# Patient Record
Sex: Male | Born: 1945 | Race: White | Hispanic: No | Marital: Single | Smoking: Former smoker
Health system: Southern US, Community
[De-identification: ages and names within clinical notes are randomized; demographics above are authoritative.]

## PROBLEM LIST (undated history)

## (undated) DIAGNOSIS — I4891 Unspecified atrial fibrillation: Secondary | ICD-10-CM

## (undated) DIAGNOSIS — E119 Type 2 diabetes mellitus without complications: Secondary | ICD-10-CM

## (undated) DIAGNOSIS — M109 Gout, unspecified: Secondary | ICD-10-CM

## (undated) DIAGNOSIS — E785 Hyperlipidemia, unspecified: Secondary | ICD-10-CM

## (undated) DIAGNOSIS — I639 Cerebral infarction, unspecified: Secondary | ICD-10-CM

## (undated) DIAGNOSIS — E669 Obesity, unspecified: Secondary | ICD-10-CM

## (undated) DIAGNOSIS — M1990 Unspecified osteoarthritis, unspecified site: Secondary | ICD-10-CM

## (undated) HISTORY — PX: ABDOMINAL SURGERY: SHX537

## (undated) HISTORY — DX: Unspecified atrial fibrillation: I48.91

## (undated) HISTORY — DX: Type 2 diabetes mellitus without complications: E11.9

## (undated) HISTORY — DX: Gout, unspecified: M10.9

## (undated) HISTORY — DX: Cerebral infarction, unspecified: I63.9

## (undated) HISTORY — DX: Hyperlipidemia, unspecified: E78.5

## (undated) HISTORY — DX: Unspecified osteoarthritis, unspecified site: M19.90

## (undated) HISTORY — DX: Obesity, unspecified: E66.9

## (undated) HISTORY — PX: ANKLE SURGERY: SHX546

---

## 2005-12-14 ENCOUNTER — Inpatient Hospital Stay (HOSPITAL_COMMUNITY): Admission: EM | Admit: 2005-12-14 | Discharge: 2005-12-19 | Payer: Self-pay | Admitting: Emergency Medicine

## 2011-07-22 ENCOUNTER — Ambulatory Visit (INDEPENDENT_AMBULATORY_CARE_PROVIDER_SITE_OTHER): Payer: Federal, State, Local not specified - PPO

## 2011-07-22 DIAGNOSIS — J209 Acute bronchitis, unspecified: Secondary | ICD-10-CM

## 2011-07-22 DIAGNOSIS — F411 Generalized anxiety disorder: Secondary | ICD-10-CM

## 2011-11-30 ENCOUNTER — Encounter: Payer: Self-pay | Admitting: Internal Medicine

## 2012-01-18 ENCOUNTER — Ambulatory Visit (INDEPENDENT_AMBULATORY_CARE_PROVIDER_SITE_OTHER): Payer: Federal, State, Local not specified - PPO | Admitting: Internal Medicine

## 2012-01-18 ENCOUNTER — Encounter: Payer: Self-pay | Admitting: Internal Medicine

## 2012-01-18 VITALS — BP 141/89 | HR 77 | Temp 98.3°F | Resp 16 | Ht 66.0 in | Wt 274.4 lb

## 2012-01-18 DIAGNOSIS — E785 Hyperlipidemia, unspecified: Secondary | ICD-10-CM

## 2012-01-18 DIAGNOSIS — Z0000 Encounter for general adult medical examination without abnormal findings: Secondary | ICD-10-CM

## 2012-01-18 DIAGNOSIS — M109 Gout, unspecified: Secondary | ICD-10-CM

## 2012-01-18 DIAGNOSIS — Z79899 Other long term (current) drug therapy: Secondary | ICD-10-CM

## 2012-01-18 DIAGNOSIS — E669 Obesity, unspecified: Secondary | ICD-10-CM

## 2012-01-18 DIAGNOSIS — G4700 Insomnia, unspecified: Secondary | ICD-10-CM

## 2012-01-18 LAB — POCT URINALYSIS DIPSTICK
Blood, UA: NEGATIVE
Protein, UA: NEGATIVE
Spec Grav, UA: 1.02
Urobilinogen, UA: 1
pH, UA: 5.5

## 2012-01-18 LAB — POCT UA - MICROSCOPIC ONLY
Casts, Ur, LPF, POC: NEGATIVE
Crystals, Ur, HPF, POC: NEGATIVE
Yeast, UA: NEGATIVE

## 2012-01-18 MED ORDER — ALPRAZOLAM 0.5 MG PO TABS: 0.5000 mg | ORAL_TABLET | Freq: Every evening | ORAL | Status: DC | PRN

## 2012-01-18 NOTE — Progress Notes (Signed)
  Subjective:    Patient ID: Aaron Moon, male    DOB: 1946-08-09, 66 y.o.   MRN: [id number]  HPI Doing well. Has pain in left leg from a strain left knee. Improving Lipids are ok and tolerates meds. Obesity no better. See scanned hx   Review of Systems See scanned ros    Objective:   Physical Exam  Constitutional: He is oriented to person, place, and time. He appears well-nourished. No distress.  HENT:  Right Ear: External ear normal.  Left Ear: External ear normal.  Nose: Nose normal.  Mouth/Throat: Oropharynx is clear and moist.  Eyes: EOM are normal. Pupils are equal, round, and reactive to light.  Neck: Normal range of motion. Neck supple. No thyromegaly present.  Cardiovascular: Normal rate and regular rhythm.   Pulmonary/Chest: Effort normal and breath sounds normal.  Abdominal: Bowel sounds are normal. He exhibits no mass. There is no tenderness.  Genitourinary: Prostate normal and penis normal.  Musculoskeletal: Normal range of motion. He exhibits tenderness.  Neurological: He is alert and oriented to person, place, and time. He has normal reflexes. Coordination normal.  Skin: Skin is warm. Rash noted.  Psychiatric: He has a normal mood and affect. His behavior is normal.      Results for orders placed in visit on 01/18/12  GLUCOSE, POCT (MANUAL RESULT ENTRY)      Component Value Range   POC Glucose 92  70 - 99 (mg/dl)  POCT GLYCOSYLATED HEMOGLOBIN (HGB A1C)      Component Value Range   Hemoglobin A1C 6.0    POCT UA - MICROSCOPIC ONLY      Component Value Range   WBC, Ur, HPF, POC 0-4     RBC, urine, microscopic 0-3     Bacteria, U Microscopic neg     Mucus, UA trace     Epithelial cells, urine per micros 0-tntc     Crystals, Ur, HPF, POC neg     Casts, Ur, LPF, POC neg     Yeast, UA neg    POCT URINALYSIS DIPSTICK      Component Value Range   Color, UA yellow     Clarity, UA clear     Glucose, UA neg     Bilirubin, UA neg     Ketones, UA neg     Spec Grav, UA 1.020     Blood, UA neg     pH, UA 5.5     Protein, UA neg     Urobilinogen, UA 1.0     Nitrite, UA neg     Leukocytes, UA Trace         Assessment & Plan:  RF meds 1 yr

## 2012-01-19 LAB — CBC WITH DIFFERENTIAL/PLATELET
Basophils Relative: 0 % (ref 0–1)
HCT: 41.5 % (ref 39.0–52.0)
MCH: 29.3 pg (ref 26.0–34.0)
MCHC: 33.7 g/dL (ref 30.0–36.0)
Monocytes Relative: 7 % (ref 3–12)
Neutrophils Relative %: 57 % (ref 43–77)
Platelets: 300 10*3/uL (ref 150–400)
RDW: 13.8 % (ref 11.5–15.5)
WBC: 12.3 10*3/uL — ABNORMAL HIGH (ref 4.0–10.5)

## 2012-01-19 LAB — TSH: TSH: 3.837 u[IU]/mL (ref 0.350–4.500)

## 2012-01-19 LAB — LIPID PANEL
Cholesterol: 131 mg/dL (ref 0–200)
LDL Cholesterol: 57 mg/dL (ref 0–99)
Triglycerides: 216 mg/dL — ABNORMAL HIGH (ref <150)
VLDL: 43 mg/dL — ABNORMAL HIGH (ref 0–40)

## 2012-01-19 LAB — COMPREHENSIVE METABOLIC PANEL
Alkaline Phosphatase: 55 U/L (ref 39–117)
Chloride: 104 mEq/L (ref 96–112)
Sodium: 140 mEq/L (ref 135–145)
Total Protein: 6.7 g/dL (ref 6.0–8.3)

## 2012-01-19 LAB — PSA: PSA: 0.48 ng/mL (ref <=4.00)

## 2012-01-25 ENCOUNTER — Encounter: Payer: Federal, State, Local not specified - PPO | Admitting: Internal Medicine

## 2012-04-21 ENCOUNTER — Ambulatory Visit: Payer: Federal, State, Local not specified - PPO

## 2012-04-21 ENCOUNTER — Ambulatory Visit (INDEPENDENT_AMBULATORY_CARE_PROVIDER_SITE_OTHER): Payer: Federal, State, Local not specified - PPO | Admitting: Family Medicine

## 2012-04-21 VITALS — BP 140/98 | HR 79 | Temp 98.2°F | Resp 18 | Ht 66.25 in | Wt 284.0 lb

## 2012-04-21 DIAGNOSIS — M25569 Pain in unspecified knee: Secondary | ICD-10-CM

## 2012-04-21 DIAGNOSIS — M1990 Unspecified osteoarthritis, unspecified site: Secondary | ICD-10-CM

## 2012-04-21 MED ORDER — MELOXICAM 7.5 MG PO TABS: 7.5000 mg | ORAL_TABLET | Freq: Every day | ORAL | Status: AC

## 2012-04-21 NOTE — Patient Instructions (Addendum)
Baker's Cyst  A Baker's cyst is a swelling that forms in the back of the knee. It is a sac-like structure. It is filled with the same fluid that is located in your knee. The fluid located in your knee is necessary because it lubricates the bones and cartilage. It allows them to move over each other more easily.  CAUSES   When the knee becomes injured or has soreness (inflammation) present, more fluid forms in the knee. When this happens, the joint lining is pushed out behind the knee and forms the baker's cyst. This cyst may also be caused by inflammation from arthritic conditions and infections.  DIAGNOSIS   A Baker's cyst is most often diagnosed with an ultrasound. This is a specialized picture (like an X-ray). It shows a picture by using sound waves. Sometimes a specialized x-ray called an MRI (magnetic resonance imaging) is used. This picks up other problems within a joint if an ultrasound alone cannot make the diagnosis. If the cyst came immediately following an injury, plain x-rays may be used to make a diagnosis.  TREATMENT   The treatment depends on the cause of the cyst. But most of these cysts are caused by an inflammation. Anti-inflammatory medications and rest often will get rid of the problem. If the cyst is caused by an infection, medications (antibiotics) will be prescribed to help this. Take the medications as directed. Refer to Home Care Instructions, below, for additional treatment suggestions.  HOME CARE INSTRUCTIONS    If the cyst was caused by an injury, for the first 24 hours, while lying down, keep the injured extremity elevated on 2 pillows.   For the first 24 hours while you are awake, apply ice bags (ice in a plastic bag with a towel around it to prevent frostbite to skin) 3 to 4 times per day for 15 to 20 minutes to the injured area. Then do as directed by your caregiver.   Only take over-the-counter or prescription medicines for pain, discomfort, or fever as directed by your  caregiver.  Persistent pain and inability to use the injured area for more than 2 to 3 days are warning signs indicating that you should see a caregiver for a follow-up visit as soon as possible. Persistent pain and swelling indicate that further evaluation, non-weight bearing (use of crutches as instructed), and/or further x-rays are needed. Make a follow-up appointment with your own caregiver.  If conservative measures (rest, medications and inactivity) do not help the problem get better, sometimes surgery for removal of the cyst is needed. Reasons for this may be that the cyst is pressing on nerves and/or vessels and causing problems which cannot wait for improvement with conservative treatment. If the problem is caused by injuries to the cartilage in the knee, surgery is often needed for treatment of that problem.  MAKE SURE YOU:    Understand these instructions.   Will watch your condition.   Will get help right away if you are not doing well or get worse.  Document Released: 07/27/2005 Document Revised: 07/16/2011 Document Reviewed: 03/14/2008  [employer] Patient Information 2012 [employer].

## 2012-04-21 NOTE — Progress Notes (Signed)
 [facility]:  Office Visit  Chief Complaint:  Chief Complaint  Patient presents with  . Knee Pain    left, x 6 weeks    HPI: Aaron Moon is a 66 y.o. male who complains of  Left posterior knee soreness and sharp anterior knee. No pop or click, Possible buckling and knee giving out sensation only when walking. HE thought walking would help it. He walks back and forth from his car. Tried Aleve, Ibuprofen without relief. Denies injury. No prior surgery, no weakness or numbness  Past Medical History  Diagnosis Date  . Hyperlipidemia   . Obesity   . Gout    No past surgical history on file. History   Social History  . Marital Status: Single    Spouse Name: N/A    Number of Children: N/A  . Years of Education: N/A   Social History Main Topics  . Smoking status: Current Some Day Smoker  . Smokeless tobacco: None  . Alcohol Use: Yes  . Drug Use: No  . Sexually Active: None   Other Topics Concern  . None   Social History Narrative  . None   No family history on file. No Known Allergies Prior to Admission medications   Medication Sig Start Date End Date Taking? Authorizing Provider  allopurinol (ZYLOPRIM) 100 MG tablet Take 100 mg by mouth daily.   Yes Historical Provider, MD  ALPRAZolam Prudy Feeler) 0.5 MG tablet Take 1 tablet (0.5 mg total) by mouth at bedtime as needed. 01/18/12  Yes Jonita Albee, MD  aspirin 325 MG tablet Take 325 mg by mouth daily.   Yes Historical Provider, MD  fish oil-omega-3 fatty acids 1000 MG capsule Take 2 g by mouth daily.   Yes Historical Provider, MD  omeprazole (PRILOSEC) 20 MG capsule Take 20 mg by mouth daily.   Yes Historical Provider, MD  simvastatin (ZOCOR) 40 MG tablet Take 40 mg by mouth every evening.   Yes Historical Provider, MD     ROS: The patient denies fevers, chills, night sweats, unintentional weight loss, chest pain, palpitations, wheezing, dyspnea on exertion, nausea, vomiting, abdominal pain, dysuria,  hematuria, melena, numbness, weakness, or tingling.   All other systems have been reviewed and were otherwise negative with the exception of those mentioned in the HPI and as above.    PHYSICAL EXAM: Filed Vitals:   04/21/12 1731  BP: 140/98  Pulse: 79  Temp: 98.2 F (36.8 C)  Resp: 18   Filed Vitals:   04/21/12 1731  Height: 5' 6.25" (1.683 m)  Weight: 284 lb (128.822 kg)   Body mass index is 45.49 kg/(m^2).  General: Alert, no acute distress, morbidly obese HEENT:  Normocephalic, atraumatic, oropharynx patent.  Cardiovascular:  Regular rate and rhythm, no rubs murmurs or gallops.  No Carotid bruits, radial pulse intact. No pedal edema.  Respiratory: Clear to auscultation bilaterally.  No wheezes, rales, or rhonchi.  No cyanosis, no use of accessory musculature GI: No organomegaly, abdomen is soft and non-tender, positive bowel sounds.  No masses. Skin: No rashes. Neurologic: Facial musculature symmetric. Psychiatric: Patient is appropriate throughout our interaction. Lymphatic: No cervical lymphadenopathy Musculoskeletal: Gait intact. Knee-+ crepitus, no bony deformities, ROM  And sensation intact, + swelling posterior knee but not in calf, 5/5 strength, 2/2 DTR   LABS: Results for Moon placed in visit on 01/18/12  GLUCOSE, POCT (MANUAL RESULT ENTRY)      Component Value Range   POC Glucose 92  70 -  99 mg/dl  POCT GLYCOSYLATED HEMOGLOBIN (HGB A1C)      Component Value Range   Hemoglobin A1C 6.0    POCT UA - MICROSCOPIC ONLY      Component Value Range   WBC, Ur, HPF, POC 0-4     RBC, urine, microscopic 0-3     Bacteria, U Microscopic neg     Mucus, UA trace     Epithelial cells, urine per micros 0-tntc     Crystals, Ur, HPF, POC neg     Casts, Ur, LPF, POC neg     Yeast, UA neg    POCT URINALYSIS DIPSTICK      Component Value Range   Color, UA yellow     Clarity, UA clear     Glucose, UA neg     Bilirubin, UA neg     Ketones, UA neg     Spec Grav, UA 1.020      Blood, UA neg     pH, UA 5.5     Protein, UA neg     Urobilinogen, UA 1.0     Nitrite, UA neg     Leukocytes, UA Trace    COMPREHENSIVE METABOLIC PANEL      Component Value Range   Sodium 140  135 - 145 mEq/L   Potassium 4.6  3.5 - 5.3 mEq/L   Chloride 104  96 - 112 mEq/L   CO2 24  19 - 32 mEq/L   Glucose, Bld 101 (*) 70 - 99 mg/dL   BUN 14  6 - 23 mg/dL   Creat 1.61  0.96 - 0.45 mg/dL   Total Bilirubin 0.4  0.3 - 1.2 mg/dL   Alkaline Phosphatase 55  39 - 117 U/L   AST 42 (*) 0 - 37 U/L   ALT 41  0 - 53 U/L   Total Protein 6.7  6.0 - 8.3 g/dL   Albumin 4.0  3.5 - 5.2 g/dL   Calcium 9.1  8.4 - 40.9 mg/dL  CBC WITH DIFFERENTIAL      Component Value Range   WBC 12.3 (*) 4.0 - 10.5 K/uL   RBC 4.78  4.22 - 5.81 MIL/uL   Hemoglobin 14.0  13.0 - 17.0 g/dL   HCT 81.1  91.4 - 78.2 %   MCV 86.8  78.0 - 100.0 fL   MCH 29.3  26.0 - 34.0 pg   MCHC 33.7  30.0 - 36.0 g/dL   RDW 95.6  21.3 - 08.6 %   Platelets 300  150 - 400 K/uL   Neutrophils Relative 57  43 - 77 %   Neutro Abs 7.0  1.7 - 7.7 K/uL   Lymphocytes Relative 34  12 - 46 %   Lymphs Abs 4.1 (*) 0.7 - 4.0 K/uL   Monocytes Relative 7  3 - 12 %   Monocytes Absolute 0.9  0.1 - 1.0 K/uL   Eosinophils Relative 2  0 - 5 %   Eosinophils Absolute 0.2  0.0 - 0.7 K/uL   Basophils Relative 0  0 - 1 %   Basophils Absolute 0.0  0.0 - 0.1 K/uL   Smear Review Criteria for review not met    TSH      Component Value Range   TSH 3.837  0.350 - 4.500 uIU/mL  PSA      Component Value Range   PSA 0.48  <=4.00 ng/mL  LIPID PANEL      Component Value Range   Cholesterol 131  0 - 200 mg/dL   Triglycerides 595 (*) <150 mg/dL   HDL 31 (*) >63 mg/dL   Total CHOL/HDL Ratio 4.2     VLDL 43 (*) 0 - 40 mg/dL   LDL Cholesterol 57  0 - 99 mg/dL     EKG/XRAY:   Primary read interpreted by Dr[facility] Con[facility] Rolls at [facility]. Left knee-DJD, posterior osteophytes, ? Soft tissue swelling in back   ASSESSMENT/PLAN: Encounter Diagnoses  Name Primary?  . Knee  pain Yes  . Arthritis    Morbidly obese male with left posterior knee pain -arthritis vs ? Popliteal cyst. I prescribed him Mobic and see how he does. If he still has pain then ay consider Doppler to determine if truly popliteal cyst    ,  PHUONG, DO 04/22/2012 9:01 AM

## 2012-05-25 ENCOUNTER — Telehealth: Payer: Self-pay

## 2012-05-25 NOTE — Telephone Encounter (Signed)
The patient called to report that he when he took the x-ray CD given to him at [facility] to the [place]- they were unable to use it.  [facility] in a [facility] format.  X-rays were done on 04/21/12.  The patient may be reached at [phone].

## 2012-08-15 ENCOUNTER — Encounter: Payer: Self-pay | Admitting: Internal Medicine

## 2012-08-15 ENCOUNTER — Ambulatory Visit (INDEPENDENT_AMBULATORY_CARE_PROVIDER_SITE_OTHER): Payer: Federal, State, Local not specified - PPO | Admitting: Internal Medicine

## 2012-08-15 VITALS — BP 121/83 | HR 79 | Temp 98.0°F | Resp 16 | Ht 66.5 in | Wt 290.0 lb

## 2012-08-15 DIAGNOSIS — Z5181 Encounter for therapeutic drug level monitoring: Secondary | ICD-10-CM

## 2012-08-15 DIAGNOSIS — M109 Gout, unspecified: Secondary | ICD-10-CM

## 2012-08-15 DIAGNOSIS — I10 Essential (primary) hypertension: Secondary | ICD-10-CM

## 2012-08-15 DIAGNOSIS — E789 Disorder of lipoprotein metabolism, unspecified: Secondary | ICD-10-CM

## 2012-08-15 DIAGNOSIS — Z79899 Other long term (current) drug therapy: Secondary | ICD-10-CM

## 2012-08-15 NOTE — Progress Notes (Signed)
  Subjective:    Patient ID: Aaron Moon, male    DOB: [birth year], 67 y.o.   MRN: [id number]  HPI Feels good , agrees to quit smoking. Tolerates meds. No gout attacks Obese, stressed exercise.   Review of Systems Unchanged/all working nl for him    Objective:   Physical Exam Lungs clear/heat normal/belly soft       Assessment & Plan:  Refill meds 1 year/no changes

## 2012-08-15 NOTE — Patient Instructions (Signed)
Calorie Counting Diet A calorie counting diet requires you to eat the number of calories that are right for you in a day. Calories are the measurement of how much energy you get from the food you eat. Eating the right amount of calories is important for staying at a healthy weight. If you eat too many calories, your body will store them as fat and you may gain weight. If you eat too few calories, you may lose weight. Counting the number of calories you eat during a day will help you know if you are eating the right amount. A Registered Dietitian can determine how many calories you need in a day. The amount of calories needed varies from person to person. If your goal is to lose weight, you will need to eat fewer calories. Losing weight can benefit you if you are overweight or have health problems such as heart disease, high blood pressure, or diabetes. If your goal is to gain weight, you will need to eat more calories. Gaining weight may be necessary if you have a certain health problem that causes your body to need more energy. TIPS Whether you are increasing or decreasing the number of calories you eat during a day, it may be hard to get used to changes in what you eat and drink. The following are tips to help you keep track of the number of calories you eat.  Measure foods at home with measuring cups. This helps you know the amount of food and number of calories you are eating.  Restaurants often serve food in amounts that are larger than 1 serving. While eating out, estimate how many servings of a food you are given. For example, a serving of cooked rice is  cup or about the size of half of a fist. Knowing serving sizes will help you be aware of how much food you are eating at restaurants.  Ask for smaller portion sizes or child-size portions at restaurants.  Plan to eat half of a meal at a restaurant. Take the rest home or share the other half with a friend.  Read the Nutrition Facts panel on  food labels for calorie content and serving size. You can find out how many servings are in a package, the size of a serving, and the number of calories each serving has.  For example, a package might contain 3 cookies. The Nutrition Facts panel on that package says that 1 serving is 1 cookie. Below that, it will say there are 3 servings in the container. The calories section of the Nutrition Facts label says there are 90 calories. This means there are 90 calories in 1 cookie (1 serving). If you eat 1 cookie you have eaten 90 calories. If you eat all 3 cookies, you have eaten 270 calories (3 servings x 90 calories = 270 calories). The list below tells you how big or small some common portion sizes are.  1 oz.........4 stacked dice.  3 oz.........Deck of cards.  1 tsp........Tip of little finger.  1 tbs........Thumb.  2 tbs........Golf ball.   cup.......Half of a fist.  1 cup........A fist. KEEP A FOOD LOG Write down every food item you eat, the amount you eat, and the number of calories in each food you eat during the day. At the end of the day, you can add up the total number of calories you have eaten. It may help to keep a list like the one below. Find out the calorie information by reading the   Nutrition Facts panel on food labels. Breakfast  Bran cereal (1 cup, 110 calories).  Fat-free milk ( cup, 45 calories). Snack  Apple (1 medium, 80 calories). Lunch  Spinach (1 cup, 20 calories).  Tomato ( medium, 20 calories).  Chicken breast strips (3 oz, 165 calories).  Shredded cheddar cheese ( cup, 110 calories).  Light Italian dressing (2 tbs, 60 calories).  Whole-wheat bread (1 slice, 80 calories).  Tub margarine (1 tsp, 35 calories).  Vegetable soup (1 cup, 160 calories). Dinner  Pork chop (3 oz, 190 calories).  Brown rice (1 cup, 215 calories).  Steamed broccoli ( cup, 20 calories).  Strawberries (1  cup, 65 calories).  Whipped cream (1 tbs, 50  calories). Daily Calorie Total: 1425 Document Released: 07/27/2005 Document Revised: 10/19/2011 Document Reviewed: 01/21/2007 [employer] Patient Information 2013 [employer].  

## 2012-09-23 ENCOUNTER — Ambulatory Visit (INDEPENDENT_AMBULATORY_CARE_PROVIDER_SITE_OTHER): Payer: Federal, State, Local not specified - PPO | Admitting: Emergency Medicine

## 2012-09-23 VITALS — BP 129/76 | HR 88 | Temp 97.9°F | Resp 16 | Ht 67.0 in | Wt 284.4 lb

## 2012-09-23 DIAGNOSIS — J209 Acute bronchitis, unspecified: Secondary | ICD-10-CM

## 2012-09-23 MED ORDER — PSEUDOEPHEDRINE-GUAIFENESIN ER 60-600 MG PO TB12: 1.0000 | ORAL_TABLET | Freq: Two times a day (BID) | ORAL | Status: AC

## 2012-09-23 MED ORDER — HYDROCOD POLST-CHLORPHEN POLST 10-8 MG/5ML PO LQCR: 5.0000 mL | Freq: Two times a day (BID) | ORAL | Status: DC | PRN

## 2012-09-23 MED ORDER — AZITHROMYCIN 250 MG PO TABS: ORAL_TABLET | ORAL | Status: DC

## 2012-09-23 NOTE — Progress Notes (Signed)
[facility] [address] [place] [address] [phone]  Date:  09/23/2012   Name:  Aaron Moon   DOB:  12/10/1945   MRN:  [id number]  PCP:  Tally Due, MD    Chief Complaint: Generalized Body Aches, Cough and chest congestion   History of Present Illness:  Aaron Moon is a 67 y.o. very pleasant male patient who presents with the following:  Has been ill 2 days with a cough and nasal congestion.  No drainage.  Cough is mostly not productive.  Spent last two days bedridden with malaise and fatigue.  No fever or chills.  Says he had poor appetite.  No wheezing or shortness of breath.  No improvement with home treatment.  No sick contacts.    Patient Active Problem List  Diagnosis  . Hyperlipidemia  . Obesity    Past Medical History  Diagnosis Date  . Hyperlipidemia   . Obesity   . Gout     History reviewed. No pertinent past surgical history.  History  Substance Use Topics  . Smoking status: Current Some Day Smoker  . Smokeless tobacco: Not on file  . Alcohol Use: Yes    History reviewed. No pertinent family history.  No Known Allergies  Medication list has been reviewed and updated.  Current Outpatient Prescriptions on File Prior to Visit  Medication Sig Dispense Refill  . allopurinol (ZYLOPRIM) 100 MG tablet Take 100 mg by mouth daily.      Marland Kitchen ALPRAZolam (XANAX) 0.5 MG tablet Take 1 tablet (0.5 mg total) by mouth at bedtime as needed.  90 tablet  3  . aspirin 325 MG tablet Take 325 mg by mouth daily.      . cholecalciferol (VITAMIN D) 1000 UNITS tablet Take 1,000 Units by mouth daily.      . fish oil-omega-3 fatty acids 1000 MG capsule Take 2 g by mouth daily.      Marland Kitchen omeprazole (PRILOSEC) 20 MG capsule Take 20 mg by mouth daily.      . simvastatin (ZOCOR) 40 MG tablet Take 40 mg by mouth every evening.      . meloxicam (MOBIC) 7.5 MG tablet Take 1 tablet (7.5 mg total) by mouth daily.  30 tablet  0   No current  facility-administered medications on file prior to visit.    Review of Systems:  As per HPI, otherwise negative.    Physical Examination: Filed Vitals:   09/23/12 1818  BP: 129/76  Pulse: 88  Temp: 97.9 F (36.6 C)  Resp: 16   Filed Vitals:   09/23/12 1818  Height: 5\' 7"  (1.702 m)  Weight: 284 lb 6.4 oz (129.003 kg)   Body mass index is 44.53 kg/(m^2). Ideal Body Weight: Weight in (lb) to have BMI = 25: 159.3  GEN: morbidly obese, NAD, Non-toxic, A & O x 3 HEENT: Atraumatic, Normocephalic. Neck supple. No masses, No LAD. Ears and Nose: No external deformity. CV: RRR, No M/G/R. No JVD. No thrill. No extra heart sounds. PULM: CTA B, no wheezes, crackles, rhonchi. No retractions. No resp. distress. No accessory muscle use. ABD: S, NT, ND, +BS. No rebound. No HSM. EXTR: No c/c/e NEURO Normal gait.  PSYCH: Normally interactive. Conversant. Not depressed or anxious appearing.  Calm demeanor.    Assessment and Plan: Bronchitis mucinex d tussionex zpak   Carmelina Dane, MD

## 2012-09-23 NOTE — Patient Instructions (Signed)

## 2012-09-28 ENCOUNTER — Ambulatory Visit: Payer: Federal, State, Local not specified - PPO

## 2012-09-28 ENCOUNTER — Ambulatory Visit (INDEPENDENT_AMBULATORY_CARE_PROVIDER_SITE_OTHER): Payer: Federal, State, Local not specified - PPO | Admitting: Family Medicine

## 2012-09-28 VITALS — BP 129/83 | HR 75 | Temp 97.6°F | Resp 20 | Ht 66.0 in | Wt 280.2 lb

## 2012-09-28 DIAGNOSIS — G4700 Insomnia, unspecified: Secondary | ICD-10-CM

## 2012-09-28 DIAGNOSIS — Z720 Tobacco use: Secondary | ICD-10-CM

## 2012-09-28 DIAGNOSIS — J209 Acute bronchitis, unspecified: Secondary | ICD-10-CM

## 2012-09-28 DIAGNOSIS — J45909 Unspecified asthma, uncomplicated: Secondary | ICD-10-CM

## 2012-09-28 DIAGNOSIS — G479 Sleep disorder, unspecified: Secondary | ICD-10-CM

## 2012-09-28 LAB — POCT CBC
Granulocyte percent: 32.8 %G — AB (ref 37–80)
HCT, POC: 41.7 % — AB (ref 43.5–53.7)
Hemoglobin: 13.4 g/dL — AB (ref 14.1–18.1)
MPV: 9.9 fL (ref 0–99.8)
WBC: 6.4 10*3/uL (ref 4.6–10.2)

## 2012-09-28 MED ORDER — ALBUTEROL SULFATE HFA 108 (90 BASE) MCG/ACT IN AERS: 2.0000 | INHALATION_SPRAY | Freq: Four times a day (QID) | RESPIRATORY_TRACT | Status: DC | PRN

## 2012-09-28 MED ORDER — PREDNISONE 10 MG PO TABS: ORAL_TABLET | ORAL | Status: DC

## 2012-09-28 MED ORDER — ALPRAZOLAM 0.5 MG PO TABS: 0.5000 mg | ORAL_TABLET | Freq: Every evening | ORAL | Status: DC | PRN

## 2012-09-28 MED ORDER — HYDROCOD POLST-CHLORPHEN POLST 10-8 MG/5ML PO LQCR: 5.0000 mL | Freq: Two times a day (BID) | ORAL | Status: DC | PRN

## 2012-09-28 NOTE — Patient Instructions (Addendum)
Use the inhaler 2 puffs 4 times daily as directed  Take the prednisone 3 pills daily for 2 days, then 2 daily for 2 days, then one daily for 2 days.  Drink plenty of fluids  Begin focusing hard on a quit date for stopping smoking completely.  Return if not improving

## 2012-09-28 NOTE — Progress Notes (Signed)
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 He was seen last week Dr. Dareen Piano and treated with antibiotics, Zithromax. He finished his last pill yesterday. He continues to cough and wheeze. He has worked the last couple of days. He smokes about a half-pack of cigarettes a day. We discussed his need for quitting. He has felt a little sweaty at times, but no documented fevers. The coughing has not been keeping him awake very much at night. He does have some phlegm.  Objective: Obese white male in no major distress. His TMs are normal. Throat clear. Neck supple without nodes thyromegaly. Chest has end-expiratory wheezing. He has a few scattered rhonchi. Heart was regular without murmurs.  Assessment: Cough Wheezing Tobacco use  Plan: We'll check a CBC and chest x-ray and decide treatment accordingly  Results for orders placed in visit on 09/28/12  POCT CBC      Result Value Range   WBC 6.4  4.6 - 10.2 K/uL   Lymph, poc 3.7 (*) 0.6 - 3.4   POC LYMPH PERCENT 58.2 (*) 10 - 50 %L   MID (cbc) 0.6  0 - 0.9   POC MID % 9.0  0 - 12 %M   POC Granulocyte 2.1  2 - 6.9   Granulocyte percent 32.8 (*) 37 - 80 %G   RBC 4.70  4.69 - 6.13 M/uL   Hemoglobin 13.4 (*) 14.1 - 18.1 g/dL   HCT, POC 16.1 (*) 09.6 - 53.7 %   MCV 88.7  80 - 97 fL   MCH, POC 28.5  27 - 31.2 pg   MCHC 32.1  31.8 - 35.4 g/dL   RDW, POC 04.5     Platelet Count, POC 207  142 - 424 K/uL   MPV 9.9  0 - 99.8 fL   [facility] reading (PRIMARY) by  Dr. Alwyn Ren Increased bronchovascular markings.  Prednisone, inhaler

## 2013-02-13 ENCOUNTER — Ambulatory Visit: Payer: Federal, State, Local not specified - PPO | Admitting: Internal Medicine

## 2013-02-13 ENCOUNTER — Ambulatory Visit (INDEPENDENT_AMBULATORY_CARE_PROVIDER_SITE_OTHER): Payer: Federal, State, Local not specified - PPO | Admitting: Internal Medicine

## 2013-02-13 ENCOUNTER — Encounter: Payer: Self-pay | Admitting: Internal Medicine

## 2013-02-13 VITALS — BP 141/86 | HR 76 | Temp 97.3°F | Resp 18 | Ht 65.5 in | Wt 290.0 lb

## 2013-02-13 DIAGNOSIS — E782 Mixed hyperlipidemia: Secondary | ICD-10-CM

## 2013-02-13 DIAGNOSIS — M109 Gout, unspecified: Secondary | ICD-10-CM | POA: Insufficient documentation

## 2013-02-13 DIAGNOSIS — E669 Obesity, unspecified: Secondary | ICD-10-CM

## 2013-02-13 LAB — CBC WITH DIFFERENTIAL/PLATELET
Basophils Absolute: 0.1 10*3/uL (ref 0.0–0.1)
Basophils Relative: 1 % (ref 0–1)
Eosinophils Absolute: 0.2 10*3/uL (ref 0.0–0.7)
Eosinophils Relative: 2 % (ref 0–5)
MCHC: 34 g/dL (ref 30.0–36.0)
Monocytes Absolute: 0.9 10*3/uL (ref 0.1–1.0)
Monocytes Relative: 8 % (ref 3–12)
Neutrophils Relative %: 63 % (ref 43–77)
Platelets: 282 10*3/uL (ref 150–400)
RDW: 14.1 % (ref 11.5–15.5)
WBC: 11 10*3/uL — ABNORMAL HIGH (ref 4.0–10.5)

## 2013-02-13 LAB — POCT URINALYSIS DIPSTICK
Blood, UA: NEGATIVE
Glucose, UA: NEGATIVE
Leukocytes, UA: NEGATIVE
Nitrite, UA: NEGATIVE
Protein, UA: NEGATIVE
pH, UA: 5.5

## 2013-02-13 LAB — COMPREHENSIVE METABOLIC PANEL
Alkaline Phosphatase: 54 U/L (ref 39–117)
Calcium: 9.5 mg/dL (ref 8.4–10.5)
Chloride: 99 mEq/L (ref 96–112)
Creat: 1.12 mg/dL (ref 0.50–1.35)
Glucose, Bld: 99 mg/dL (ref 70–99)
Total Protein: 6.7 g/dL (ref 6.0–8.3)

## 2013-02-13 NOTE — Progress Notes (Signed)
  Subjective:    Patient ID: Aaron Moon, male    DOB: [birth year], 67 y.o.   MRN: [id number]  HPI Obesity unchanged Gout is stable,controlled. Lipids controlled/ Arthritis/walking chronic pain [facility].  Review of Systems unchanged    Objective:   Physical Exam  Vitals reviewed. Constitutional: He is oriented to person, place, and time. He appears well-nourished. No distress.  HENT:  Right Ear: External ear normal.  Left Ear: External ear normal.  Nose: Nose normal.  Mouth/Throat: Oropharynx is clear and moist.  Neck: Neck supple.  Cardiovascular: Normal rate, regular rhythm, normal heart sounds and intact distal pulses.   No murmur heard. Pulmonary/Chest: Effort normal and breath sounds normal.  Abdominal: Soft. He exhibits no distension and no mass. There is no tenderness.  Musculoskeletal: He exhibits tenderness.  Neurological: He is alert and oriented to person, place, and time. He has normal reflexes. No cranial nerve deficit. He exhibits normal muscle tone. Coordination normal.  Skin: No rash noted.  Psychiatric: He has a normal mood and affect. His behavior is normal.   Results for orders placed in visit on 02/13/13  POCT URINALYSIS DIPSTICK      Result Value Range   Color, UA yellow     Clarity, UA clear     Glucose, UA neg     Bilirubin, UA neg     Ketones, UA neg     Spec Grav, UA 1.010     Blood, UA neg     pH, UA 5.5     Protein, UA neg     Urobilinogen, UA 0.2     Nitrite, UA neg     Leukocytes, UA Negative            Assessment & Plan:  Doing well/RF meds 6 mo

## 2013-02-13 NOTE — Patient Instructions (Signed)
2000 Calorie Diabetic Diet The 2000 calorie diabetic diet is designed for eating up to 2000 calories each day. Following this diet and making healthy meal choices can help improve overall health. It controls blood glucose (sugar) levels. It can also lower blood pressure and cholesterol. SERVING SIZES Measuring foods and serving sizes helps to make sure you are getting the right amount of food. The list below tells how big or small some common serving sizes are.  1 oz.........4 stacked dice.  3 oz.........Deck of cards.  1 tsp........Tip of little finger.  1 tbs........Thumb.  2 tbs........Golf ball.   cup.......Half of a fist.  1 cup........A fist. GUIDELINES FOR CHOOSING FOODS The goal of this diet is to eat a variety of foods and limit calories to 2000 each day. This can be done by choosing foods that are low in calories and fat. The diet also suggests eating small amounts of food often. Doing this helps control your blood glucose levels so they do not get too high or too low. Each meal or snack should contain a protein food source to help you feel more satisfied and to stabilize your blood glucose. Try to eat about the same amount of food around the same time each day. This includes weekend days, travel days, and days off work. Space your meals about 4 to 5 hours apart and add a snack between them if you wish. For example, a daily food plan could include breakfast, a morning snack, lunch, dinner, and an evening snack. Healthy meals and snacks include whole grains, vegetables, fruits, lean meats, poultry, fish, and dairy products. As you plan your meals, choose a variety of foods. Choose from the bread and starches, vegetables, fruit, dairy, and meat/protein groups. Examples of foods from each group are listed below with their suggested serving sizes. Use measuring cups and spoons to become familiar with what a healthy portion looks like. Bread and Starches Each serving equals 15 grams of  carbohydrates.  1 slice bread.   bagel.   cup or 1 cup cold cereal (unsweetened).   cup hot cereal or mashed potatoes.  1 small potato (size of a computer mouse).   cup cooked pasta or rice.   English muffin.  1 cup broth-based soup.  3 cups popcorn.  4 to 6 whole-wheat crackers.   cup cooked beans, peas, or corn. Vegetables Each serving equals 5 grams of carbohydrates.   cup cooked vegetables.  1 cup raw vegetables.   cup tomato juice. Fruit Each serving equals 15 grams of carbohydrates.  1 small apple, banana, or orange.  1  cup watermelon or strawberries.   cup applesauce (no sugar added).  2 tbs raisins.   banana.   cup unsweetened canned fruit.   cup unsweetened fruit juice. Dairy Each serving equals 12 to 15 grams of carbohydrates.  1 cup fat-free milk.  6 oz artificially sweetened yogurt.  1 cup buttermilk.  1 cup soy milk. Meat/Protein  1 large egg.  2 to 3 oz meat, poultry, or fish.   cup cottage cheese.  1 tbs peanut butter.   cup tofu.  1 oz cheese.   cup tuna canned in water. SAMPLE 2000 CALORIE DIET PLAN Breakfast  1 English muffin (2 carb servings).  Reduced fat cream cheese, 1 tbs.  1 scrambled egg.   grapefruit (1 carb serving).  Fat-free milk, 1 cup (1 carb serving). Morning Snack  Artificially sweetened yogurt, 6 oz (1 carb serving).  2 tbs chopped nuts.  1   small peach (1 carb serving). Lunch  Grilled chicken sandwich.  1 hamburger bun (2 carb servings).  2 oz chicken breast.  1 lettuce leaf.  2 slices tomato.  Reduced fat mayonnaise, 1 tbs.  Carrot sticks, 1 cup.  Celery, 1 cup.  1 small apple (1 carb serving).  Fat-free milk, 1 cup (1 carb serving). Afternoon Snack   cup low-fat cottage cheese.  1  cups strawberries (1 carb serving). Dinner  Steak fajitas.  2 oz lean steak.  1 whole-wheat tortilla, 8 inches (1  carb servings).  Shredded lettuce,   cup.  2 slices tomato.  Salsa,  cup.  Low-fat sour cream, 2 tbs.  Brown rice,  cup (1 carb serving).  1 small orange (1 carb serving). Evening Snack  4 reduced fat whole-wheat crackers (1 carb serving).  1 tbs peanut butter.  12 to 15 grapes (1 carb serving). MEAL PLAN Use this worksheet to help you make a daily meal plan based on the 2000 calorie diabetic diet suggestions. The total amount of carbohydrates in your meal or snack is more important than making sure you include all of the food groups at every meal or snack. If you are using this plan to help you control your blood glucose, you may interchange carbohydrate containing foods (dairy, starches, and fruits). Choose a variety of fresh foods of varying colors and flavors. You can choose from the following foods to build your day's meals:  11 Starches.  4 Vegetables.  3 Fruits.  3 Dairy.  8 oz Meat.  Up to 6 Fats. Your dietician can use this worksheet to help you decide how many servings and what types of foods are right for you. BREAKFAST Food Group and Servings / Food Choice Starches ___________________________________________ Dairy ______________________________________________ Fruit ______________________________________________ Meat ______________________________________________ Fat________________________________________________ LUNCH Food Group and Servings / Food Choice Starch _____________________________________________ Meat ______________________________________________ Vegetables _________________________________________ Fruit ______________________________________________ Dairy______________________________________________ Fat________________________________________________ AFTERNOON SNACK Food Group and Servings / Food  Choice Starch________________________________________________ Meat_________________________________________________ Fruit__________________________________________________ DINNER Food Group and Servings / Food Choice Starches ____________________________________________ Meat _______________________________________________ Dairy _______________________________________________ Vegetables __________________________________________ Fruit ________________________________________________ Fat_________________________________________________ EVENING SNACK Food Group and Servings / Food Choice Fruit _______________________________________________ Meat _______________________________________________ Starch ______________________________________________ DAILY TOTALS Starches ________________________ Vegetables ______________________ Fruit ___________________________ Dairy ___________________________ Meat ___________________________ Fat _____________________________ Document Released: 02/16/2005 Document Revised: 10/19/2011 Document Reviewed: 03/04/2009 [employer] Patient Information 2014 [employer]  

## 2013-02-13 NOTE — Progress Notes (Signed)
Per Dr. Perrin M[place]ese patient given 2 Tylenol ES 500  Mg po here in office at 8:50 am.  W.Robinson

## 2013-02-15 ENCOUNTER — Encounter: Payer: Self-pay | Admitting: Family Medicine

## 2013-05-15 ENCOUNTER — Encounter: Payer: Federal, State, Local not specified - PPO | Admitting: Internal Medicine

## 2013-05-25 NOTE — Progress Notes (Signed)
  Subjective:    Patient ID: Aaron Moon, male    DOB: 1945-12-03, 67 y.o.   MRN: [id number]  HPI Pt left unseen  Review of Systems     Objective:   Physical Exam        Assessment & Plan:

## 2013-08-21 ENCOUNTER — Ambulatory Visit: Payer: Federal, State, Local not specified - PPO | Admitting: Internal Medicine

## 2013-08-21 ENCOUNTER — Encounter: Payer: Federal, State, Local not specified - PPO | Admitting: Internal Medicine

## 2014-02-19 ENCOUNTER — Ambulatory Visit (INDEPENDENT_AMBULATORY_CARE_PROVIDER_SITE_OTHER): Payer: Federal, State, Local not specified - PPO

## 2014-02-19 ENCOUNTER — Ambulatory Visit (INDEPENDENT_AMBULATORY_CARE_PROVIDER_SITE_OTHER): Payer: Federal, State, Local not specified - PPO | Admitting: Internal Medicine

## 2014-02-19 VITALS — BP 126/84 | HR 96 | Temp 97.9°F | Resp 16 | Ht 65.75 in | Wt 277.2 lb

## 2014-02-19 DIAGNOSIS — Z23 Encounter for immunization: Secondary | ICD-10-CM

## 2014-02-19 DIAGNOSIS — M79609 Pain in unspecified limb: Secondary | ICD-10-CM

## 2014-02-19 DIAGNOSIS — K219 Gastro-esophageal reflux disease without esophagitis: Secondary | ICD-10-CM

## 2014-02-19 DIAGNOSIS — M2012 Hallux valgus (acquired), left foot: Secondary | ICD-10-CM

## 2014-02-19 DIAGNOSIS — Z0000 Encounter for general adult medical examination without abnormal findings: Secondary | ICD-10-CM

## 2014-02-19 DIAGNOSIS — M79672 Pain in left foot: Secondary | ICD-10-CM

## 2014-02-19 DIAGNOSIS — F17200 Nicotine dependence, unspecified, uncomplicated: Secondary | ICD-10-CM

## 2014-02-19 DIAGNOSIS — M79671 Pain in right foot: Secondary | ICD-10-CM

## 2014-02-19 DIAGNOSIS — M2010 Hallux valgus (acquired), unspecified foot: Secondary | ICD-10-CM

## 2014-02-19 DIAGNOSIS — E785 Hyperlipidemia, unspecified: Secondary | ICD-10-CM

## 2014-02-19 DIAGNOSIS — M2011 Hallux valgus (acquired), right foot: Secondary | ICD-10-CM

## 2014-02-19 LAB — LIPID PANEL
CHOL/HDL RATIO: 5.2 ratio
Cholesterol: 165 mg/dL (ref 0–200)
HDL: 32 mg/dL — ABNORMAL LOW (ref >39)
LDL CALC: 69 mg/dL (ref 0–99)
Triglycerides: 321 mg/dL — ABNORMAL HIGH (ref <150)
VLDL: 64 mg/dL — ABNORMAL HIGH (ref 0–40)

## 2014-02-19 LAB — POCT URINALYSIS DIPSTICK
Bilirubin, UA: NEGATIVE
Blood, UA: NEGATIVE
GLUCOSE UA: NEGATIVE
Ketones, UA: NEGATIVE
Leukocytes, UA: NEGATIVE
Nitrite, UA: NEGATIVE
PH UA: 5.5
PROTEIN UA: NEGATIVE
Spec Grav, UA: 1.01
Urobilinogen, UA: 0.2

## 2014-02-19 LAB — POCT UA - MICROSCOPIC ONLY
BACTERIA, U MICROSCOPIC: NEGATIVE
CASTS, UR, LPF, POC: NEGATIVE
CRYSTALS, UR, HPF, POC: NEGATIVE
Mucus, UA: NEGATIVE
Yeast, UA: NEGATIVE

## 2014-02-19 LAB — COMPREHENSIVE METABOLIC PANEL
ALBUMIN: 4 g/dL (ref 3.5–5.2)
ALK PHOS: 57 U/L (ref 39–117)
ALT: 32 U/L (ref 0–53)
AST: 29 U/L (ref 0–37)
BILIRUBIN TOTAL: 0.4 mg/dL (ref 0.2–1.2)
BUN: 12 mg/dL (ref 6–23)
CALCIUM: 8.9 mg/dL (ref 8.4–10.5)
CO2: 25 meq/L (ref 19–32)
CREATININE: 1.06 mg/dL (ref 0.50–1.35)
Chloride: 103 mEq/L (ref 96–112)
Glucose, Bld: 130 mg/dL — ABNORMAL HIGH (ref 70–99)
Potassium: 4.5 mEq/L (ref 3.5–5.3)
Sodium: 137 mEq/L (ref 135–145)
TOTAL PROTEIN: 6.6 g/dL (ref 6.0–8.3)

## 2014-02-19 LAB — TSH: TSH: 3.253 u[IU]/mL (ref 0.350–4.500)

## 2014-02-19 LAB — GLUCOSE, POCT (MANUAL RESULT ENTRY): POC GLUCOSE: 136 mg/dL — AB (ref 70–99)

## 2014-02-19 LAB — POCT GLYCOSYLATED HEMOGLOBIN (HGB A1C): Hemoglobin A1C: 6.5

## 2014-02-19 NOTE — Patient Instructions (Signed)
Smoking Cessation Quitting smoking is important to your health and has many advantages. However, it is not always easy to quit since nicotine is a very addictive drug. Often times, people try 3 times or more before being able to quit. This document explains the best ways for you to prepare to quit smoking. Quitting takes hard work and a lot of effort, but you can do it. ADVANTAGES OF QUITTING SMOKING  You will live longer, feel better, and live better.  Your body will feel the impact of quitting smoking almost immediately.  Within 20 minutes, blood pressure decreases. Your pulse returns to its normal level.  After 8 hours, carbon monoxide levels in the blood return to normal. Your oxygen level increases.  After 24 hours, the chance of having a heart attack starts to decrease. Your breath, hair, and body stop smelling like smoke.  After 48 hours, damaged nerve endings begin to recover. Your sense of taste and smell improve.  After 72 hours, the body is virtually free of nicotine. Your bronchial tubes relax and breathing becomes easier.  After 2 to 12 weeks, lungs can hold more air. Exercise becomes easier and circulation improves.  The risk of having a heart attack, stroke, cancer, or lung disease is greatly reduced.  After 1 year, the risk of coronary heart disease is cut in half.  After 5 years, the risk of stroke falls to the same as a nonsmoker.  After 10 years, the risk of lung cancer is cut in half and the risk of other cancers decreases significantly.  After 15 years, the risk of coronary heart disease drops, usually to the level of a nonsmoker.  If you are pregnant, quitting smoking will improve your chances of having a healthy baby.  The people you live with, especially any children, will be healthier.  You will have extra money to spend on things other than cigarettes. QUESTIONS TO THINK ABOUT BEFORE ATTEMPTING TO QUIT You may want to talk about your answers with your  caregiver.  Why do you want to quit?  If you tried to quit in the past, what helped and what did not?  What will be the most difficult situations for you after you quit? How will you plan to handle them?  Who can help you through the tough times? Your family? Friends? A caregiver?  What pleasures do you get from smoking? What ways can you still get pleasure if you quit? Here are some questions to ask your caregiver:  How can you help me to be successful at quitting?  What medicine do you think would be best for me and how should I take it?  What should I do if I need more help?  What is smoking withdrawal like? How can I get information on withdrawal? GET READY  Set a quit date.  Change your environment by getting rid of all cigarettes, ashtrays, matches, and lighters in your home, car, or work. Do not let people smoke in your home.  Review your past attempts to quit. Think about what worked and what did not. GET SUPPORT AND ENCOURAGEMENT You have a better chance of being successful if you have help. You can get support in many ways.  Tell your family, friends, and co-workers that you are going to quit and need their support. Ask them not to smoke around you.  Get individual, group, or telephone counseling and support. Programs are available at local hospitals and health centers. Call your local health department for   information about programs in your area.  Spiritual beliefs and practices may help some smokers quit.  Download a "quit meter" on your computer to keep track of quit statistics, such as how long you have gone without smoking, cigarettes not smoked, and money saved.  Get a self-help book about quitting smoking and staying off of tobacco. LEARN NEW SKILLS AND BEHAVIORS  Distract yourself from urges to smoke. Talk to someone, go for a walk, or occupy your time with a task.  Change your normal routine. Take a different route to work. Drink tea instead of coffee.  Eat breakfast in a different place.  Reduce your stress. Take a hot bath, exercise, or read a book.  Plan something enjoyable to do every day. Reward yourself for not smoking.  Explore interactive web-based programs that specialize in helping you quit. GET MEDICINE AND USE IT CORRECTLY Medicines can help you stop smoking and decrease the urge to smoke. Combining medicine with the above behavioral methods and support can greatly increase your chances of successfully quitting smoking.  Nicotine replacement therapy helps deliver nicotine to your body without the negative effects and risks of smoking. Nicotine replacement therapy includes nicotine gum, lozenges, inhalers, nasal sprays, and skin patches. Some may be available over-the-counter and others require a prescription.  Antidepressant medicine helps people abstain from smoking, but how this works is unknown. This medicine is available by prescription.  Nicotinic receptor partial agonist medicine simulates the effect of nicotine in your brain. This medicine is available by prescription. Ask your caregiver for advice about which medicines to use and how to use them based on your health history. Your caregiver will tell you what side effects to look out for if you choose to be on a medicine or therapy. Carefully read the information on the package. Do not use any other product containing nicotine while using a nicotine replacement product.  RELAPSE OR DIFFICULT SITUATIONS Most relapses occur within the first 3 months after quitting. Do not be discouraged if you start smoking again. Remember, most people try several times before finally quitting. You may have symptoms of withdrawal because your body is used to nicotine. You may crave cigarettes, be irritable, feel very hungry, cough often, get headaches, or have difficulty concentrating. The withdrawal symptoms are only temporary. They are strongest when you first quit, but they will go away within  10-14 days. To reduce the chances of relapse, try to:  Avoid drinking alcohol. Drinking lowers your chances of successfully quitting.  Reduce the amount of caffeine you consume. Once you quit smoking, the amount of caffeine in your body increases and can give you symptoms, such as a rapid heartbeat, sweating, and anxiety.  Avoid smokers because they can make you want to smoke.  Do not let weight gain distract you. Many smokers will gain weight when they quit, usually less than 10 pounds. Eat a healthy diet and stay active. You can always lose the weight gained after you quit.  Find ways to improve your mood other than smoking. FOR MORE INFORMATION  www.smokefree.gov  Document Released: 07/21/2001 Document Revised: 01/26/2012 Document Reviewed: 11/05/2011 [facility] Patient Information 2015 [place], [place]. This information is not intended to replace advice given to you by your health care provider. Make sure you discuss any questions you have with your health care provider. Calorie Counting for Weight Loss Calories are energy you get from the things you eat and drink. Your body uses this energy to keep you going throughout the day. The  number of calories you eat affects your weight. When you eat more calories than your body needs, your body stores the extra calories as fat. When you eat fewer calories than your body needs, your body burns fat to get the energy it needs. Calorie counting means keeping track of how many calories you eat and drink each day. If you make sure to eat fewer calories than your body needs, you should lose weight. In order for calorie counting to work, you will need to eat the number of calories that are right for you in a day to lose a healthy amount of weight per week. A healthy amount of weight to lose per week is usually 1-2 lb (0.5-0.9 kg). A dietitian can determine how many calories you need in a day and give you suggestions on how to reach your calorie goal.  WHAT IS  MY MY PLAN? My goal is to have __________ calories per day.  If I have this many calories per day, I should lose around __________ pounds per week. WHAT DO I NEED TO KNOW ABOUT CALORIE COUNTING? In order to meet your daily calorie goal, you will need to:  Find out how many calories are in each food you would like to eat. Try to do this before you eat.  Decide how much of the food you can eat.  Write down what you ate and how many calories it had. Doing this is called keeping a food log. WHERE DO I FIND CALORIE INFORMATION? The number of calories in a food can be found on a Nutrition Facts label. Note that all the information on a label is based on a specific serving of the food. If a food does not have a Nutrition Facts label, try to look up the calories online or ask your dietitian for help. HOW DO I DECIDE HOW MUCH TO EAT? To decide how much of the food you can eat, you will need to consider both the number of calories in one serving and the size of one serving. This information can be found on the Nutrition Facts label. If a food does not have a Nutrition Facts label, look up the information online or ask your dietitian for help. Remember that calories are listed per serving. If you choose to have more than one serving of a food, you will have to multiply the calories per serving by the amount of servings you plan to eat. For example, the label on a package of bread might say that a serving size is 1 slice and that there are 90 calories in a serving. If you eat 1 slice, you will have eaten 90 calories. If you eat 2 slices, you will have eaten 180 calories. HOW DO I KEEP A FOOD LOG? After each meal, record the following information in your food log:  What you ate.  How much of it you ate.  How many calories it had.  Then, add up your calories. Keep your food log near you, such as in a small notebook in your pocket. Another option is to use a mobile app or website. Some programs will  calculate calories for you and show you how many calories you have left each time you add an item to the log. WHAT ARE SOME CALORIE COUNTING TIPS?  Use your calories on foods and drinks that will fill you up and not leave you hungry. Some examples of this include foods like nuts and nut butters, vegetables, lean proteins, and high-fiber foods (  more than 5 g fiber per serving).  Eat nutritious foods and avoid empty calories. Empty calories are calories you get from foods or beverages that do not have many nutrients, such as candy and soda. It is better to have a nutritious high-calorie food (such as an avocado) than a food with few nutrients (such as a bag of chips).  Know how many calories are in the foods you eat most often. This way, you do not have to look up how many calories they have each time you eat them.  Look out for foods that may seem like low-calorie foods but are really high-calorie foods, such as baked goods, soda, and fat-free candy.  Pay attention to calories in drinks. Drinks such as sodas, specialty coffee drinks, alcohol, and juices have a lot of calories yet do not fill you up. Choose low-calorie drinks like water and diet drinks.  Focus your calorie counting efforts on higher calorie items. Logging the calories in a garden salad that contains only vegetables is less important than calculating the calories in a milk shake.  Find a way of tracking calories that works for you. Get creative. Most people who are successful find ways to keep track of how much they eat in a day, even if they do not count every calorie. WHAT ARE SOME PORTION CONTROL TIPS?  Know how many calories are in a serving. This will help you know how many servings of a certain food you can have.  Use a measuring cup to measure serving sizes. This is helpful when you start out. With time, you will be able to estimate serving sizes for some foods.  Take some time to put servings of different foods on your  favorite plates, bowls, and cups so you know what a serving looks like.  Try not to eat straight from a bag or box. Doing this can lead to overeating. Put the amount you would like to eat in a cup or on a plate to make sure you are eating the right portion.  Use smaller plates, glasses, and bowls to prevent overeating. This is a quick and easy way to practice portion control. If your plate is smaller, less food can fit on it.  Try not to multitask while eating, such as watching TV or using your computer. If it is time to eat, sit down at a table and enjoy your food. Doing this will help you to start recognizing when you are full. It will also make you more aware of what and how much you are eating. HOW CAN I CALORIE COUNT WHEN EATING OUT?  Ask for smaller portion sizes or child-sized portions.  Consider sharing an [place] and sides instead of getting your own [place].  If you get your own Eus[place]io Me, eat only half. Ask for a box at the beginning of your meal and put the rest of your entre in it so you are not tempted to eat it.  Look for the calories on the menu. If calories are listed, choose the lower calorie options.  Choose dishes that include vegetables, fruits, whole grains, low-fat dairy products, and lean protein. Focusing on smart food choices from each of the 5 food groups can help you stay on track at restaurants.  Choose items that are boiled, broiled, grilled, or steamed.  Choose water, milk, unsweetened iced tea, or other drinks without added sugars. If you want an alcoholic beverage, choose a lower calorie option. For example, a regular margarita can have up  to 700 calories and a glass of wine has around 150.  Stay away from items that are buttered, battered, fried, or served with cream sauce. Items labeled "crispy" are usually fried, unless stated otherwise.  Ask for dressings, sauces, and syrups on the side. These are usually very high in calories, so do not eat much of  them.  Watch out for salads. Many people think salads are a healthy option, but this is often not the case. Many salads come with bacon, fried chicken, lots of cheese, fried chips, and dressing. All of these items have a lot of calories. If you want a salad, choose a garden salad and ask for grilled meats or steak. Ask for the dressing on the side, or ask for olive oil and vinegar or lemon to use as dressing.  Estimate how many servings of a food you are given. For example, a serving of cooked rice is 1/2 cup or about the size of half a tennis ball or one cupcake wrapper. Knowing serving sizes will help you be aware of how much food you are eating at restaurants. The list below tells you how big or small some common portion sizes are based on everyday objects.  1 oz--4 stacked dice.  3 oz--1 deck of cards.  1 tsp--1 dice.  1 Tbsp--1/2 a Ping-Pong ball.  2 Tbsp--1 Ping-Pong ball.  1/2 cup--1 tennis ball or 1 cupcake wrapper.  1 cup--1 baseball. Document Released: 07/27/2005 Document Revised: 08/01/2013 Document Reviewed: 06/01/2013 [facility] Patient Information 2015 [place], [place]. This information is not intended to replace advice given to you by your health care provider. Make sure you discuss any questions you have with your health care provider. Bunion (Hallux Valgus) A bony bump (protrusion) on the inside of the foot, at the base of the first toe, is called a bunion (hallux valgus). A bunion causes the first toe to angle toward the other toes. SYMPTOMS   A bony bump on the inside of the foot, causing an outward turning of the first toe. It may also overlap the second toe.  Thickening of the skin (callus) over the bony bump.  Fluid buildup under the callus. Fluid may become red, tender, and swollen (inflamed) with constant irritation or pressure.  Foot pain and stiffness. CAUSES  Many causes exist, including:  Inherited from your family (genetics).  Injury (trauma) forcing the  first toe into a position in which it overlaps other toes.  Bunions are also associated with wearing shoes that have a narrow toe box (pointy shoes). RISK INCREASES WITH:  Family history of foot abnormalities, especially bunions.  Arthritis.  Narrow shoes, especially high heels. PREVENTION  Wear shoes with a wide toe box.  Avoid shoes with high heels.  Wear a small pad between the big toe and second toe.  Maintain proper conditioning:  Foot and ankle flexibility.  Muscle strength and endurance. PROGNOSIS  With proper treatment, bunions can typically be cured. Occasionally, surgery is required.  RELATED COMPLICATIONS   Infection of the bunion.  Arthritis of the first toe.  Risks of surgery, including infection, bleeding, injury to nerves (numb toe), recurrent bunion, overcorrection (toe points inward), arthritis of the big toe, big toe pointing upward, and bone not healing. TREATMENT  Treatment first consists of stopping the activities that aggravate the pain, taking pain medicines, and icing to reduce inflammation and pain. Wear shoes with a wide toe box. Shoes can be modified by a shoe repair person to relieve pressure on the bunion,  especially if you cannot find shoes with a wide enough toe box. You may also place a pad with the center cut out in your shoe, to reduce pressure on the bunion. Sometimes, an arch support (orthotic) may reduce pressure on the bunion and alleviate the symptoms. Stretching and strengthening exercises for the muscles of the foot may be useful. You may choose to wear a brace or pad at night to hold the big toe away from the second toe. If non-surgical treatments are not successful, surgery may be needed. Surgery involves removing the overgrown tissue and correcting the position of the first toe, by realigning the bones. Bunion surgery is typically performed on an outpatient basis, meaning you can go home the same day as surgery. The surgery may involve  cutting the mid portion of the bone of the first toe, or just cutting and repairing (reconstructing) the ligaments and soft tissues around the first toe.  MEDICATION   If pain medicine is needed, nonsteroidal anti-inflammatory medicines, such as aspirin and ibuprofen, or other minor pain relievers, such as acetaminophen, are often recommended.  Do not take pain medicine for 7 days before surgery.  Prescription pain relievers are usually only prescribed after surgery. Use only as directed and only as much as you need.  Ointments applied to the skin may be helpful. HEAT AND COLD  Cold treatment (icing) relieves pain and reduces inflammation. Cold treatment should be applied for 10 to 15 minutes every 2 to 3 hours for inflammation and pain and immediately after any activity that aggravates your symptoms. Use ice packs or an ice massage.  Heat treatment may be used prior to performing the stretching and strengthening activities prescribed by your caregiver, physical therapist, or athletic trainer. Use a heat pack or a warm soak. SEEK MEDICAL CARE IF:   Symptoms get worse or do not improve in 2 weeks, despite treatment.  After surgery, you develop fever, increasing pain, redness, swelling, drainage of fluids, bleeding, or increasing warmth around the surgical area.  New, unexplained symptoms develop. (Drugs used in treatment may produce side effects.) Document Released: 07/27/2005 Document Revised: 10/19/2011 Document Reviewed: 11/08/2008 [facility] Patient Information 2015 [place]. This information is not intended to replace advice given to you by your health care provider. Make sure you discuss any questions you have with your health care provider.

## 2014-02-19 NOTE — Progress Notes (Signed)
   Subjective:    Patient ID: Aaron Moon, male    DOB: Jun 28, 1946, 68 y.o.   MRN: [id number]  HPI    Review of Systems     Objective:   Physical Exam        Assessment & Plan:

## 2014-02-19 NOTE — Progress Notes (Signed)
Subjective:    Patient ID: Aaron Moon, male    DOB: [birth year], 68 y.o.   MRN: [id number]  HPI 68 year old male here for CPE. Pt complains of bilateral foot pain. He says he has a callous on both feet. States the pain has increased. He regularly sees a podiatrist. He told his podiatrist about his foot pain, but they told him they would not do the surgery. He has specialty shoes, but says they are not helping. Hyperlipidemia is stable. No recent gout attacks. Last tetanus in 2005. Will be updated on immunizations today. No complaints with work.  Father had prostate cancer. Mother had colon cancer and lou gehrig's disease.      Pt also complains of a sore throat. States he has had it for about a week. No allergies. No post nasal drainage.  Tolerates meds Here for yearly physical and screening. Goes to [facility] for his specialty care and medications. Review of Systems  Constitutional: Negative.   HENT: Negative.   Eyes: Negative.   Cardiovascular: Negative.   Gastrointestinal: Negative.   Endocrine: Negative.   Genitourinary: Positive for enuresis.  Musculoskeletal: Positive for arthralgias and gait problem.  Allergic/Immunologic: Negative.   Neurological: Negative.   Hematological: Negative.   Psychiatric/Behavioral: Negative.    Colonoscopy utd    Objective:   Physical Exam  Constitutional: He is oriented to person, place, and time. He appears well-nourished. No distress.  HENT:  Head: Normocephalic.  Right Ear: External ear normal.  Left Ear: External ear normal.  Nose: Nose normal.  Mouth/Throat: Oropharynx is clear and moist.  Eyes: Conjunctivae and EOM are normal. Pupils are equal, round, and reactive to light. No scleral icterus.  Neck: Normal range of motion. Neck supple. No tracheal deviation present. No thyromegaly present.  Cardiovascular: Normal rate, regular rhythm, normal heart sounds and intact distal pulses.   Pulmonary/Chest: Effort normal and breath sounds  normal.  Abdominal: Soft. Bowel sounds are normal. He exhibits no distension and no mass. There is no tenderness. There is no guarding.  Genitourinary: Rectum normal, prostate normal and penis normal.  Musculoskeletal: He exhibits tenderness.  Neurological: He is alert and oriented to person, place, and time. No cranial nerve deficit. He exhibits normal muscle tone. Coordination normal.  Skin: No rash noted.  Psychiatric: He has a normal mood and affect. His behavior is normal. Judgment and thought content normal.   EKG normal PFTs restrictive, oximetry is low Labs [facility] reading (PRIMARY) by  Dr.Guest smoker, obese, cxr decreased expansion, normal otherwise   Results for orders placed in visit on 02/19/14  POCT UA - MICROSCOPIC ONLY      Result Value Ref Range   WBC, Ur, HPF, POC 1-2     RBC, urine, microscopic 0-2     Bacteria, U Microscopic neg     Mucus, UA neg     Epithelial cells, urine per micros 2-7     Crystals, Ur, HPF, POC neg     Casts, Ur, LPF, POC neg     Yeast, UA neg    POCT URINALYSIS DIPSTICK      Result Value Ref Range   Color, UA yellow     Clarity, UA clear     Glucose, UA neg     Bilirubin, UA neg     Ketones, UA neg     Spec Grav, UA 1.010     Blood, UA neg     pH, UA 5.5     Protein, UA  neg     Urobilinogen, UA 0.2     Nitrite, UA neg     Leukocytes, UA Negative    POCT GLYCOSYLATED HEMOGLOBIN (HGB A1C)      Result Value Ref Range   Hemoglobin A1C 6.5    GLUCOSE, POCT (MANUAL RESULT ENTRY)      Result Value Ref Range   POC Glucose 136 (*) 70 - 99 mg/dl        Assessment & Plan:  Pneumovax/TD today /needs prevnar next visit RF meds 1 year Need new cpe at 89 See new podiatrist about severe hallux valgus Lose weight/Exercise water and bike

## 2014-02-20 LAB — PSA: PSA: 0.42 ng/mL (ref <=4.00)

## 2014-02-26 ENCOUNTER — Encounter: Payer: Self-pay | Admitting: Internal Medicine

## 2015-07-22 ENCOUNTER — Ambulatory Visit (INDEPENDENT_AMBULATORY_CARE_PROVIDER_SITE_OTHER): Payer: Federal, State, Local not specified - PPO | Admitting: Family Medicine

## 2015-07-22 VITALS — BP 130/80 | HR 82 | Temp 98.6°F | Resp 17 | Ht 65.5 in | Wt 283.0 lb

## 2015-07-22 DIAGNOSIS — K219 Gastro-esophageal reflux disease without esophagitis: Secondary | ICD-10-CM | POA: Diagnosis not present

## 2015-07-22 DIAGNOSIS — M109 Gout, unspecified: Secondary | ICD-10-CM

## 2015-07-22 DIAGNOSIS — E785 Hyperlipidemia, unspecified: Secondary | ICD-10-CM | POA: Diagnosis not present

## 2015-07-22 DIAGNOSIS — M2010 Hallux valgus (acquired), unspecified foot: Secondary | ICD-10-CM

## 2015-07-22 DIAGNOSIS — Z0000 Encounter for general adult medical examination without abnormal findings: Secondary | ICD-10-CM

## 2015-07-22 DIAGNOSIS — E119 Type 2 diabetes mellitus without complications: Secondary | ICD-10-CM

## 2015-07-22 DIAGNOSIS — M21619 Bunion of unspecified foot: Secondary | ICD-10-CM

## 2015-07-22 LAB — POCT URINALYSIS DIP (MANUAL ENTRY)
Bilirubin, UA: NEGATIVE
Blood, UA: NEGATIVE
Glucose, UA: NEGATIVE
Ketones, POC UA: NEGATIVE
Leukocytes, UA: NEGATIVE
Nitrite, UA: NEGATIVE
Protein Ur, POC: NEGATIVE
Spec Grav, UA: 1.02
Urobilinogen, UA: 0.2
pH, UA: 5.5

## 2015-07-22 LAB — POCT CBC
Granulocyte percent: 57.8 %G (ref 37–80)
HCT, POC: 42.5 % — AB (ref 43.5–53.7)
Hemoglobin: 14.3 g/dL (ref 14.1–18.1)
Lymph, poc: 3.5 — AB (ref 0.6–3.4)
MCH, POC: 29.2 pg (ref 27–31.2)
MCHC: 33.7 g/dL (ref 31.8–35.4)
MCV: 86.5 fL (ref 80–97)
MID (cbc): 0.6 (ref 0–0.9)
MPV: 8.5 fL (ref 0–99.8)
POC Granulocyte: 5.7 (ref 2–6.9)
POC LYMPH PERCENT: 35.9 %L (ref 10–50)
POC MID %: 6.3 %M (ref 0–12)
Platelet Count, POC: 204 10*3/uL (ref 142–424)
RBC: 4.91 M/uL (ref 4.69–6.13)
RDW, POC: 13.8 %
WBC: 9.8 10*3/uL (ref 4.6–10.2)

## 2015-07-22 LAB — COMPLETE METABOLIC PANEL WITH GFR
ALT: 43 U/L (ref 9–46)
AST: 50 U/L — ABNORMAL HIGH (ref 10–35)
Albumin: 3.8 g/dL (ref 3.6–5.1)
Alkaline Phosphatase: 51 U/L (ref 40–115)
BUN: 12 mg/dL (ref 7–25)
CO2: 23 mmol/L (ref 20–31)
Calcium: 9.1 mg/dL (ref 8.6–10.3)
Chloride: 104 mmol/L (ref 98–110)
Creat: 1.09 mg/dL (ref 0.70–1.25)
GFR, Est African American: 80 mL/min (ref >=60)
GFR, Est Non African American: 69 mL/min (ref >=60)
Glucose, Bld: 135 mg/dL — ABNORMAL HIGH (ref 65–99)
Potassium: 4.9 mmol/L (ref 3.5–5.3)
Sodium: 138 mmol/L (ref 135–146)
Total Bilirubin: 0.3 mg/dL (ref 0.2–1.2)
Total Protein: 6.6 g/dL (ref 6.1–8.1)

## 2015-07-22 LAB — TSH: TSH: 3.638 u[IU]/mL (ref 0.350–4.500)

## 2015-07-22 LAB — HEMOGLOBIN A1C: Hgb A1c MFr Bld: 7.2 % — AB (ref 4.0–6.0)

## 2015-07-22 LAB — LIPID PANEL
Cholesterol: 165 mg/dL (ref 125–200)
HDL: 33 mg/dL — ABNORMAL LOW (ref >=40)
LDL Cholesterol: 75 mg/dL (ref <130)
Total CHOL/HDL Ratio: 5 Ratio (ref <=5.0)
Triglycerides: 283 mg/dL — ABNORMAL HIGH (ref <150)
VLDL: 57 mg/dL — ABNORMAL HIGH (ref <30)

## 2015-07-22 LAB — POCT GLYCOSYLATED HEMOGLOBIN (HGB A1C): Hemoglobin A1C: 7.2

## 2015-07-22 NOTE — Patient Instructions (Signed)
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
## 2015-07-24 ENCOUNTER — Telehealth: Payer: Self-pay

## 2015-07-25 ENCOUNTER — Telehealth: Payer: Self-pay

## 2015-07-25 ENCOUNTER — Encounter: Payer: Self-pay | Admitting: Family Medicine

## 2015-07-25 NOTE — Telephone Encounter (Signed)
Spoke to pt and informed of results.    Notes Recorded by Blima LedgerSheketia D Richardson, CMA on 07/25/2015 at 10:42 AM Left message on machine at home and cell to call back Notes Recorded by Johnnette LitterErin M Cardwell, CMA on 07/24/2015 at 10:17 AM Unable to LM-VM full Notes Recorded by Elvina SidleKurt Lauenstein, MD on 07/23/2015 at 7:57 AM Please inform patient of stable results. The blood sugar continues to be mildly elevated. Cholesterol controlled

## 2015-07-31 ENCOUNTER — Ambulatory Visit: Payer: Federal, State, Local not specified - PPO | Admitting: Physician Assistant

## 2015-08-08 ENCOUNTER — Encounter: Payer: Self-pay | Admitting: Podiatry

## 2015-08-08 ENCOUNTER — Ambulatory Visit (INDEPENDENT_AMBULATORY_CARE_PROVIDER_SITE_OTHER): Payer: Federal, State, Local not specified - PPO | Admitting: Podiatry

## 2015-08-08 ENCOUNTER — Ambulatory Visit (INDEPENDENT_AMBULATORY_CARE_PROVIDER_SITE_OTHER): Payer: Federal, State, Local not specified - PPO

## 2015-08-08 VITALS — BP 119/75 | HR 79 | Resp 16 | Ht 67.0 in | Wt 285.0 lb

## 2015-08-08 DIAGNOSIS — M21619 Bunion of unspecified foot: Secondary | ICD-10-CM | POA: Diagnosis not present

## 2015-08-08 DIAGNOSIS — M2040 Other hammer toe(s) (acquired), unspecified foot: Secondary | ICD-10-CM

## 2015-08-08 DIAGNOSIS — L84 Corns and callosities: Secondary | ICD-10-CM | POA: Diagnosis not present

## 2015-08-08 DIAGNOSIS — M216X9 Other acquired deformities of unspecified foot: Secondary | ICD-10-CM

## 2015-08-08 NOTE — Progress Notes (Signed)
Subjective:     Patient ID: Aaron Moon, male   DOB: 1946-01-23, 69 y.o.   MRN: [id number]  HPI patient presents with severe structural deformities of both feet with pain and deformity around the first metatarsal elevation of the digits and severe keratotic lesions right over left foot with pain present   Review of Systems  All other systems reviewed and are negative.      Objective:   Physical Exam  Constitutional: He is oriented to person, place, and time.  Cardiovascular: Intact distal pulses.   Musculoskeletal: Normal range of motion.  Neurological: He is oriented to person, place, and time.  Skin: Skin is warm.  Nursing note and vitals reviewed.  neurovascular status intact muscle strength was found to be within normal limits with patient found to have severe forefoot derangement bilateral with severe bunions lateral rotation of the digits and keratotic lesion subsecond metatarsal right over left it's very painful when pressed. Patient does have good digital perfusion was found to be well oriented 3     Assessment:     Severe forefoot derangement bilateral    Plan:     X-rays reviewed with patient and discussed the difficulty of any type of correction. At this time I debrided lesions advised on continued shoe gear that he is wearing with considerations for surgery and I will consult on this case with Dr. Ardelle AntonWagoner

## 2015-08-08 NOTE — Progress Notes (Signed)
   Subjective:    Patient ID: Aaron Moon, male    DOB: 1946/05/10, 69 y.o.   MRN: [id number]  HPI Patient presents with bilateral bunions. Pt stated, "[facility] Andrey CampanileWilson".  Patient also presents with bilateral callouses; plantar forefoot; x4-5 years.   Review of Systems  HENT: Positive for sore throat and tinnitus.   Cardiovascular: Positive for leg swelling.  Endocrine: Positive for polyuria.  Musculoskeletal: Positive for gait problem.  All other systems reviewed and are negative.      Objective:   Physical Exam        Assessment & Plan:

## 2016-03-04 ENCOUNTER — Telehealth: Payer: Self-pay | Admitting: *Deleted

## 2016-03-04 NOTE — Telephone Encounter (Addendum)
Pt state 06/2015, Dr. Charlsie Merles was going to consult with another doctor for his treatment, and he hasn't heard anything or does he need an appt. 03/05/2016-Pt states he was calling again for information about Dr. Beverlee Nims consult with another doctor.  I told pt that if Dr. Charlsie Merles doesn't respond to my messages, it meant pt needed to make an appt. I told pt I would have the schedulers call him tomorrow. 03/09/2016-Pt was seen in office today, left and returned requesting copy of x-ray.

## 2016-03-09 ENCOUNTER — Ambulatory Visit (INDEPENDENT_AMBULATORY_CARE_PROVIDER_SITE_OTHER): Payer: Federal, State, Local not specified - PPO

## 2016-03-09 ENCOUNTER — Encounter: Payer: Self-pay | Admitting: Podiatry

## 2016-03-09 ENCOUNTER — Ambulatory Visit (INDEPENDENT_AMBULATORY_CARE_PROVIDER_SITE_OTHER): Payer: Federal, State, Local not specified - PPO | Admitting: Podiatry

## 2016-03-09 VITALS — BP 151/79 | HR 85 | Resp 16

## 2016-03-09 DIAGNOSIS — M2040 Other hammer toe(s) (acquired), unspecified foot: Secondary | ICD-10-CM

## 2016-03-09 DIAGNOSIS — M216X9 Other acquired deformities of unspecified foot: Secondary | ICD-10-CM

## 2016-03-09 DIAGNOSIS — M2010 Hallux valgus (acquired), unspecified foot: Secondary | ICD-10-CM

## 2016-03-09 NOTE — Progress Notes (Signed)
Subjective:     Patient ID: Aaron Moon, male   DOB: March 26, 1946, 70 y.o.   MRN: [id number]  HPI patient presents with myself and Dr. Logan Bores to evaluate and treat. 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 Logan Bores and patient. It's been recommended that aggressive Austin-type osteotomy along with possible Akin osteotomy and metatarsal head resection 234 be accomplished in order to relieve discomfort. I discussed this case with patient and I'm referring him to Dr. Logan Bores who will do surgery on him in mid September after reviewing with him the causes of his problems. Patient had debridement accomplished today which gave temporary relief  X-ray report indicates severe malalignment of the forefoot bilateral with severe elevation of the intermetatarsal angle and hallux that is underneath the second toe left over right with displacement at the metatarsal phalangeal joints left over right

## 2016-03-31 ENCOUNTER — Telehealth: Payer: Self-pay | Admitting: *Deleted

## 2016-03-31 NOTE — Telephone Encounter (Signed)
I left patient a message to call me.  I need to see if she wants to schedule surgery with Dr. Logan BoresEvans in September.

## 2016-03-31 NOTE — Telephone Encounter (Signed)
"  I'm returning your call.  I'd like to go ahead and schedule my surgery.  I want to go ahead and get it over with."  Dr. Logan BoresEvans can do it September 7 or 14th.  "Put me down for the 7th."  You're going to need to register with the surgical center.  "How do I do that?"  Did you receive a blue bag with surgery brochure in it?  "I haven't gotten anything."  You are going to need an appointment to come in for a consultation with Dr. Logan BoresEvans prior to surgery.  Let me transfer you to a scheduler.

## 2016-04-06 ENCOUNTER — Telehealth: Payer: Self-pay | Admitting: *Deleted

## 2016-04-06 NOTE — Telephone Encounter (Signed)
"  I need a call today.  I need to know what your procedure number is for insurance purposes.  [employer] reported that you all are not in the network.  Call me back as soon as possible."

## 2016-04-07 NOTE — Telephone Encounter (Signed)
"  I'm scheduled for surgery next week with Dr. Logan BoresEvans.  I need to cancel it.  I spoke to the [facility] and they said they would take care of it there.  So, I'm going to have it done there."  I'll let Dr. Logan BoresEvans know.    I called the surgical center and canceled surgery for  04/16/2016.

## 2016-04-08 ENCOUNTER — Ambulatory Visit: Payer: Federal, State, Local not specified - PPO | Admitting: Podiatry

## 2016-05-20 ENCOUNTER — Ambulatory Visit (INDEPENDENT_AMBULATORY_CARE_PROVIDER_SITE_OTHER): Payer: Federal, State, Local not specified - PPO | Admitting: Podiatry

## 2016-05-20 DIAGNOSIS — M79671 Pain in right foot: Secondary | ICD-10-CM | POA: Diagnosis not present

## 2016-05-20 DIAGNOSIS — M79672 Pain in left foot: Secondary | ICD-10-CM

## 2016-05-20 DIAGNOSIS — M216X1 Other acquired deformities of right foot: Secondary | ICD-10-CM

## 2016-05-20 DIAGNOSIS — M2042 Other hammer toe(s) (acquired), left foot: Secondary | ICD-10-CM

## 2016-05-20 DIAGNOSIS — M2041 Other hammer toe(s) (acquired), right foot: Secondary | ICD-10-CM | POA: Diagnosis not present

## 2016-05-20 DIAGNOSIS — M2010 Hallux valgus (acquired), unspecified foot: Secondary | ICD-10-CM

## 2016-05-20 DIAGNOSIS — M216X2 Other acquired deformities of left foot: Secondary | ICD-10-CM | POA: Diagnosis not present

## 2016-05-21 NOTE — Progress Notes (Signed)
Subjective:  70 year old male presents to the office today for evaluation of bilateral foot pain. Patient was originally scheduled for surgery however he was notified that he had that surgery performed at the [facility]. Patient was uncomfortable having this surgery performed there any presents today for a new surgical consult.  Patient states she's had continued pain and tenderness to the bilateral feet is unable to ambulate without pain.    Objective/Physical Exam General: The patient is alert and oriented x3 in no acute distress.  Dermatology: Skin is warm, dry and supple bilateral lower extremities. Negative for open lesions or macerations.  Vascular: Palpable pedal pulses bilaterally. No edema or erythema noted. Capillary refill within normal limits.  Neurological: Epicritic and protective threshold grossly intact bilaterally.   Musculoskeletal Exam: Hallux abductovalgus deformity bilateral with increased metatarsal angle and disarticulation of the first MPJ noted on x-ray. Hammertoe contracture digits 2 through 5 also noted bilaterally with disarticulation of the MPJs 2, 3 and 4 bilaterally as evident on radiographic exam. Limited range of motion with ankle joint dorsiflexion noted bilateral. Limited range of motion to all MPJs of the bilateral forefeet. Clinical evidence of pes planus deformity.  Radiographic Exam:  Disarticulation of MPJs 1 through 4 bilateral feet noted. Diffuse arthritic changes noted throughout the forefoot. Increased intermetatarsal angle greater than 15 noted with hallux abduction angle greater than 45. PIPJ contracture noted through digits 2,3 and 4 bilateral feet.  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 #2 hammertoe contracture digits 2,3,4 and 5 with disarticulation of MPJs 2,3 and 4 #3 gastrocnemius equinus bilateral lower extremities #4 pain in bilateral feet   Plan of Care:  #1 Patient was evaluated. #2  today discussed in detail with the patient the conservative versus surgical management of his bilateral structural deformities. Patient opted for surgical correction which would include metatarsal head resections 2 through 4, first metatarsal cuneiform arthrodesis, first MPJ arthroplasty with implant, PIPJ arthroplasty with possible arthrodesis 2 through 5, and MPJ capsulotomy fifth MPJ. All procedures performed on the left foot. Left foot is more symptomatic than the right so we will do 1 foot at a time. #3 authorization for surgery and paperwork completed today and patient is in touch with surgery scheduler to coordinate a date for surgery. #4 discussed in detail all possible complications of surgery. All conservative methods of treatment have failed. #5 today the surgical boot was dispensed  #6 patient is to return to clinic 1 week postop   Dr. Felecia ShellingBrent M. Analysia Dungee, DPM [facility]

## 2016-05-25 ENCOUNTER — Telehealth: Payer: Self-pay | Admitting: *Deleted

## 2016-05-25 NOTE — Telephone Encounter (Signed)
"  My sister is worried about me.  She is stating I am going to need someone to take care of me after the surgery.  She wants to know if I need to get a care taker to come and stay with me after the surgery.  Is it necessary to have someone stay with me after the surgery?"  The doctors prefer you have someone to take care of you day of surgery.  "Will I be able to walk or stand?"  No, you will not be able to walk or stand on your foot.  Do you have a knee scooter?  "Yes, I have a scooter."  You should be okay getting around with the scooter.  "What about food will I be able to eat after surgery?"  Yes, you will be able to eat after surgery.  Start out with something light then gradually eat whatever you would like.  "You may want to call my sister and let her know."  I cannot call you sister.  You did not sign a HIPPA form allowing me to do so.  "You have to have that in writing?"  Yes, we have to have it in writing.  "Why can I not get a caregiver?"  I am not sure that your insurance will cover a caregiver.  You can call a home health care facility and inquire.  "I think my sister called and said they will charge a fee for 2 hours.  I can't remember the price.  I don't want to spend any money on that if I will be able to get around."  You should be okay with use of the knee walker.  "Okay, I will let her know."

## 2016-05-25 NOTE — Telephone Encounter (Signed)
"  I'm the sister of Annabelle HarmanDana.  He is [facility]  Dr. Logan BoresEvans is his doctor.  I am concerned about him having care after he has the surgery.  Please get in touch with me.  Call me back as soon as possible.  Thank you."

## 2016-05-26 ENCOUNTER — Encounter: Payer: Self-pay | Admitting: Podiatry

## 2016-05-28 ENCOUNTER — Encounter: Payer: Self-pay | Admitting: Podiatry

## 2016-05-28 DIAGNOSIS — M2012 Hallux valgus (acquired), left foot: Secondary | ICD-10-CM | POA: Diagnosis not present

## 2016-05-28 DIAGNOSIS — M2042 Other hammer toe(s) (acquired), left foot: Secondary | ICD-10-CM | POA: Diagnosis not present

## 2016-05-28 DIAGNOSIS — M216X2 Other acquired deformities of left foot: Secondary | ICD-10-CM | POA: Diagnosis not present

## 2016-05-28 DIAGNOSIS — M21542 Acquired clubfoot, left foot: Secondary | ICD-10-CM | POA: Diagnosis not present

## 2016-05-29 ENCOUNTER — Telehealth: Payer: Self-pay | Admitting: *Deleted

## 2016-05-29 NOTE — Telephone Encounter (Signed)
Pt called to see how long he needed to elevate his foot. I told him to elevate the foot at all times he is not to dangle the foot, or take off the boot, and he should not be weight bearing more than 15 mins/hour. Pt states he hasn't had a bowel movement in several days. I told pt to begin a stool softner now.

## 2016-06-01 NOTE — Progress Notes (Signed)
DOS 10.19.2017 Met Head Resection 2,3,4 Left; 1st Met. Cuneiform Arthrodesis; Poss 1st MPJ Arthrodesis, PIPJ Arthroplasty 2,3,4,5; MPJ Capsulotomy 5th Left Foot; Possible MPJ Arthroplasty; Tendo-Achilles Lengthening

## 2016-06-04 ENCOUNTER — Ambulatory Visit (INDEPENDENT_AMBULATORY_CARE_PROVIDER_SITE_OTHER): Payer: Federal, State, Local not specified - PPO

## 2016-06-04 ENCOUNTER — Ambulatory Visit (INDEPENDENT_AMBULATORY_CARE_PROVIDER_SITE_OTHER): Payer: Federal, State, Local not specified - PPO | Admitting: Podiatry

## 2016-06-04 DIAGNOSIS — Z9889 Other specified postprocedural states: Secondary | ICD-10-CM

## 2016-06-04 DIAGNOSIS — M216X1 Other acquired deformities of right foot: Secondary | ICD-10-CM

## 2016-06-04 DIAGNOSIS — M216X2 Other acquired deformities of left foot: Secondary | ICD-10-CM | POA: Diagnosis not present

## 2016-06-09 ENCOUNTER — Telehealth: Payer: Self-pay | Admitting: *Deleted

## 2016-06-09 MED ORDER — OXYCODONE-ACETAMINOPHEN 5-325 MG PO TABS: 1.0000 | ORAL_TABLET | ORAL | 0 refills | Status: DC | PRN

## 2016-06-11 ENCOUNTER — Encounter: Payer: Federal, State, Local not specified - PPO | Admitting: Podiatry

## 2016-06-11 ENCOUNTER — Ambulatory Visit (INDEPENDENT_AMBULATORY_CARE_PROVIDER_SITE_OTHER): Payer: Federal, State, Local not specified - PPO | Admitting: Podiatry

## 2016-06-11 DIAGNOSIS — Z9889 Other specified postprocedural states: Secondary | ICD-10-CM

## 2016-06-11 DIAGNOSIS — T814XXA Infection following a procedure, initial encounter: Secondary | ICD-10-CM

## 2016-06-11 DIAGNOSIS — T8140XA Infection following a procedure, unspecified, initial encounter: Secondary | ICD-10-CM

## 2016-06-11 MED ORDER — GENTAMICIN SULFATE 0.1 % EX CREA: 1.0000 "application " | TOPICAL_CREAM | Freq: Every day | CUTANEOUS | 1 refills | Status: DC

## 2016-06-11 MED ORDER — SULFAMETHOXAZOLE-TRIMETHOPRIM 800-160 MG PO TABS: 1.0000 | ORAL_TABLET | Freq: Two times a day (BID) | ORAL | 0 refills | Status: DC

## 2016-06-11 MED ORDER — CIPROFLOXACIN HCL 500 MG PO TABS: 500.0000 mg | ORAL_TABLET | Freq: Two times a day (BID) | ORAL | 0 refills | Status: DC

## 2016-06-12 NOTE — Telephone Encounter (Addendum)
-----   Message from Felecia ShellingBrent M Evans, DPM sent at 06/11/2016  3:44 PM EDT ----- Regarding: Home Health Daily Dressing Changes Please arrange home health dressing changes to left foot post op surgical sites.   Daily x 6 weeks. Patient is home bound.   - Cleanse foot with wound cleanser or normal saline. Dry.  - Apply gentamycin cream to all incision sites and percutaneous pins.  - Dress with 4x4 gauze, large kerlex, and light ace wrap. Reapply boot. 06/12/2016-Faxed required form, pt's clinicals, demographics and [facility] 06/11/2016 [facility]

## 2016-06-13 NOTE — Progress Notes (Signed)
Subjective: Patient presents today for his second postoperative visit for reconstructive surgery of the left foot. Date of surgery 05/28/2016. Patient states that he is feeling well. Patient states he has minimal pain.  Objective: Incision sites are coapted with and staples intact. There is an increased amount of cellulitis with erythema noted to the left forefoot. This is increased from the last visit. Serous drainage noted with yellow crusting.  Assessment: Status post 2 weeks reconstructive surgery left foot. Cellulitis with possible postoperative infection left foot.  Plan of care: Today dressings were changed. Patient was evaluated. Prescription for Bactrim and clindamycin placed. Prescription for gentamicin cream placed. Orders for home health dressing changes placed. Patient is to return to clinic in 1 week.

## 2016-06-14 NOTE — Progress Notes (Signed)
Subjective: Patient presents today status post left forefoot surgical reconstruction. Date of surgery 05/28/2016. Patient states the pain is minimal. Patient presents today with his cam boot, however the cam boot was wet upon presentation. Patient states that may been due to the carpet cleaners came to his house.  Objective: Incision sites are well coapted with staples intact. Moderate amount of edema noted. X-rays indicate intact orthopedic hardware.  Assessment: Status post 1 week reconstructive surgery of the left forefoot. Date of surgery 05/28/2016.  Plan of care: Patient was evaluated today. Dressings changed. Continue minimal weightbearing in a cam boot. Patient is to return to clinic in 1 week.

## 2016-06-15 NOTE — Telephone Encounter (Signed)
-----   Message from Felecia ShellingBrent M Evans, DPM sent at 06/13/2016  2:37 PM EDT ----- Regarding: FW: Home Health Daily Dressing Changes - progress note last patient encounter completed Progress note last patient encounter completed. Please arrange Home Health Dressing orders (see below) Thanks, Dr. Logan BoresEvans   ----- Message ----- From: Felecia ShellingBrent M Evans, DPM Sent: 06/11/2016   3:44 PM To: Marissa NestleValery D O'Connell, RN, Thalia PartyKari Danforth Subject: Home Health Daily Dressing Changes             Please arrange home health dressing changes to left foot post op surgical sites.   Daily x 6 weeks. Patient is home bound.   - Cleanse foot with wound cleanser or normal saline. Dry.  - Apply gentamycin cream to all incision sites and percutaneous pins.  - Dress with 4x4 gauze, large kerlex, and light ace wrap. Reapply boot.

## 2016-06-18 ENCOUNTER — Encounter: Payer: Self-pay | Admitting: Podiatry

## 2016-06-18 ENCOUNTER — Other Ambulatory Visit: Payer: Self-pay | Admitting: Podiatry

## 2016-06-18 ENCOUNTER — Ambulatory Visit (INDEPENDENT_AMBULATORY_CARE_PROVIDER_SITE_OTHER): Payer: Federal, State, Local not specified - PPO

## 2016-06-18 ENCOUNTER — Ambulatory Visit (INDEPENDENT_AMBULATORY_CARE_PROVIDER_SITE_OTHER): Payer: Federal, State, Local not specified - PPO | Admitting: Podiatry

## 2016-06-18 VITALS — BP 113/69 | HR 86 | Temp 99.1°F

## 2016-06-18 DIAGNOSIS — M216X1 Other acquired deformities of right foot: Secondary | ICD-10-CM | POA: Diagnosis not present

## 2016-06-18 DIAGNOSIS — Z9889 Other specified postprocedural states: Secondary | ICD-10-CM

## 2016-06-18 DIAGNOSIS — M2041 Other hammer toe(s) (acquired), right foot: Secondary | ICD-10-CM

## 2016-06-18 DIAGNOSIS — M2042 Other hammer toe(s) (acquired), left foot: Secondary | ICD-10-CM

## 2016-06-18 DIAGNOSIS — M216X2 Other acquired deformities of left foot: Secondary | ICD-10-CM

## 2016-06-18 DIAGNOSIS — M2010 Hallux valgus (acquired), unspecified foot: Secondary | ICD-10-CM

## 2016-06-21 NOTE — Progress Notes (Signed)
Subjective: Patient presents today for his third postoperative visit for reconstructive surgery of the left foot. Date of surgery 05/28/2016. Patient states that he is feeling well. Patient states he has minimal pain.  Objective: Incision sites are coapted with and staples intact. Erythema noted on the last visit has decreased significantly. Skin edges appear well coapted with staples intact. No drainage noted.   Assessment: Status post 3 weeks reconstructive surgery left foot. Healing well.  Plan of care: Today skin staples were removed. Patient was evaluated. Finish prescriptions for Bactrim and clindamycin. Continue home health dressing changes as directed. Patient is to return to clinic in 2 weeks.

## 2016-06-24 ENCOUNTER — Encounter: Payer: Self-pay | Admitting: Podiatry

## 2016-06-24 NOTE — Progress Notes (Signed)
Put patients records to be mailed to him on Thursday 25 June 2016 per patient's request. [facility]

## 2016-07-01 ENCOUNTER — Encounter: Payer: Self-pay | Admitting: Podiatry

## 2016-07-01 ENCOUNTER — Ambulatory Visit (INDEPENDENT_AMBULATORY_CARE_PROVIDER_SITE_OTHER): Payer: Federal, State, Local not specified - PPO | Admitting: Podiatry

## 2016-07-01 DIAGNOSIS — Z9889 Other specified postprocedural states: Secondary | ICD-10-CM

## 2016-07-10 ENCOUNTER — Telehealth: Payer: Self-pay | Admitting: *Deleted

## 2016-07-10 NOTE — Telephone Encounter (Addendum)
Pt states he has an appt 07/13/2016, but his dressing looks dirty. I told pt as long as it was not wet he would be fine. Pt states he just wanted to make sure. 07/14/2016-Pt states he would like a refill of his pain medication. I told pt he could pick [place] the rx in the [place] office before 5:00p, pt states he will be here in an hour. 07/15/2016-Pt asked about washing his foot, he was told not to soak, but how long could the foot be wet. I spoke with pt he states the doctor told him he could take the dressing off and shower Thursday, but how much water could he get on the foot. I told pt we call it shower wet which means take his normal shower, but no soaking or being in standing water, get out of shower and pat area dry and if no instructed to redress cover dried area with a clean white sock. Pt states understanding and asked why. I told him because if he scrubbed he could possible pull open the fresh new skin of the surgery site and pt states understanding. 07/16/2016-Pt states his foot was reddish and numb after he took his dressing off yesterday. I asked pt if the foot was reddish and numb prior to his shower, and he said yes. I asked if the numbness was sudden or if he had it all along, and he said he had it all along. Pt denies his foot being hot or with drainage. I told pt that he may have done to much yesterday after getting the go ahead to shower often pts will start to feel as if they can do a lot more rather than just a little bit more and the surgery foot is still learning to acclimate to surgery swelling and day-to-day swelling which will often make the foot look more red. Pt states he did go to the [employer] yesterday and walk a lot. I told him that was probably it and to rest more and elevate the foot. I asked pt if the surgery foot's swelling return to the size it was in the morning after elevating and he stated yes. I told pt if he had increased redness or streaks up foot, swelling  that didn't go down to almost regular size, drainage or fever to call or call with concerns. Pt states understanding that he really wanted to know if he could use ice to the area. I told him ice at this time was great for swelling, and comfort. Pt states understanding. 07/17/2016-Pt states he is concerned about the color of the foot not looking like the other one. He states he soaked it for 2-3 minutes in a bucket of ice water a couple of times yesterday and it did look like the swelling had gone down, the color is still not right and he is concerned. I spoke with pt he states he is still concerned about the swelling and the reddish color, but the snow has him concerned too. I told him if he had called twice concerned about the foot, I felt he would benefit from being seen and not have to worry about it over the weekend.07/30/2016-Wanda Frances Furbish- [facility] asked that the outstanding orders be signed by Dr. Logan BoresEvans. 08/06/2017-[facility]

## 2016-07-13 ENCOUNTER — Ambulatory Visit (INDEPENDENT_AMBULATORY_CARE_PROVIDER_SITE_OTHER): Payer: Federal, State, Local not specified - PPO

## 2016-07-13 ENCOUNTER — Ambulatory Visit (INDEPENDENT_AMBULATORY_CARE_PROVIDER_SITE_OTHER): Payer: Federal, State, Local not specified - PPO | Admitting: Podiatry

## 2016-07-13 DIAGNOSIS — Z9889 Other specified postprocedural states: Secondary | ICD-10-CM

## 2016-07-13 DIAGNOSIS — M79671 Pain in right foot: Secondary | ICD-10-CM

## 2016-07-13 DIAGNOSIS — M21611 Bunion of right foot: Secondary | ICD-10-CM

## 2016-07-13 DIAGNOSIS — M2042 Other hammer toe(s) (acquired), left foot: Secondary | ICD-10-CM | POA: Diagnosis not present

## 2016-07-13 DIAGNOSIS — M2011 Hallux valgus (acquired), right foot: Secondary | ICD-10-CM

## 2016-07-13 DIAGNOSIS — M2041 Other hammer toe(s) (acquired), right foot: Secondary | ICD-10-CM

## 2016-07-13 NOTE — Patient Instructions (Signed)

## 2016-07-14 ENCOUNTER — Telehealth: Payer: Self-pay | Admitting: *Deleted

## 2016-07-14 MED ORDER — OXYCODONE-ACETAMINOPHEN 5-325 MG PO TABS: 1.0000 | ORAL_TABLET | ORAL | 0 refills | Status: DC | PRN

## 2016-07-14 NOTE — Telephone Encounter (Signed)
"  I'm supposed to be scheduled for surgery.  Anything I need to be doing?"  You are scheduled for 07/30/2016.  "What time am I supposed to be there?"  Someone from the surgical center will give you a call a day or to before with the arrival time.  "I don't know where I'm supposed to go."  You should have received a brochure from the surgical center in your blue bag.  "I haven't even looked through that stuff.  I will look at it."

## 2016-07-17 ENCOUNTER — Encounter: Payer: Self-pay | Admitting: Podiatry

## 2016-07-17 ENCOUNTER — Ambulatory Visit (INDEPENDENT_AMBULATORY_CARE_PROVIDER_SITE_OTHER): Payer: Federal, State, Local not specified - PPO | Admitting: Podiatry

## 2016-07-17 DIAGNOSIS — M2041 Other hammer toe(s) (acquired), right foot: Secondary | ICD-10-CM | POA: Diagnosis not present

## 2016-07-17 DIAGNOSIS — R609 Edema, unspecified: Secondary | ICD-10-CM

## 2016-07-17 DIAGNOSIS — M21611 Bunion of right foot: Secondary | ICD-10-CM

## 2016-07-17 DIAGNOSIS — M2011 Hallux valgus (acquired), right foot: Secondary | ICD-10-CM | POA: Diagnosis not present

## 2016-07-17 DIAGNOSIS — Z9889 Other specified postprocedural states: Secondary | ICD-10-CM

## 2016-07-17 NOTE — Progress Notes (Signed)
Subjective: Patient presents today status post reconstructive surgery of the left foot. Date of surgery 05/28/2016. Patient states that he is feeling very well. Patient states he has minimal pain. Patient also presents today for surgical consult regarding right foot reconstructive surgery. Patient states that he is ready to have his right foot surgery performed. Patient presents today for further treatment and evaluation  Objective: Left foot surgical incision sites are completely coapted. No dehiscence noted. No sign of erythema or cellulitic infectious process. The percutaneous pins are intact.  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 Hallux abductovalgus deformity greater than 30 with an intermetatarsal angle on x-ray greater than 15. 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  Radiographic exam: Left foot x-rays taken today. Orthopedic hardware intact. Healing is expected.  Assessment: #1 status post left foot reconstructive surgery. Doing well. Date of surgery 05/28/2016. 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 Light dressing applied. #2 today we discussed in detail the conservative versus surgical management of a foot reconstructive surgery. The patient opts for surgical correction. All patient questions were answered. No guarantees were expressed or implied. Surgical intervention will consist of  - Lapidus bunionectomy right foot. - Cheilectomy first metatarsal phalangeal joint right foot - Metatarsal head resections 2-for right foot - PIPJ arthroplasty digits 2-5 right foot  #3 authorization for surgery  initiated today #4 patient can begin showering in 3 days. patient can begin ambulating in a postoperative shoe to the left foot.  #5 return to clinic 1 week postop

## 2016-07-19 NOTE — Progress Notes (Signed)
Subjective: Patient presents today postoperative visit for reconstructive surgery of the left foot. Date of surgery 05/28/2016. Patient states these feeling well. Patient states that he has minimal pain.  Objective: Incision sites are well coapted. No dehiscence noted. No erythema or sign of infectious process.  Assessment: Status post reconstructive surgery left foot with routine healing.  Plan of care: Today patient was evaluated. Patient can begin walking in a postoperative shoe. Post operative shoe dispensed today. Prescription for a walker placed. Dressings were changed. Return to clinic in 2 weeks for x-rays and percutaneous fixation pin removal.

## 2016-07-20 DIAGNOSIS — M2041 Other hammer toe(s) (acquired), right foot: Secondary | ICD-10-CM | POA: Insufficient documentation

## 2016-07-20 DIAGNOSIS — M21611 Bunion of right foot: Principal | ICD-10-CM

## 2016-07-20 DIAGNOSIS — M2011 Hallux valgus (acquired), right foot: Secondary | ICD-10-CM | POA: Insufficient documentation

## 2016-07-20 NOTE — Progress Notes (Signed)
Subjective: Aaron Moon is a 70 y.o. is seen today in office s/p left foot reconstruction preformed on 05/28/16. He presents today for an unscheduled appointment due to increased swelling and pain to his left foot. He states that he may been on his feet quite a bit over the week which causes gets swollen. He did try to wear regular shoe but the foot is been swollen. Denies any redness or warmth the foot. Denies any recent injury or trauma. The pain is controlled and history is concerned that the swelling. Denies any systemic complaints such as fevers, chills, nausea, vomiting. No calf pain, chest pain, shortness of breath.   Objective: General: No acute distress, AAOx3  DP/PT pulses palpable 2/4, CRT < 3 sec to all digits.  Protective sensation intact. Motor function intact.  Left foot: Incision is well coapted without any evidence of dehiscence and a scar has formed over the incisions. There is no surrounding erythema, ascending cellulitis, fluctuance, crepitus, malodor, drainage/purulence. There is moderate edema to the foot. Mild tenderness along the surgical sites. No clinical signs of infection.  No other areas of tenderness to bilateral lower extremities.  No other open lesions or pre-ulcerative lesions.  No pain with calf compression, swelling, warmth, erythema.   Assessment and Plan:  Status post left foot surgery, presents today for increased swelling  -Treatment options discussed including all alternatives, risks, and complications -There was a single staple remaining in one of the toes which was removed. I reviewed previous x-rays from earlier this week.  -Unna boot was applied. Instructed he can leave on for up to 5 days but remove sooner if there is any increased swelling or discoloration of the toes or any pain associated with this. -Continue ice and elevation I discussed to limit activity and gradually increase activity level. -Pain medication as needed. He asked me for a refill  today but this was just filled earlier this week. -He is concerned about his upcoming surgery on the right foot with the swelling to the left. I will have him back to see Dr. Logan Moon prior to the right foot surgery.  Aaron Moon, DPM

## 2016-07-24 ENCOUNTER — Telehealth: Payer: Self-pay | Admitting: Podiatry

## 2016-07-24 MED ORDER — OXYCODONE-ACETAMINOPHEN 5-325 MG PO TABS: 1.0000 | ORAL_TABLET | ORAL | 0 refills | Status: DC | PRN

## 2016-07-24 NOTE — Telephone Encounter (Signed)
Patient called requesting a refill on his Oxycodone. Patient has an appointment Monday 18 December at 3:15 pm with Dr. Logan BoresEvans.

## 2016-07-24 NOTE — Telephone Encounter (Signed)
Informed pt that Dr. Philomena DohenyEvans okayed the refill of the Oxycodone. Pt [place] before 5:00pm.

## 2016-07-27 ENCOUNTER — Ambulatory Visit (INDEPENDENT_AMBULATORY_CARE_PROVIDER_SITE_OTHER): Payer: Federal, State, Local not specified - PPO | Admitting: Podiatry

## 2016-07-27 DIAGNOSIS — R600 Localized edema: Secondary | ICD-10-CM

## 2016-07-27 DIAGNOSIS — R609 Edema, unspecified: Secondary | ICD-10-CM

## 2016-07-27 DIAGNOSIS — Z9889 Other specified postprocedural states: Secondary | ICD-10-CM | POA: Diagnosis not present

## 2016-07-27 NOTE — Progress Notes (Signed)
Subjective: Aaron Moon is a [age] y.o. is seen today in office s/p left foot reconstruction preformed on 05/28/16. He presents today for an unscheduled appointment due to increased swelling and pain to his left foot. He states that he may been on his feet quite a bit over the week which causes gets swollen. He did try to wear regular shoe but the foot is been swollen. Denies any redness or warmth the foot. Denies any recent injury or trauma.  Objective: General: No acute distress, AAOx3  DP/PT pulses palpable 2/4, CRT < 3 sec to all digits.  Protective sensation intact. Motor function intact.  Left foot: Incision is well coapted without any evidence of dehiscence and a scar has formed over the incisions. There is no surrounding erythema, ascending cellulitis, fluctuance, crepitus, malodor, drainage/purulence. There is moderate edema to the foot. Mild tenderness along the surgical sites. No clinical signs of infection.  No other areas of tenderness to bilateral lower extremities.  No other open lesions or pre-ulcerative lesions.  No pain with calf compression, swelling, warmth, erythema.   Assessment and Plan:  Status post left foot surgery, presents today for increased swelling. Date of surgery 05/28/2016  Plan of care: #1 today the patient was evaluated. Manipulation range of motion was performed to all MPJs of the left foot. #2 compression anklet dispensed #3 today we discussed undergoing surgery to the right lower extremity. Patient is comfortable and confident to proceed with surgery to the right lower extremity. #4 return to clinic 1 week postop right foot surgery

## 2016-07-30 ENCOUNTER — Encounter: Payer: Self-pay | Admitting: Podiatry

## 2016-07-30 DIAGNOSIS — M2041 Other hammer toe(s) (acquired), right foot: Secondary | ICD-10-CM | POA: Diagnosis not present

## 2016-07-30 DIAGNOSIS — M2042 Other hammer toe(s) (acquired), left foot: Secondary | ICD-10-CM | POA: Diagnosis not present

## 2016-07-30 DIAGNOSIS — M216X1 Other acquired deformities of right foot: Secondary | ICD-10-CM | POA: Diagnosis not present

## 2016-07-30 DIAGNOSIS — M2011 Hallux valgus (acquired), right foot: Secondary | ICD-10-CM | POA: Diagnosis not present

## 2016-07-30 DIAGNOSIS — M21541 Acquired clubfoot, right foot: Secondary | ICD-10-CM | POA: Diagnosis not present

## 2016-08-05 ENCOUNTER — Ambulatory Visit (INDEPENDENT_AMBULATORY_CARE_PROVIDER_SITE_OTHER): Payer: Federal, State, Local not specified - PPO

## 2016-08-05 ENCOUNTER — Ambulatory Visit: Payer: Federal, State, Local not specified - PPO | Admitting: Podiatry

## 2016-08-05 DIAGNOSIS — M2010 Hallux valgus (acquired), unspecified foot: Secondary | ICD-10-CM

## 2016-08-05 DIAGNOSIS — Z9889 Other specified postprocedural states: Secondary | ICD-10-CM

## 2016-08-05 DIAGNOSIS — R52 Pain, unspecified: Secondary | ICD-10-CM

## 2016-08-05 MED ORDER — GENTAMICIN SULFATE 0.1 % EX CREA: 1.0000 "application " | TOPICAL_CREAM | Freq: Three times a day (TID) | CUTANEOUS | 0 refills | Status: DC

## 2016-08-05 MED ORDER — OXYCODONE-ACETAMINOPHEN 5-325 MG PO TABS: 1.0000 | ORAL_TABLET | Freq: Three times a day (TID) | ORAL | 0 refills | Status: DC | PRN

## 2016-08-06 NOTE — Telephone Encounter (Signed)
-----   Message from Felecia ShellingBrent M Evans, DPM sent at 08/05/2016  2:27 PM EST ----- Regarding: Home Health Dressing Changes Please arrange home health dressing changes to right foot.  Every other day x 6 weeks.   Dx : s/p right foot reconstructive surgery. DOS 07/30/16  - Cleanse with normal saline. Dry.  - apply gentamicin cream to all incision sites and percutaneous kwires.  - Dress with nonadherant telfa gauze, 4x4 gauze, kerlex, and ace wrap.   Thanks, Dr. Logan BoresEvans

## 2016-08-09 NOTE — Progress Notes (Signed)
Subjective: Patient presents today status post reconstructive forefoot surgery of the right lower extremity. Date of surgery 07/30/2016. Patient states that he is doing very well.   Objective: Skin incisions are well coapted with staples intact. There is a blister to the right great toe secondary to trauma. Stable.  Assessment: Status post reconstructive forefoot surgery right foot. Date of surgery 07/30/2016.  Plan of care: Today patient was evaluated. Dressings were changed. Orders for home health care were placed. Prescription for Percocet 5/325 mg. Prescription for gentamicin cream. Compression anklet dispensed for left lower extremity swelling. Return to clinic in 2 weeks.

## 2016-08-11 ENCOUNTER — Telehealth: Payer: Self-pay | Admitting: *Deleted

## 2016-08-11 NOTE — Telephone Encounter (Addendum)
-----   Message from Felecia ShellingBrent M Evans, DPM sent at 08/09/2016  4:39 PM EST ----- Regarding: Home Health Dressing Changes Please provide home health dressing changes 3 times per week 6 weeks. Patient is homebound.  Dx : s/p reconstructive forefoot surgery right  Orders:  1. Cleanse with normal saline or wound cleanser. Dry. 2. Applied gentamicin cream to all incision sites and percutaneous pin sites.  3. Dressed with nonadherent gauze, 4 x 4 gauze, large Kerlix, Ace wrap.  4. Reapply cam boot  Thanks, Dr. Logan BoresEvans. 08/11/2016-Fax[facility]09[facility]2016 [facility] 08/12/2016-Pt request refill of the oxycodone and states when he had his left foot done in 05/2016 he was taking it every 4 hours so why is it he is to take it every 8 hours and I told pt it was the rx written by Dr. Logan BoresEvans. I told pt he was not able to fill his rx until 08/14/2016 and I would have it ready for him then. Pt agreed and asked if Dr.Evans had anyone coming out to take care of his foot. I told him I faxed the orders yesterday, but I would call and check status. I spoke with Dennison NancyNaomi - [facility] and she states they did not receive to refax. [facility]. 01/04/2018Cory Frances Furbish- [facility] states they are not currently able to accept commercial insurance at this time.I spoke with [facility] staffer working from home due to the weather and she states she will see if they accept [employer] insurance and call again. I spoke with pt and he states he has not had a dressing change since last visit. I transferred pt to schedulers to be scheduled on the Nurses schedule to have a dressing change and pt will pick up his oxycodone at the same time. Octavia HeirJennifer - [facility] states they do accept [employer]. 08/14/2016-Faxed 08/09/2016 [facility]. 08/18/2016-Pt states he wants to talk about home health care. I spoke with pt and he states [facility] Dr[place] Jess BartersBorum wants his notes from Dr. Logan BoresEVans fax to [phone]. I told pt I would  inform our medical records agent. 08/20/2016-Faxed copy of Dr. Logan BoresEvans 08/19/2016 orders for home health aid for bathing, cooking and cleaning to [facility]. 08/21/2016-Pt states his home health care nurse said it appeared his pain is in his sciatica and he needs a referral. 08/24/2016-I informed pt of Dr. Logan BoresEvans statement that the sciatica would subside once he was able to walk more freely. Pt states the home health aide has been once and is scheduled to come in today. 08/28/2016-Pt request refill of his pain medication. Pt states both feet hurt constant, but worse with movement. Dr. Philomena DohenyEvans okayed refill oxycodone as previously. 08/31/2016-Rick Washington - [facility] nurse states pt was admitted 08/30/2016 for stroke and MRI is needed after pt has had "clot buster", wants to know if the implant in his foot is MRI compatible. Dr. Logan BoresEvans states implant is arthrotec and is either titanium or stainless steel. I informed [facility] and he will inform radiology and they will advise. I told Paul DykesRick Washington to have hospital send a consultation request for Dr. Logan BoresEvans. 09/04/2016-Caitlyn [facility] ask for clarification of pt's weight bearing status. I instructed Caitlyn to assist pt with non-weight bearing PT today and I would contact Dr. Logan BoresEvans and call again. Caitlyn states sh can do that.

## 2016-08-12 ENCOUNTER — Encounter: Payer: Federal, State, Local not specified - PPO | Admitting: Podiatry

## 2016-08-12 MED ORDER — OXYCODONE-ACETAMINOPHEN 5-325 MG PO TABS: 1.0000 | ORAL_TABLET | Freq: Three times a day (TID) | ORAL | 0 refills | Status: DC | PRN

## 2016-08-14 ENCOUNTER — Ambulatory Visit (INDEPENDENT_AMBULATORY_CARE_PROVIDER_SITE_OTHER): Payer: Federal, State, Local not specified - PPO | Admitting: Podiatry

## 2016-08-14 VITALS — Temp 97.4°F

## 2016-08-14 DIAGNOSIS — M2010 Hallux valgus (acquired), unspecified foot: Secondary | ICD-10-CM | POA: Diagnosis not present

## 2016-08-14 DIAGNOSIS — Z9889 Other specified postprocedural states: Secondary | ICD-10-CM

## 2016-08-14 MED ORDER — CLINDAMYCIN HCL 300 MG PO CAPS: 300.0000 mg | ORAL_CAPSULE | Freq: Three times a day (TID) | ORAL | 0 refills | Status: DC

## 2016-08-14 NOTE — Progress Notes (Signed)
Subjective: Aaron Moon is a 71 y.o. is seen today in office s/p Right foot reconstruction preformed on 07/30/16 with Dr. Logan BoresEvans. He states he still getting pain to his feet although improving. He is asking for refill pain medication today. He presents for a dressing change however due to concerns of drainage on the bandage evaluated the patient. He is also asking for new cam boot as his worn out.. Denies any systemic complaints such as fevers, chills, nausea, vomiting. No calf pain, chest pain, shortness of breath.   Objective: General: No acute distress, AAOx3  DP/PT pulses palpable 2/4, CRT < 3 sec to all digits.  Protective sensation intact. Motor function intact.  Right foot: Incision is well coapted without any evidence of dehiscence and staples are intact. There is a faint amount of erythema to the foot around the incisions however this is likely more from inflammation as opposed to infection. There is no severe increase in warmth. There is no ascending synovitis. Mild to palpation of the surgical sites. And a bandage there does appear to be yellow color to the drainage. No pus is expressed. Left foot: Incisions are well-healed the scar. Mild tenderness on the surgical sites. The edema appears to be improving. No other areas of tenderness bilaterally.  No other open lesions or pre-ulcerative lesions.  No pain with calf compression, swelling, warmth, erythema.   Assessment and Plan:  Status post right foot surger  -Treatment options discussed including all alternatives, risks, and complications -Patient presents today for dressing change. However given the drainage as well as the slight amount of redness surrounding incisions we will start antibiotics. Prescribed clindamycin 300 mg 3 times a day. -Refill pain medicine today. -Dispensed new cam walker. -Ice/elevation -Pain medication as needed. -Monitor for any clinical signs or symptoms of infection and DVT/PE and directed to call the  office immediately should any occur or go to the ER. -Follow-up as scheduled with Dr. Logan BoresEvans or sooner if any problems arise. In the meantime, encouraged to call the office with any questions, concerns, change in symptoms.   Ovid CurdMatthew Jerre Vandrunen, DPM

## 2016-08-19 ENCOUNTER — Ambulatory Visit (INDEPENDENT_AMBULATORY_CARE_PROVIDER_SITE_OTHER): Payer: Self-pay | Admitting: Podiatry

## 2016-08-19 DIAGNOSIS — Z9889 Other specified postprocedural states: Secondary | ICD-10-CM

## 2016-08-19 MED ORDER — OXYCODONE-ACETAMINOPHEN 5-325 MG PO TABS: 1.0000 | ORAL_TABLET | Freq: Three times a day (TID) | ORAL | 0 refills | Status: DC | PRN

## 2016-08-19 MED ORDER — GENTAMICIN SULFATE 0.1 % EX CREA: 1.0000 "application " | TOPICAL_CREAM | Freq: Three times a day (TID) | CUTANEOUS | 1 refills | Status: DC

## 2016-08-19 NOTE — Telephone Encounter (Signed)
I will fax updated records to Dr. Jess BartersBorum per request.

## 2016-08-20 NOTE — Telephone Encounter (Signed)
-----   Message from Felecia ShellingBrent M Evans, DPM sent at 08/19/2016  2:26 PM EST ----- Regarding: Home Health Aid Can we approve patient for a Home Health Aid? Patient is homebound, non-weight bearing right lower extremity, and unable to perform activities of daily living including:  - bathing, cooking, cleaning, etc  Thanks, Dr. Logan BoresEvans

## 2016-08-20 NOTE — Telephone Encounter (Deleted)
-----   Message from Brent M Evans, DPM sent at 08/19/2016  2:26 PM EST ----- Regarding: Home Health Aid Can we approve patient for a Home Health Aid? Patient is homebound, non-weight bearing right lower extremity, and unable to perform activities of daily living including:  - bathing, cooking, cleaning, etc  Thanks, Dr. Evans 

## 2016-08-23 NOTE — Progress Notes (Signed)
Subjective: Patient presents today status post forefoot reconstruction the right lower extremity. Date of surgery 07/30/2016. Patient states that he is doing very well.  Objective: Skin incisions are well coapted. Sutures are intact. Moderate amount of edema noted.  Assessment: Status post forefoot reconstructive surgery. Date of surgery 07/30/2016.  Plan of care: Today the patient was evaluated. Continue nonweightbearing in a cam boot. Orders for home health aide are provided. Patient states that he is having trouble with activities of daily living including cooking, cleaning, etc. Continue home health dressing changes every other day. Prescription for gentamicin cream. Return to clinic in 4 weeks

## 2016-08-24 NOTE — Telephone Encounter (Signed)
Perfect. Thank you!

## 2016-08-24 NOTE — Telephone Encounter (Signed)
His sciatica should subside when he starts becoming ambulatory. No need for referral at the moment. I'll evaluate on his next appt.   Dr. Logan BoresEvans

## 2016-08-28 MED ORDER — OXYCODONE-ACETAMINOPHEN 5-325 MG PO TABS: 1.0000 | ORAL_TABLET | Freq: Three times a day (TID) | ORAL | 0 refills | Status: DC | PRN

## 2016-08-30 ENCOUNTER — Encounter (HOSPITAL_COMMUNITY): Payer: Self-pay | Admitting: *Deleted

## 2016-08-30 ENCOUNTER — Emergency Department (HOSPITAL_COMMUNITY): Payer: Federal, State, Local not specified - PPO

## 2016-08-30 ENCOUNTER — Inpatient Hospital Stay (HOSPITAL_COMMUNITY)
Admission: EM | Admit: 2016-08-30 | Discharge: 2016-09-02 | DRG: 041 | Disposition: A | Discharge status: Rehabilitation Facility | Payer: Federal, State, Local not specified - PPO | Attending: Neurology | Admitting: Neurology

## 2016-08-30 ENCOUNTER — Inpatient Hospital Stay (HOSPITAL_COMMUNITY): Payer: Federal, State, Local not specified - PPO

## 2016-08-30 DIAGNOSIS — Z820 Family history of epilepsy and other diseases of the nervous system: Secondary | ICD-10-CM | POA: Diagnosis not present

## 2016-08-30 DIAGNOSIS — E875 Hyperkalemia: Secondary | ICD-10-CM | POA: Diagnosis present

## 2016-08-30 DIAGNOSIS — D72829 Elevated white blood cell count, unspecified: Secondary | ICD-10-CM

## 2016-08-30 DIAGNOSIS — D62 Acute posthemorrhagic anemia: Secondary | ICD-10-CM | POA: Diagnosis not present

## 2016-08-30 DIAGNOSIS — I63512 Cerebral infarction due to unspecified occlusion or stenosis of left middle cerebral artery: Secondary | ICD-10-CM | POA: Diagnosis not present

## 2016-08-30 DIAGNOSIS — R402252 Coma scale, best verbal response, oriented, at arrival to emergency department: Secondary | ICD-10-CM | POA: Diagnosis present

## 2016-08-30 DIAGNOSIS — F5101 Primary insomnia: Secondary | ICD-10-CM | POA: Diagnosis not present

## 2016-08-30 DIAGNOSIS — I69393 Ataxia following cerebral infarction: Secondary | ICD-10-CM

## 2016-08-30 DIAGNOSIS — I63412 Cerebral infarction due to embolism of left middle cerebral artery: Secondary | ICD-10-CM | POA: Diagnosis present

## 2016-08-30 DIAGNOSIS — Z87891 Personal history of nicotine dependence: Secondary | ICD-10-CM | POA: Diagnosis not present

## 2016-08-30 DIAGNOSIS — Z6841 Body Mass Index (BMI) 40.0 and over, adult: Secondary | ICD-10-CM

## 2016-08-30 DIAGNOSIS — S76311A Strain of muscle, fascia and tendon of the posterior muscle group at thigh level, right thigh, initial encounter: Secondary | ICD-10-CM | POA: Diagnosis not present

## 2016-08-30 DIAGNOSIS — I10 Essential (primary) hypertension: Secondary | ICD-10-CM | POA: Diagnosis present

## 2016-08-30 DIAGNOSIS — Z7982 Long term (current) use of aspirin: Secondary | ICD-10-CM | POA: Diagnosis not present

## 2016-08-30 DIAGNOSIS — M1000 Idiopathic gout, unspecified site: Secondary | ICD-10-CM | POA: Diagnosis present

## 2016-08-30 DIAGNOSIS — R29810 Facial weakness: Secondary | ICD-10-CM | POA: Diagnosis present

## 2016-08-30 DIAGNOSIS — E1159 Type 2 diabetes mellitus with other circulatory complications: Secondary | ICD-10-CM | POA: Diagnosis not present

## 2016-08-30 DIAGNOSIS — R29898 Other symptoms and signs involving the musculoskeletal system: Secondary | ICD-10-CM

## 2016-08-30 DIAGNOSIS — E1142 Type 2 diabetes mellitus with diabetic polyneuropathy: Secondary | ICD-10-CM | POA: Diagnosis not present

## 2016-08-30 DIAGNOSIS — I351 Nonrheumatic aortic (valve) insufficiency: Secondary | ICD-10-CM | POA: Diagnosis present

## 2016-08-30 DIAGNOSIS — R402142 Coma scale, eyes open, spontaneous, at arrival to emergency department: Secondary | ICD-10-CM | POA: Diagnosis present

## 2016-08-30 DIAGNOSIS — Z791 Long term (current) use of non-steroidal anti-inflammatories (NSAID): Secondary | ICD-10-CM

## 2016-08-30 DIAGNOSIS — I6502 Occlusion and stenosis of left vertebral artery: Secondary | ICD-10-CM | POA: Diagnosis present

## 2016-08-30 DIAGNOSIS — R0989 Other specified symptoms and signs involving the circulatory and respiratory systems: Secondary | ICD-10-CM | POA: Diagnosis not present

## 2016-08-30 DIAGNOSIS — R402362 Coma scale, best motor response, obeys commands, at arrival to emergency department: Secondary | ICD-10-CM | POA: Diagnosis present

## 2016-08-30 DIAGNOSIS — R4701 Aphasia: Secondary | ICD-10-CM | POA: Diagnosis not present

## 2016-08-30 DIAGNOSIS — I6300 Cerebral infarction due to thrombosis of unspecified precerebral artery: Secondary | ICD-10-CM | POA: Diagnosis not present

## 2016-08-30 DIAGNOSIS — Z800 Family history of malignant neoplasm of digestive organs: Secondary | ICD-10-CM | POA: Diagnosis not present

## 2016-08-30 DIAGNOSIS — I6789 Other cerebrovascular disease: Secondary | ICD-10-CM | POA: Diagnosis not present

## 2016-08-30 DIAGNOSIS — G8191 Hemiplegia, unspecified affecting right dominant side: Secondary | ICD-10-CM | POA: Diagnosis present

## 2016-08-30 DIAGNOSIS — R413 Other amnesia: Secondary | ICD-10-CM | POA: Diagnosis not present

## 2016-08-30 DIAGNOSIS — I639 Cerebral infarction, unspecified: Secondary | ICD-10-CM | POA: Diagnosis not present

## 2016-08-30 DIAGNOSIS — Z95818 Presence of other cardiac implants and grafts: Secondary | ICD-10-CM | POA: Diagnosis not present

## 2016-08-30 DIAGNOSIS — I5189 Other ill-defined heart diseases: Secondary | ICD-10-CM

## 2016-08-30 DIAGNOSIS — E119 Type 2 diabetes mellitus without complications: Secondary | ICD-10-CM

## 2016-08-30 DIAGNOSIS — F4323 Adjustment disorder with mixed anxiety and depressed mood: Secondary | ICD-10-CM

## 2016-08-30 DIAGNOSIS — E1151 Type 2 diabetes mellitus with diabetic peripheral angiopathy without gangrene: Secondary | ICD-10-CM | POA: Diagnosis present

## 2016-08-30 DIAGNOSIS — Z8739 Personal history of other diseases of the musculoskeletal system and connective tissue: Secondary | ICD-10-CM

## 2016-08-30 DIAGNOSIS — I63413 Cerebral infarction due to embolism of bilateral middle cerebral arteries: Secondary | ICD-10-CM | POA: Diagnosis not present

## 2016-08-30 DIAGNOSIS — Z9889 Other specified postprocedural states: Secondary | ICD-10-CM

## 2016-08-30 DIAGNOSIS — E785 Hyperlipidemia, unspecified: Secondary | ICD-10-CM | POA: Diagnosis present

## 2016-08-30 DIAGNOSIS — E46 Unspecified protein-calorie malnutrition: Secondary | ICD-10-CM | POA: Diagnosis not present

## 2016-08-30 DIAGNOSIS — R531 Weakness: Secondary | ICD-10-CM | POA: Diagnosis present

## 2016-08-30 LAB — COMPREHENSIVE METABOLIC PANEL
ALK PHOS: 61 U/L (ref 38–126)
ALT: 38 U/L (ref 17–63)
ANION GAP: 9 (ref 5–15)
AST: 48 U/L — ABNORMAL HIGH (ref 15–41)
Albumin: 4.2 g/dL (ref 3.5–5.0)
BUN: 12 mg/dL (ref 6–20)
CALCIUM: 8.9 mg/dL (ref 8.9–10.3)
CO2: 21 mmol/L — ABNORMAL LOW (ref 22–32)
CREATININE: 1.07 mg/dL (ref 0.61–1.24)
Chloride: 106 mmol/L (ref 101–111)
Glucose, Bld: 122 mg/dL — ABNORMAL HIGH (ref 65–99)
Potassium: 5.3 mmol/L — ABNORMAL HIGH (ref 3.5–5.1)
Sodium: 136 mmol/L (ref 135–145)
Total Bilirubin: 1 mg/dL (ref 0.3–1.2)
Total Protein: 7.3 g/dL (ref 6.5–8.1)

## 2016-08-30 LAB — I-STAT TROPONIN, ED: TROPONIN I, POC: 0.01 ng/mL (ref 0.00–0.08)

## 2016-08-30 LAB — CBC
HEMATOCRIT: 41.8 % (ref 39.0–52.0)
Hemoglobin: 13.8 g/dL (ref 13.0–17.0)
MCH: 28.2 pg (ref 26.0–34.0)
MCHC: 33 g/dL (ref 30.0–36.0)
MCV: 85.5 fL (ref 78.0–100.0)
Platelets: 257 10*3/uL (ref 150–400)
RBC: 4.89 MIL/uL (ref 4.22–5.81)
RDW: 13.9 % (ref 11.5–15.5)
WBC: 11.8 10*3/uL — AB (ref 4.0–10.5)

## 2016-08-30 LAB — APTT: aPTT: 28 seconds (ref 24–36)

## 2016-08-30 LAB — DIFFERENTIAL
BASOS PCT: 0 %
Basophils Absolute: 0 10*3/uL (ref 0.0–0.1)
EOS PCT: 1 %
Eosinophils Absolute: 0.2 10*3/uL (ref 0.0–0.7)
LYMPHS PCT: 29 %
Lymphs Abs: 3.5 10*3/uL (ref 0.7–4.0)
MONO ABS: 1.1 10*3/uL — AB (ref 0.1–1.0)
MONOS PCT: 10 %
NEUTROS ABS: 7.1 10*3/uL (ref 1.7–7.7)
Neutrophils Relative %: 60 %

## 2016-08-30 LAB — CBG MONITORING, ED: Glucose-Capillary: 132 mg/dL — ABNORMAL HIGH (ref 65–99)

## 2016-08-30 LAB — PROTIME-INR
INR: 0.96
Prothrombin Time: 12.8 seconds (ref 11.4–15.2)

## 2016-08-30 MED ORDER — STROKE: EARLY STAGES OF RECOVERY BOOK
Freq: Once | Status: AC
  Administered 2016-08-30: 21:00:00
  Filled 2016-08-30: qty 1

## 2016-08-30 MED ORDER — SODIUM CHLORIDE 0.9 % IV SOLN: 50.0000 mL | Freq: Once | INTRAVENOUS | Status: DC

## 2016-08-30 MED ORDER — ALTEPLASE (STROKE) FULL DOSE INFUSION
90.0000 mg | Freq: Once | INTRAVENOUS | Status: AC
  Filled 2016-08-30: qty 100

## 2016-08-30 MED ORDER — ACETAMINOPHEN 650 MG RE SUPP: 650.0000 mg | RECTAL | Status: DC | PRN

## 2016-08-30 MED ORDER — IOPAMIDOL (ISOVUE-370) INJECTION 76%
INTRAVENOUS | Status: AC
  Administered 2016-08-30: 50 mL via INTRAVENOUS
  Filled 2016-08-30: qty 50

## 2016-08-30 MED ORDER — SODIUM CHLORIDE 0.9 % IV SOLN
INTRAVENOUS | Status: DC
  Administered 2016-08-30 – 2016-09-01 (×4): via INTRAVENOUS

## 2016-08-30 MED ORDER — ACETAMINOPHEN 325 MG PO TABS
650.0000 mg | ORAL_TABLET | ORAL | Status: DC | PRN
  Administered 2016-09-01: 650 mg via ORAL
  Filled 2016-08-30: qty 2

## 2016-08-30 MED ORDER — ACETAMINOPHEN 160 MG/5ML PO SOLN
650.0000 mg | ORAL | Status: DC | PRN
Start: 2016-08-30 — End: 2016-09-02

## 2016-08-30 MED ORDER — PANTOPRAZOLE SODIUM 40 MG IV SOLR
40.0000 mg | Freq: Every day | INTRAVENOUS | Status: DC
  Administered 2016-08-30: 40 mg via INTRAVENOUS
  Filled 2016-08-30: qty 40

## 2016-08-30 NOTE — ED Triage Notes (Signed)
Pt states he was watching TV approx 1 hour ago and reports onset of weakness in right hand/arm, unable to grip fully, and has a drift. Pt taken directly to CT scan and dr. Joni Fearslui notified of pt symptoms. Secretary notified to activate code stroke.

## 2016-08-30 NOTE — ED Provider Notes (Signed)
[facility]-EMERGENCY DEPT Provider Note   CSN: [id number] Arrival date & time: 08/30/16  1818     History   Chief Complaint Chief Complaint  Patient presents with  . Weakness  . Code Stroke    HPI Aaron Moon is a 71 y.o. male.  HPI 71 year old male who presents with right arm weakness. History of obesity and HLD. States while watching football game at 5:30 PM his right arm went limp and numb. No leg numbness/weakness, facial droop or numbness, vision changes, speech changes, chest pain, back pain, syncope/near syncope, difficulty breathing. He has otherwise been in his usual state of health.   Past Medical History:  Diagnosis Date  . Gout   . Hyperlipidemia   . Obesity     Patient Active Problem List   Diagnosis Date Noted  . 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 Hammertoe of right foot 07/20/2016  . Hallux valgus, acquired, bilateral 02/19/2014  . Gout 02/13/2013  . Hyperlipidemia 01/18/2012  . Obesity 01/18/2012    Past Surgical History:  Procedure Laterality Date  . ABDOMINAL SURGERY     colostomy & colostomy reversal  . ANKLE SURGERY         Home Medications    Prior to Admission medications   Medication Sig Start Date End Date Taking? Authorizing Provider  albuterol (PROVENTIL HFA;VENTOLIN HFA) 108 (90 BASE) MCG/ACT inhaler Inhale 2 puffs into the lungs every 6 (six) hours as needed for wheezing. 09/28/12   Peyton Najjaravid H Hopper, MD  allopurinol (ZYLOPRIM) 100 MG tablet Take 100 mg by mouth daily.    Historical Provider, MD  ALPRAZolam Prudy Feeler(XANAX) 0.5 MG tablet Take 1 tablet (0.5 mg total) by mouth at bedtime as needed. 09/28/12   Peyton Najjaravid H Hopper, MD  aspirin 325 MG tablet Take 325 mg by mouth daily.    Historical Provider, MD  cholecalciferol (VITAMIN D) 1000 UNITS tablet Take 1,000 Units by mouth daily.    Historical Provider, MD  ciprofloxacin (CIPRO) 500 MG tablet Take 1 tablet (500 mg total) by mouth 2 (two) times daily. 06/11/16   Felecia ShellingBrent M Evans, DPM    clindamycin (CLEOCIN) 300 MG capsule Take 1 capsule (300 mg total) by mouth 3 (three) times daily. 08/14/16   Vivi BarrackMatthew R Wagoner, DPM  fish oil-omega-3 fatty acids 1000 MG capsule Take 2 g by mouth daily.    Historical Provider, MD  gentamicin cream (GARAMYCIN) 0.1 % Apply 1 application topically 3 (three) times daily. 08/19/16   Felecia ShellingBrent M Evans, DPM  ibuprofen (ADVIL,MOTRIN) 800 MG tablet Take 800 mg by mouth every 8 (eight) hours as needed for pain.    Historical Provider, MD  nabumetone (RELAFEN) 750 MG tablet Take 750 mg by mouth 2 (two) times daily.    Felecia ShellingBrent M Evans, DPM  omeprazole (PRILOSEC) 20 MG capsule Take 20 mg by mouth daily.    Historical Provider, MD  oxyCODONE-acetaminophen (ROXICET) 5-325 MG tablet Take 1 tablet by mouth every 8 (eight) hours as needed for severe pain. 08/19/16   Felecia ShellingBrent M Evans, DPM  oxyCODONE-acetaminophen (ROXICET) 5-325 MG tablet Take 1 tablet by mouth every 8 (eight) hours as needed for severe pain. 08/28/16   Felecia ShellingBrent M Evans, DPM  simvastatin (ZOCOR) 40 MG tablet Take 40 mg by mouth every evening.    Historical Provider, MD  sulfamethoxazole-trimethoprim (BACTRIM DS,SEPTRA DS) 800-160 MG tablet Take 1 tablet by mouth 2 (two) times daily. 06/11/16   Felecia ShellingBrent M Evans, DPM    Family History History  reviewed. No pertinent family history.  Social History Social History  Substance Use Topics  . Smoking status: Former Games developer  . Smokeless tobacco: Never Used  . Alcohol use Yes     Allergies   Patient has no known allergies.   Review of Systems Review of Systems 10/14 systems reviewed and are negative other than those stated in the HPI   Physical Exam Updated Vital Signs BP 166/95   Pulse 89   Temp 98.2 F (36.8 C)   Resp 15   Ht 5\' 7"  (1.702 m)   Wt 275 lb (124.7 kg)   SpO2 90%   BMI 43.07 kg/m   Physical Exam Physical Exam  Nursing note and vitals reviewed. Constitutional: non-toxic, and in no acute distress Head: Normocephalic and atraumatic.   Mouth/Throat: Oropharynx is clear and moist.  Neck: Normal range of motion. Neck supple.  Cardiovascular: Normal rate and regular rhythm.   Pulmonary/Chest: Effort normal and breath sounds normal.  Abdominal: Soft. There is no tenderness. There is no rebound and no guarding.  Musculoskeletal: Normal range of motion.  Skin: Skin is warm and dry.  Psychiatric: Cooperative  Neurological:  Alert, oriented to person, place, time, and situation. Memory grossly in tact. Fluent speech. No dysarthria or aphasia.  Cranial nerves: VF are full. Pupils are symmetric, and reactive to light. EOMI without nystagmus. No gaze deviation. Facial muscles symmetric with activation. Sensation to light touch over face in tact bilaterally. Hearing grossly in tact. Palate elevates symmetrically. Head turn and shoulder shrug are intact. Tongue midline.  Reflexes defered.  Muscle bulk and tone normal. No pronator drift. Moves all extremities symmetrically. Sensation to light touch is in tact throughout in bilateral upper and lower extremities. Coordination reveals no dysmetria with finger to nose. Gait is narrow-based and steady. Non-ataxic.   ED Treatments / Results  Labs (all labs ordered are listed, but only abnormal results are displayed) Labs Reviewed  CBC - Abnormal; Notable for the following:       Result Value   WBC 11.8 (*)    All other components within normal limits  DIFFERENTIAL - Abnormal; Notable for the following:    Monocytes Absolute 1.1 (*)    All other components within normal limits  CBG MONITORING, ED - Abnormal; Notable for the following:    Glucose-Capillary 132 (*)    All other components within normal limits  PROTIME-INR  APTT  COMPREHENSIVE METABOLIC PANEL  I-STAT TROPOININ, ED  I-STAT CHEM 8, ED    EKG  EKG Interpretation  Date/Time:  Sunday August 30 2016 18:46:03 EST Ventricular Rate:  91 PR Interval:    QRS Duration: 94 QT Interval:  372 QTC Calculation: 458 R  Axis:   -9 Text Interpretation:  Sinus rhythm Low voltage, precordial leads Baseline wander in lead(s) V3 no acute changes  Confirmed by Kasi Lasky MD, Ella ([id number]) on 08/30/2016 6:54:56 PM       Radiology Ct Head Code Stroke W/o Cm  Result Date: 08/30/2016 CLINICAL DATA:  Code stroke.  Right upper extremity weakness. EXAM: CT HEAD WITHOUT CONTRAST TECHNIQUE: Contiguous axial images were obtained from the base of the skull through the vertex without intravenous contrast. COMPARISON:  None. FINDINGS: Brain: There is no evidence of acute cortical infarct, intracranial hemorrhage, mass, midline shift, or extra-axial fluid collection. Mild generalized cerebral atrophy is within normal limits for age. A small subcortical infarct in the left parietal lobe appears chronic, as does a lacunar infarct at/ just inferior to the left  caudate head. Cerebral white matter hypodensities elsewhere are nonspecific but compatible with mild chronic small vessel ischemic disease. Vascular: Calcified atherosclerosis at the skullbase. No hyperdense vessel. Skull: No fracture or focal osseous lesion. Sinuses/Orbits: Visualized paranasal sinuses and mastoid air cells are clear. Orbits are unremarkable. Other: None. ASPECTS [facility] Stroke Program Early CT Score) - Ganglionic level infarction (caudate, lentiform nuclei, internal capsule, insula, M1-M3 cortex): 7 - Supraganglionic infarction (M4-M6 cortex): 3 Total score (0-10 with 10 being normal): 10 IMPRESSION: 1. No evidence of acute intracranial abnormality. 2. ASPECTS is 10. 3. Mild chronic small vessel ischemic disease including chronic lacunar infarcts as above. These results were called by telephone at the time of interpretation on 08/30/2016 at 7:01 pm to Dr. Crista Curb , who verbally acknowledged these results. Electronically Signed   By: Sebastian Ache M.D.   On: 08/30/2016 19:02    Procedures Procedures (including critical care time) CRITICAL CARE Performed by: Lavera Guise   Total critical care time: 35 minutes  Critical care time was exclusive of separately billable procedures and treating other patients.  Critical care was necessary to treat or prevent imminent or life-threatening deterioration.  Critical care was time spent personally by me on the following activities: development of treatment plan with patient and/or surrogate as well as nursing, discussions with consultants, evaluation of patient's response to treatment, examination of patient, obtaining history from patient or surrogate, ordering and performing treatments and interventions, ordering and review of laboratory studies, ordering and review of radiographic studies, pulse oximetry and re-evaluation of patient's condition.  Medications Ordered in ED Medications  alteplase (ACTIVASE) 1 mg/mL infusion 90 mg (not administered)    Followed by  0.9 %  sodium chloride infusion (not administered)     Initial Impression / Assessment and Plan / ED Course  I have reviewed the triage vital signs and the nursing notes.  Pertinent labs & imaging results that were available during my care of the patient were reviewed by me and considered in my medical decision making (see chart for details).     Acute onset right arm weakness and numbness at 5:30PM. Code stroke activated. CT head visualized and negative for ICH. Exam concerning for significant right arm numbness and weakness. No other neuro deficits. Pulses palpable. No arm pain. No contraindications for tPA. Discussed with Dr. Amada Jupiter via phone. Recommending pushing tPA. Benefits and risk discussed with patient, who has expressed wishes to receive this medication. Plan to administer tPA and transfer to [facility] to neuro ICU.  Final Clinical Impressions(s) / ED Diagnoses   Final diagnoses:  Acute ischemic stroke [facility])    New Prescriptions New Prescriptions   No medications on file     Lavera Guise, MD 08/30/16 1908

## 2016-08-30 NOTE — H&P (Addendum)
Neurology H&P Reason for Consult: Right sided weakness Referring Physician: Verdie Mosher, D  CC: Right arm weakness  History is obtained from:PAtient  HPI: Aaron Moon is a 71 y.o. male who was in his normal state of health until 5:30 pm tonight. He was watching TV when he had sudden onset right hand weakness. He went to [facility] ED where a code stroke was called, and given his deficits tPA was administered prior to transfer. On arrival here, he has Arm >>> leg and face weakness.    LKW: 5:30 pm tpa given?: yes   ROS: A 14 point ROS was performed and is negative except as noted in the HPI.   Past Medical History:  Diagnosis Date  . Gout   . Hyperlipidemia   . Obesity      FHx: no hx stroke   Social History:  reports that he has quit smoking. He has never used smokeless tobacco. He reports that he drinks alcohol. He reports that he does not use drugs.   Exam: Current vital signs: BP 152/88   Pulse 93   Temp 98.2 F (36.8 C)   Resp 13   Ht 5\' 7"  (1.702 m)   Wt 124.7 kg (275 lb)   SpO2 98%   BMI 43.07 kg/m  Vital signs in last 24 hours: Temp:  [98.2 F (36.8 C)] 98.2 F (36.8 C) (01/21 1851) Pulse Rate:  [89-93] 93 (01/21 1945) Resp:  [13-20] 13 (01/21 1945) BP: (152-180)/(88-95) 152/88 (01/21 1945) SpO2:  [90 %-98 %] 98 % (01/21 1945) Weight:  [124.7 kg (275 lb)] 124.7 kg (275 lb) (01/21 1845)   Physical Exam  Constitutional: Appears well-developed and well-nourished.  Psych: Affect appropriate to situation Eyes: No scleral injection HENT: No OP obstrucion Head: Normocephalic.  Cardiovascular: Normal rate and regular rhythm.  Respiratory: Effort normal  GI: obese, soft.  Skin: WDI  Neuro: Mental Status: Patient is awake, alert, oriented to person, place, month, year, and situation. Patient is able to give a clear and coherent history. No signs of aphasia or neglect Cranial Nerves: II: Visual Fields are full. Pupils are equal, round, and reactive to light.    III,IV, VI: EOMI without ptosis or diploplia.  V: Facial sensation is symmetric to temperature VII: Facial movement with mild decreased NL fold on the right.  VIII: hearing is intact to voice X: Uvula elevates symmetrically XI: Shoulder shrug is symmetric. XII: tongue is midline without atrophy or fasciculations.  Motor: Tone is normal. Bulk is normal. 5/5 strength was present  On the left, he has marked(3/5) weakness and dyscoordination of the right arm. This extends across multiple myotomes and dermatomes. His right leg is limited due to recent surgery, but he does appear to have mild 4+/5 weakness of the left leg as well.  Sensory: Sensation is diminished in the right arm to pin.  Cerebellar: FNF intact on left.    I have reviewed labs in epic and the results pertinent to this consultation are: Normal creatinine, borderline high Potassium   I have reviewed the images obtained:CT head- unremarkable.   Impression: 71 yo M with likely small ischemic infarct. He has received IV tpa. He will need admission to the ICU for close post-tpa management.   Recommendations: 1. HgbA1c, fasting lipid panel 2. MRI  of the brain without contrast 3. Frequent neuro checks 4. Echocardiogram 5. CTA head and neck 6. Prophylactic therapy-none for 24 hours 7. Risk factor modification 8. Telemetry monitoring 9. PT consult, OT consult, Speech  consult 10. Stroke team to follow.  11. Hyperkalemia, will hydrate with NS overnight, recheck in the AM.   This patient is critically ill and at significant risk of neurological worsening, death and care requires constant monitoring of vital signs, hemodynamics,respiratory and cardiac monitoring, neurological assessment, discussion with family, other specialists and medical decision making of high complexity. I spent 35 minutes of neurocritical care time  in the care of  this patient.  Ritta SlotMcNeill Mathan Darroch, MD [facility] [phone]  If 7pm-  7am, please page neurology on call as listed in AMION. 08/30/2016  8:05 PM

## 2016-08-30 NOTE — ED Notes (Signed)
Activase being administered by CARELINK during transport.

## 2016-08-30 NOTE — ED Triage Notes (Signed)
Patient presents to [facility]-ED complaining of weakness and right arm numbness. Family reports slurred speech. Occurred approximately 1 hour ago (last known well 1725). Family concerned for stroke and transported to ED. MD notified. Code Stroke activated.

## 2016-08-30 NOTE — ED Triage Notes (Signed)
Pt went to CT prior to coming to room.

## 2016-08-30 NOTE — ED Provider Notes (Signed)
[facility]-EMERGENCY DEPT Provider Note   CSN: [id number] Arrival date & time: 08/30/16  1818     History   Chief Complaint Chief Complaint  Patient presents with  . Weakness  . Code Stroke    HPI Vikas Wegmann is a 71 y.o. male.  Patient presents as transfer from [facility] to see neurology after code stroke. Patient had onset 5:30 PM of right arm weakness. Patient had assessment for [facility] and TPA was started. Patient is transferred over for further specialty evaluation. Patient feels mild improvement since TPA started. No vomiting or headache currently.      Past Medical History:  Diagnosis Date  . Gout   . Hyperlipidemia   . Obesity     Patient Active Problem List   Diagnosis Date Noted  . CVA (cerebral vascular accident) (HCC) 08/30/2016  . Stroke (cerebrum) (HCC) 08/30/2016  . 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 Hammertoe of right foot 07/20/2016  . Hallux valgus, acquired, bilateral 02/19/2014  . Gout 02/13/2013  . Hyperlipidemia 01/18/2012  . Obesity 01/18/2012    Past Surgical History:  Procedure Laterality Date  . ABDOMINAL SURGERY     colostomy & colostomy reversal  . ANKLE SURGERY         Home Medications    Prior to Admission medications   Medication Sig Start Date End Date Taking? Authorizing Provider  gentamicin cream (GARAMYCIN) 0.1 % Apply 1 application topically 3 (three) times daily. Patient taking differently: Apply 1 application topically daily as needed (wound care/dressing change).  08/19/16  Yes Felecia Shelling, DPM  meloxicam (MOBIC) 15 MG tablet Take 15 mg by mouth daily. 07/31/16  Yes Historical Provider, MD  omeprazole (PRILOSEC) 20 MG capsule Take 20 mg by mouth daily as needed (acid reflux).    Yes Historical Provider, MD  oxyCODONE-acetaminophen (ROXICET) 5-325 MG tablet Take 1 tablet by mouth every 8 (eight) hours as needed for severe pain. 08/28/16  Yes Felecia Shelling, DPM  traZODone (DESYREL) 100 MG tablet  Take 100 mg by mouth at bedtime.   Yes Historical Provider, MD  albuterol (PROVENTIL HFA;VENTOLIN HFA) 108 (90 BASE) MCG/ACT inhaler Inhale 2 puffs into the lungs every 6 (six) hours as needed for wheezing. Patient not taking: Reported on 08/30/2016 09/28/12   Peyton Najjar, MD  ALPRAZolam Prudy Feeler) 0.5 MG tablet Take 1 tablet (0.5 mg total) by mouth at bedtime as needed. Patient not taking: Reported on 08/30/2016 09/28/12   Peyton Najjar, MD  oxyCODONE-acetaminophen (ROXICET) 5-325 MG tablet Take 1 tablet by mouth every 8 (eight) hours as needed for severe pain. Patient not taking: Reported on 08/30/2016 08/19/16   Felecia Shelling, DPM    Family History History reviewed. No pertinent family history.  Social History Social History  Substance Use Topics  . Smoking status: Former Games developer  . Smokeless tobacco: Never Used  . Alcohol use Yes     Allergies   Patient has no known allergies.   Review of Systems Review of Systems  Constitutional: Negative for chills and fever.  HENT: Negative for congestion.   Eyes: Negative for visual disturbance.  Respiratory: Negative for shortness of breath.   Cardiovascular: Negative for chest pain.  Gastrointestinal: Negative for abdominal pain and vomiting.  Genitourinary: Negative for dysuria and flank pain.  Musculoskeletal: Negative for back pain, neck pain and neck stiffness.  Skin: Negative for rash.  Neurological: Positive for weakness and numbness. Negative for light-headedness and headaches.  Physical Exam Updated Vital Signs BP 146/90   Pulse 86   Temp 98.2 F (36.8 C)   Resp 16   Ht 5\' 7"  (1.702 m)   Wt 275 lb (124.7 kg)   SpO2 99%   BMI 43.07 kg/m   Physical Exam  Constitutional: He appears well-developed and well-nourished.  HENT:  Head: Normocephalic and atraumatic.  Eyes: Conjunctivae are normal.  Neck: Neck supple.  Cardiovascular: Normal rate.   Pulmonary/Chest: Effort normal.  Abdominal: Soft.  Musculoskeletal:  He exhibits no edema.  Neurological: He is alert. GCS eye subscore is 4. GCS verbal subscore is 5. GCS motor subscore is 6.  Weakness right arm.  Nl 5+ strength remaining extremities.  No slurred speech.  PERRL.   Skin: Skin is warm and dry.  Psychiatric: He has a normal mood and affect.  Nursing note and vitals reviewed.    ED Treatments / Results  Labs (all labs ordered are listed, but only abnormal results are displayed) Labs Reviewed  CBC - Abnormal; Notable for the following:       Result Value   WBC 11.8 (*)    All other components within normal limits  DIFFERENTIAL - Abnormal; Notable for the following:    Monocytes Absolute 1.1 (*)    All other components within normal limits  COMPREHENSIVE METABOLIC PANEL - Abnormal; Notable for the following:    Potassium 5.3 (*)    CO2 21 (*)    Glucose, Bld 122 (*)    AST 48 (*)    All other components within normal limits  CBG MONITORING, ED - Abnormal; Notable for the following:    Glucose-Capillary 132 (*)    All other components within normal limits  PROTIME-INR  APTT  I-STAT TROPOININ, ED  I-STAT CHEM 8, ED    EKG  EKG Interpretation  Date/Time:  Sunday August 30 2016 18:46:03 EST Ventricular Rate:  91 PR Interval:    QRS Duration: 94 QT Interval:  372 QTC Calculation: 458 R Axis:   -9 Text Interpretation:  Sinus rhythm Low voltage, precordial leads Baseline wander in lead(s) V3 no acute changes  Confirmed by LIU MD, Braylee ([id number]) on 08/30/2016 6:54:56 PM       Radiology Ct Head Code Stroke W/o Cm  Result Date: 08/30/2016 CLINICAL DATA:  Code stroke.  Right upper extremity weakness. EXAM: CT HEAD WITHOUT CONTRAST TECHNIQUE: Contiguous axial images were obtained from the base of the skull through the vertex without intravenous contrast. COMPARISON:  None. FINDINGS: Brain: There is no evidence of acute cortical infarct, intracranial hemorrhage, mass, midline shift, or extra-axial fluid collection. Mild generalized  cerebral atrophy is within normal limits for age. A small subcortical infarct in the left parietal lobe appears chronic, as does a lacunar infarct at/ just inferior to the left caudate head. Cerebral white matter hypodensities elsewhere are nonspecific but compatible with mild chronic small vessel ischemic disease. Vascular: Calcified atherosclerosis at the skullbase. No hyperdense vessel. Skull: No fracture or focal osseous lesion. Sinuses/Orbits: Visualized paranasal sinuses and mastoid air cells are clear. Orbits are unremarkable. Other: None. ASPECTS [facility] Stroke Program Early CT Score) - Ganglionic level infarction (caudate, lentiform nuclei, internal capsule, insula, M1-M3 cortex): 7 - Supraganglionic infarction (M4-M6 cortex): 3 Total score (0-10 with 10 being normal): 10 IMPRESSION: 1. No evidence of acute intracranial abnormality. 2. ASPECTS is 10. 3. Mild chronic small vessel ischemic disease including chronic lacunar infarcts as above. These results were called by telephone at the time of interpretation  on 08/30/2016 at 7:01 pm to Dr. Crista CurbANA LIU , who verbally acknowledged these results. Electronically Signed   By: Sebastian AcheAllen  Grady M.D.   On: 08/30/2016 19:02    Procedures Procedures (including critical care time) CRITICAL CARE Performed by: Enid SkeensZAVITZ, Gwendalyn Mcgonagle M   Total critical care time: 35 minutes  Critical care time was exclusive of separately billable procedures and treating other patients.  Critical care was necessary to treat or prevent imminent or life-threatening deterioration.  Critical care was time spent personally by me on the following activities: development of treatment plan with patient and/or surrogate as well as nursing, discussions with consultants, evaluation of patient's response to treatment, examination of patient, obtaining history from patient or surrogate, ordering and performing treatments and interventions, ordering and review of laboratory studies, ordering and review  of radiographic studies, pulse oximetry and re-evaluation of patient's condition.  Medications Ordered in ED Medications  alteplase (ACTIVASE) 1 mg/mL infusion 90 mg (not administered)    Followed by  0.9 %  sodium chloride infusion (not administered)  iopamidol (ISOVUE-370) 76 % injection (50 mLs Intravenous Contrast Given 08/30/16 2034)     Initial Impression / Assessment and Plan / ED Course  I have reviewed the triage vital signs and the nursing notes.  Pertinent labs & imaging results that were available during my care of the patient were reviewed by me and considered in my medical decision making (see chart for details).   Code stroke transfer.  TPA ongoing.  Discussed with neuro in ED, ICU admission, updated patient.  The patients results and plan were reviewed and discussed.   Any x-rays performed were independently reviewed by myself.   Differential diagnosis were considered with the presenting HPI.  Medications  alteplase (ACTIVASE) 1 mg/mL infusion 90 mg (not administered)    Followed by  0.9 %  sodium chloride infusion (not administered)  iopamidol (ISOVUE-370) 76 % injection (50 mLs Intravenous Contrast Given 08/30/16 2034)    Vitals:   08/30/16 1851 08/30/16 1945 08/30/16 2000 08/30/16 2032  BP:  152/88 126/88 146/90  Pulse:  93 88 86  Resp:  13 17 16   Temp: 98.2 F (36.8 C)     TempSrc:      SpO2:  98% 97% 99%  Weight:      Height:        Final diagnoses:  Acute ischemic stroke [facility](HCC)    Admission/ observation were discussed with the admitting physician, patient and/or family and they are comfortable with the plan.    Final Clinical Impressions(s) / ED Diagnoses   Final diagnoses:  Acute ischemic stroke [facility](HCC)    New Prescriptions New Prescriptions   No medications on file     Blane OharaJoshua Luca Burston, MD 08/30/16 2041

## 2016-08-31 ENCOUNTER — Ambulatory Visit (HOSPITAL_COMMUNITY): Payer: Federal, State, Local not specified - PPO

## 2016-08-31 ENCOUNTER — Inpatient Hospital Stay (HOSPITAL_COMMUNITY): Payer: Federal, State, Local not specified - PPO

## 2016-08-31 DIAGNOSIS — I6300 Cerebral infarction due to thrombosis of unspecified precerebral artery: Secondary | ICD-10-CM

## 2016-08-31 DIAGNOSIS — I6789 Other cerebrovascular disease: Secondary | ICD-10-CM

## 2016-08-31 LAB — LIPID PANEL
CHOL/HDL RATIO: 6 ratio
CHOLESTEROL: 179 mg/dL (ref 0–200)
HDL: 30 mg/dL — AB (ref >40)
LDL Cholesterol: 91 mg/dL (ref 0–99)
TRIGLYCERIDES: 291 mg/dL — AB (ref <150)
VLDL: 58 mg/dL — ABNORMAL HIGH (ref 0–40)

## 2016-08-31 LAB — ECHOCARDIOGRAM COMPLETE
HEIGHTINCHES: 67 in
Weight: 4400 oz

## 2016-08-31 LAB — MRSA PCR SCREENING: MRSA by PCR: NEGATIVE

## 2016-08-31 LAB — BASIC METABOLIC PANEL
ANION GAP: 10 (ref 5–15)
BUN: 8 mg/dL (ref 6–20)
CALCIUM: 9 mg/dL (ref 8.9–10.3)
CO2: 21 mmol/L — AB (ref 22–32)
Chloride: 108 mmol/L (ref 101–111)
Creatinine, Ser: 0.98 mg/dL (ref 0.61–1.24)
GFR calc non Af Amer: >60 mL/min (ref >60)
Glucose, Bld: 130 mg/dL — ABNORMAL HIGH (ref 65–99)
Potassium: 4.2 mmol/L (ref 3.5–5.1)
SODIUM: 139 mmol/L (ref 135–145)

## 2016-08-31 MED ORDER — GENTAMICIN SULFATE 0.1 % EX CREA
TOPICAL_CREAM | CUTANEOUS | Status: DC
  Administered 2016-09-01: 1 via TOPICAL
  Filled 2016-08-31: qty 15

## 2016-08-31 MED ORDER — ATORVASTATIN CALCIUM 10 MG PO TABS
20.0000 mg | ORAL_TABLET | Freq: Every day | ORAL | Status: DC
  Administered 2016-08-31 – 2016-09-01 (×2): 20 mg via ORAL
  Filled 2016-08-31: qty 2
  Filled 2016-08-31: qty 1

## 2016-08-31 MED ORDER — PERFLUTREN LIPID MICROSPHERE
1.0000 mL | INTRAVENOUS | Status: AC | PRN
  Filled 2016-08-31: qty 10

## 2016-08-31 MED ORDER — GENTAMICIN SULFATE 0.1 % EX CREA
TOPICAL_CREAM | Freq: Three times a day (TID) | CUTANEOUS | Status: DC
  Filled 2016-08-31: qty 15

## 2016-08-31 MED ORDER — PERFLUTREN LIPID MICROSPHERE
INTRAVENOUS | Status: AC
  Administered 2016-08-31: 2 mL
  Filled 2016-08-31: qty 10

## 2016-08-31 MED ORDER — PANTOPRAZOLE SODIUM 40 MG PO TBEC
40.0000 mg | DELAYED_RELEASE_TABLET | Freq: Every day | ORAL | Status: DC
  Administered 2016-08-31 – 2016-09-01 (×2): 40 mg via ORAL
  Filled 2016-08-31 (×2): qty 1

## 2016-08-31 MED ORDER — ENSURE ENLIVE PO LIQD
237.0000 mL | Freq: Two times a day (BID) | ORAL | Status: DC
  Administered 2016-08-31 – 2016-09-02 (×4): 237 mL via ORAL

## 2016-08-31 NOTE — Progress Notes (Signed)
Initial Nutrition Assessment  DOCUMENTATION CODES:   Morbid obesity  INTERVENTION:  Ensure Enlive po BID, each supplement provides 350 kcal and 20 grams of protein  NUTRITION DIAGNOSIS:   Inadequate oral intake related to  (decreased appetite) as evidenced by per patient/family report.  GOAL:   Patient will meet greater than or equal to 90% of their needs  MONITOR:   PO intake, Supplement acceptance  REASON FOR ASSESSMENT:   Malnutrition Screening Tool    ASSESSMENT:   71 yo M with likely small ischemic infarct. He has received IV tpa.  Per pt he lives at home alone and uses a walker to get around.  He has had foot surgery, right leg in boot.  He eats soup/sandwich, mostly convenience foods.  Reports decreased appetite and does not drink supplements at home.  He is unsure if he has lost any weight.  Nutrition-Focused physical exam completed. Findings are no fat depletion, no muscle depletion, and mild edema.   Diet Order:  Diet Carb Modified Fluid consistency: Thin; Room service appropriate? Yes  Skin:  Reviewed, no issues  Last BM:  1/22  Height:   Ht Readings from Last 1 Encounters:  08/30/16 5\' 7"  (1.702 m)    Weight:   Wt Readings from Last 1 Encounters:  08/30/16 275 lb (124.7 kg)    Ideal Body Weight:  67.2 kg  BMI:  Body mass index is 43.07 kg/m.  Estimated Nutritional Needs:   Kcal:  1800-2000  Protein:  95-110 grams  Fluid:  > 1.8 L/day  EDUCATION NEEDS:   No education needs identified at this time  Kendell BaneHeather Mialee Weyman RD, LDN, CNSC [phone] Pager [phone] After Hours Pager'

## 2016-08-31 NOTE — Evaluation (Signed)
Clinical/Bedside Swallow Evaluation Patient Details  Name: Aaron Moon MRN: [id number] Date of Birth: 02-28-1946  Today's Date: 08/31/2016 Time: SLP Start Time (ACUTE ONLY): 40980918 SLP Stop Time (ACUTE ONLY): 0928 SLP Time Calculation (min) (ACUTE ONLY): 10 min  Past Medical History:  Past Medical History:  Diagnosis Date  . Gout   . Hyperlipidemia   . Obesity    Past Surgical History:  Past Surgical History:  Procedure Laterality Date  . ABDOMINAL SURGERY     colostomy & colostomy reversal  . ANKLE SURGERY     HPI:  71 year old male who presents with right arm weakness, s/p tPA. History of obesity and HLD. CT negative for acute changes, MRI pending.   Assessment / Plan / Recommendation Clinical Impression  Pt has trace-mild amounts of right-sided pocketing iwth regular textures, but manages this with Min cues for a lignual sweep. Pt requests a liquid wash with Mod I. No overt s/s of aspiration were observed even when challenged with large, consecutive straw sips of thin liquids. Recommend to initiate regular textures and thin liquids. SLP to f/u briefly for tolerance.    Aspiration Risk  Mild aspiration risk    Diet Recommendation Regular;Thin liquid   Liquid Administration via: Cup;Straw Medication Administration: Whole meds with liquid Supervision: Patient able to self feed;Intermittent supervision to cue for compensatory strategies Compensations: Slow rate;Small sips/bites;Minimize environmental distractions;Lingual sweep for clearance of pocketing Postural Changes: Seated upright at 90 degrees;Remain upright for at least 30 minutes after po intake    Other  Recommendations Oral Care Recommendations: Oral care BID   Follow up Recommendations  (tba)      Frequency and Duration min 1 x/week  1 week       Prognosis        Swallow Study   General Date of Onset: 08/30/16 HPI: 71 year old male who presents with right arm weakness, s/p tPA. History of obesity and  HLD. CT negative for acute changes, MRI pending. Type of Study: Bedside Swallow Evaluation Previous Swallow Assessment: none in chart Diet Prior to this Study: NPO Temperature Spikes Noted: No Respiratory Status: Room air History of Recent Intubation: No Behavior/Cognition: Alert;Cooperative;Pleasant mood;Distractible;Requires cueing Oral Cavity Assessment: Within Functional Limits Oral Care Completed by SLP: No Oral Cavity - Dentition: Other (Comment) (partial dentures in place) Vision: Functional for self-feeding Self-Feeding Abilities: Able to feed self;Needs assist Patient Positioning: Upright in bed Baseline Vocal Quality: Normal Volitional Cough: Strong Volitional Swallow: Able to elicit    Oral/Motor/Sensory Function Overall Oral Motor/Sensory Function: Mild impairment Facial ROM: Reduced right;Suspected CN VII (facial) dysfunction Facial Symmetry: Abnormal symmetry right;Suspected CN VII (facial) dysfunction Facial Strength: Reduced right;Suspected CN VII (facial) dysfunction Lingual ROM: Within Functional Limits Lingual Symmetry: Within Functional Limits Lingual Strength: Within Functional Limits Mandible: Within Functional Limits   Ice Chips Ice chips: Within functional limits Presentation: Spoon   Thin Liquid Thin Liquid: Within functional limits Presentation: Cup;Self Fed;Straw    Nectar Thick Nectar Thick Liquid: Not tested   Honey Thick Honey Thick Liquid: Not tested   Puree Puree: Within functional limits Presentation: Self Fed;Spoon   Solid   GO   Solid: Impaired Presentation: Self Fed Oral Phase Functional Implications: Right lateral sulci pocketing        Maxcine Hamaiewonsky, Tenee Wish 08/31/2016,10:03 AM  Maxcine HamLaura Paiewonsky, M.A. CCC-SLP [phone]

## 2016-08-31 NOTE — Progress Notes (Signed)
  Echocardiogram 2D Echocardiogram has been performed.  Nolon RodBrown, Tony 08/31/2016, 11:52 AM

## 2016-08-31 NOTE — Progress Notes (Signed)
STROKE TEAM PROGRESS NOTE   HISTORY OF PRESENT ILLNESS (per record) Aaron Moon is a 71 y.o. male who was in his normal state of health until 5:30 pm tonight. He was watching TV when he had sudden onset right hand weakness. [facility] code stroke was called, and given his deficits tPA was administered prior to transfer. On arrival here, he has Arm >>> leg and face weakness.    LKW: 5:30 pm tpa given?: yes   SUBJECTIVE (INTERVAL HISTORY) The patient's nephew was at the bedside. The patient still has some right upper extremity weakness he is otherwise alert and appropriate. He recently had foot surgery. We will ask the wound care nurse to evaluate and follow.BP adequately controlled   OBJECTIVE Temp:  [98.1 F (36.7 C)-98.4 F (36.9 C)] 98.1 F (36.7 C) (01/22 0400) Pulse Rate:  [66-93] 68 (01/22 0800) Cardiac Rhythm: Normal sinus rhythm (01/21 2330) Resp:  [12-43] 15 (01/22 0800) BP: (109-180)/(59-123) 149/98 (01/22 0800) SpO2:  [90 %-100 %] 94 % (01/22 0800) Weight:  [124.7 kg (275 lb)] 124.7 kg (275 lb) (01/21 1845)  CBC:   Recent Labs Lab 08/30/16 1845  WBC 11.8*  NEUTROABS 7.1  HGB 13.8  HCT 41.8  MCV 85.5  PLT 257    Basic Metabolic Panel:   Recent Labs Lab 08/30/16 1845 08/31/16 0319  NA 136 139  K 5.3* 4.2  CL 106 108  CO2 21* 21*  GLUCOSE 122* 130*  BUN 12 8  CREATININE 1.07 0.98  CALCIUM 8.9 9.0    Lipid Panel:     Component Value Date/Time   CHOL 179 08/31/2016 0319   TRIG 291 (H) 08/31/2016 0319   HDL 30 (L) 08/31/2016 0319   CHOLHDL 6.0 08/31/2016 0319   VLDL 58 (H) 08/31/2016 0319   LDLCALC 91 08/31/2016 0319   HgbA1c:  Lab Results  Component Value Date   HGBA1C 7.2 07/22/2015   Urine Drug Screen: No results found for: LABOPIA, COCAINSCRNUR, LABBENZ, AMPHETMU, THCU, LABBARB    IMAGING  Ct Angio Head and Neck W Or Wo Contrast 08/30/2016 1. Mild atherosclerosis at the carotid bifurcations in the neck without  stenosis.  2. Moderate proximal left vertebral artery stenosis.  3. Mild intracranial atherosclerosis without major vessel occlusion or significant proximal stenosis.     Ct Head Code Stroke W/o Cm 08/30/2016 1. No evidence of acute intracranial abnormality.  2. ASPECTS is 10.  3. Mild chronic small vessel ischemic disease including chronic lacunar infarcts as above.     PHYSICAL EXAM Obese elderly male not in distress. . Afebrile. Head is nontraumatic. Neck is supple without bruit.    Cardiac exam no murmur or gallop. Lungs are clear to auscultation. Distal pulses are not felt due to boots on both feet from recent foot surgery. Neurological Exam :  Awake alert oriented 3. Mild dysarthria. No aphasia. Follows commands well. Extraocular moments are full range without nystagmus. Fundi were not visualized. Vision acuity and fields appear normal. Mild right lower facial asymmetry. Tongue midline. Right upper extremity weakness 3/5 strength with drift. Mild 4+/5 strength in the right lower extremity but exam limited due to wearing boot and recent surgery. Coordination impaired in the right upper extremity. Diminished touch pinprick sensation in the right upper extremity only and normal elsewhere. Deep tendon reflexes 1+ symmetric. Plantars cannot be tested as wearing boots on both feet. Gait was not tested.   ASSESSMENT/PLAN Aaron Moon is a 71 y.o. male  with history of hyperlipidemia, obesity, and gout presenting with right upper extremity weakness. He received IV t-PA Sunday 08/30/2016 at 1915.   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   Carotid Doppler - CTA neck  2D Echo - pending  LDL - 91  HgbA1c - pending  VTE prophylaxis - SCDs Diet NPO time specified  No antithrombotic prior to admission, now on No  antithrombotic secondary to t-PA therapy.  Patient counseled to be compliant with his antithrombotic medications  Ongoing aggressive stroke risk factor management  Therapy recommendations: - pending  Disposition: Pending  Hypertension  Stable  Permissive hypertension (OK if < 220/120) but gradually normalize in 5-7 days  Long-term BP goal normotensive  Hyperlipidemia  Home meds:  No lipid lowering medications prior to admission.  LDL 91, goal < 70  Add low-dose Lipitor 20 mg daily  Continue statin at discharge    Other Stroke Risk Factors  Advanced age  The patient quit smoking cigarettes.  ETOH use, advised to drink no more than 1 drink per day  Obesity, Body mass index is 43.07 kg/m., recommend weight loss, diet and exercise as appropriate    Other Active Problems  Mild leukocytosis 11.8 - afebrile  Recent foot surgery   PLAN  Further imaging 24 hours post TPA  Begin antiplatelet therapy if no hemorrhage noted.  Recent foot surgery - the wound care nurse will evaluate and follow.    Hospital day # 1  Delton See PA-C [facility] Pager [phone] 08/31/2016, 1:34 PM I have personally examined this patient, reviewed notes, independently viewed imaging studies, participated in medical decision making and plan of care.ROS completed by me personally and pertinent positives fully documented  I have made any additions or clarifications directly to the above note. Agree with note above. He presented with right-sided weakness and received IV tPA. Suspect left brain subcortical infarct. Recommend strict blood pressure control and neurological follow-up as per post TPA protocol. Check MRI later today. Wound nurse consult for his feet. This patient is critically ill and at significant risk of neurological worsening, death and care requires constant monitoring of vital signs, hemodynamics,respiratory and cardiac monitoring, extensive review of  multiple databases, frequent neurological assessment, discussion with family, other specialists and medical decision making of high complexity.I have made any additions or clarifications directly to the above note.This critical care time does not reflect procedure time, or teaching time or supervisory time of PA/NP/Med Resident etc but could involve care discussion time.  I spent 32 minutes of neurocritical care time  in the care of  this patient.      Delia Heady, MD Medical Director [facility] Pager: [phone] 08/31/2016 3:58 PM   To contact Stroke Continuity provider, please refer to [email or web address]. After hours, contact General Neurology

## 2016-08-31 NOTE — Progress Notes (Signed)
PT Cancellation Note  Patient Details Name: Aaron Moon MRN: [id number] DOB: June 30, 1946   Cancelled Treatment:    Reason Eval/Treat Not Completed: Patient not medically ready Pt on bedrest. Will await increase in activity orders prior to PT evaluation.   Blake DivineShauna A Javeion Cannedy 08/31/2016, 8:46 AM Mylo RedShauna Kemari Mares, PT, DPT [phone]

## 2016-08-31 NOTE — Consult Note (Addendum)
WOC Nurse wound consult note Reason for Consult: Consult requested to assess right foot wound.  Pt had surgery by Dr Logan BoresEvans of the podiatry service in December, and pt states he has ordered Gentamycin cream to be applied every other day. Wound type: Full thickness surgical sites to right toes; no open wounds, surgical pins in place over each toe.  No odor, drainage, or swelling. Dressing procedure/placement/frequency: Applied hydrogel and reapplied nonadherent dressing and kerlex.   Primary team: Please order Gentamycin cream to be applied to right foot surgical sites Q Tues/Thurs/Sat.This is what the podiatrist had previously ordered after surgery. Discussed plan of care with patient and family member at the bedside.  He can resume follow-up with podiatrist after discharge. Please re-consult if further assistance is needed.  Thank-you,  Cammie Mcgeeawn Negar Sieler MSN, RN, CWOCN, [place]AP, CNS [phone]

## 2016-08-31 NOTE — Progress Notes (Signed)
OT Cancellation Note  Patient Details Name: Doug SouDana Capek MRN: [id number] DOB: [birth year]   Cancelled Treatment:    Reason Eval/Treat Not Completed: Patient not medically ready.  Pt on strict bedrest.  Jeani HawkingWendi Gladie Gravette, OTR/L [phone]   Jeani HawkingConarpe, Khali Perella M 08/31/2016, 11:18 AM

## 2016-08-31 NOTE — Evaluation (Addendum)
Physical Therapy Evaluation Patient Details Name: Aaron Moon MRN: [id number] DOB: [birth year] Today's Date: 08/31/2016   History of Present Illness  Patient is a 71 y/o male with hx of obesity, HLD and gout presents with right sided weakness. Head CT-unremarkable. s/p tPA. MRI pending.  Clinical Impression  Patient presents with right sided weakness, Rt inattention, incoordination, poor attention to tasks, impaired memory, problem solving and decreased safety awareness. Pt had reconstructive surgery on right foot in December and per his report is PWB with the CAM boot donned so he has mainly been using knee walker for household ambulation and w/c for community ambulation. Reports falls. Pt requires cues to attend to RUE during tasks and keep on RW. Min A for transfers. Not safe to return home alone due to new deficits and safety concerns. Would benefit from CIR to maximize independence and mobility prior to return home. Will follow acutely.    Follow Up Recommendations CIR    Equipment Recommendations  Other (comment) (TBA)    Recommendations for Other Services Rehab consult     Precautions / Restrictions Precautions Precautions: Fall Required Braces or Orthoses: Other Brace/Splint Other Brace/Splint: CAM boot RLE; post op shoe on left Restrictions Weight Bearing Restrictions: Yes RLE Weight Bearing: Touchdown weight bearing Other Position/Activity Restrictions: Reports being able to use it for balance in standing but not to walk on it.      Mobility  Bed Mobility Overal bed mobility: Needs Assistance Bed Mobility: Supine to Sit     Supine to sit: Min guard;HOB elevated     General bed mobility comments: Increased effort needed and heavy use of rail. Cues for technique and to use RUE as pt trying to use core to get to EOB. no dizziness.  Transfers Overall transfer level: Needs assistance Equipment used: Rolling walker (2 wheeled) Transfers: Sit to/from [employer] CorporationStand;Stand Pivot  Transfers Sit to Stand: Min assist Stand pivot transfers: Min assist       General transfer comment: Assist to power to standing with cues for hand placement/technique. Pt placing full weight on RLE despite telling therapist he is not supposed too. SPT bed to chair with Min A for balance/RW.  Ambulation/Gait Ambulation/Gait assistance:  (Non ambulatory at this time due to recenr foot reconstruction RLE and PWB)              Stairs            Wheelchair Mobility    Modified Rankin (Stroke Patients Only)       Balance Overall balance assessment: Needs assistance Sitting-balance support: Feet supported;No upper extremity supported Sitting balance-Leahy Scale: Fair     Standing balance support: During functional activity;Bilateral upper extremity supported Standing balance-Leahy Scale: Poor Standing balance comment: Reliant on BUEs for support ins tanding. Right inattention noted RUE.                             Pertinent Vitals/Pain Pain Assessment: No/denies pain    Home Living Family/patient expects to be discharged to:: Private residence Living Arrangements: Alone Available Help at Discharge: Family;Available PRN/intermittently (nephew lives across the street) Type of Home: Other(Comment) (condo) Home Access: Level entry     Home Layout: One level Home Equipment: Walker - 2 wheels;Other (comment);Wheelchair - manual;Shower seat (knee walker)      Prior Function Level of Independence: Independent         Comments: Uses knee walker for household distances and w/c for community. Was  driving until foot surgery in December     Hand Dominance   Dominant Hand: Right    Extremity/Trunk Assessment   Upper Extremity Assessment Upper Extremity Assessment: RUE deficits/detail RUE Deficits / Details: Weakness and impaired coordination noted during functional tasks.  RUE Sensation: decreased light touch RUE Coordination: decreased fine  motor;decreased gross motor    Lower Extremity Assessment Lower Extremity Assessment: Generalized weakness RLE Deficits / Details: Grossly ~3+/5 throughout, ankle not assessed secondary to CAM boot and surgery. LLE Deficits / Details: Grossly ~3+/5 throughout, ankle not assessed secondary to post op shoe.       Communication   Communication: No difficulties  Cognition Arousal/Alertness: Awake/alert Behavior During Therapy: Impulsive Overall Cognitive Status: Impaired/Different from baseline Area of Impairment: Attention;Following commands;Problem solving   Current Attention Level: Sustained   Following Commands: Follows multi-step commands inconsistently       General Comments: Difficulty following directional commands.  Right inattention noted. Laughs inappropriately at questions asked. inconsisent answers provided when asked about WB status of foot (said NWB and then PWB) but wanting to walkt o bathroom.    General Comments      Exercises     Assessment/Plan    PT Assessment Patient needs continued PT services  PT Problem List Decreased strength;Decreased mobility;Decreased safety awareness;Impaired tone;Decreased coordination;Obesity;Decreased cognition;Decreased activity tolerance;Impaired sensation;Decreased balance          PT Treatment Interventions DME instruction;Therapeutic activities;Gait training;Therapeutic exercise;Patient/family education;Balance training;Functional mobility training    PT Goals (Current goals can be found in the Care Plan section)  Acute Rehab PT Goals Patient Stated Goal: be able to use my right arm PT Goal Formulation: With patient Time For Goal Achievement: 09/14/16 Potential to Achieve Goals: Fair    Frequency Min 3X/week   Barriers to discharge Decreased caregiver support lives alone    Co-evaluation               End of Session Equipment Utilized During Treatment: Gait belt Activity Tolerance: Patient tolerated  treatment well Patient left: in chair;with call bell/phone within reach;with chair alarm set Nurse Communication: Mobility status         Time: 1017-5102 PT Time Calculation (min) (ACUTE ONLY): 31 min   Charges:   PT Evaluation $PT Eval Moderate Complexity: 1 Procedure     PT G Codes:        Pasqualino Witherspoon A Harvel Meskill 08/31/2016, 4:55 PM Mylo Red, PT, DPT [phone]

## 2016-08-31 NOTE — Evaluation (Signed)
Occupational Therapy Evaluation Patient Details Name: Aaron Moon MRN: [id number] DOB: 10/08/1945 Today's Date: 08/31/2016    History of Present Illness Patient is a 71 y/o male with hx of obesity, HLD and gout presents with right sided weakness. Head CT-unremarkable. s/p tPA. MRI pending.   Clinical Impression   Pt admitted with above. He demonstrates the below listed deficits and will benefit from continued OT to maximize safety and independence with BADLs.  Pt presents to OT with cognitive deficits including impaired attention, safety/judgement, problem solving and sequencing deficits; Rt inattention, impaired balance.  He requires mod A for ADLs and min A for functional transfers.  He will require 24 hour assist at discharge.  Recommend CIR.      Follow Up Recommendations  CIR;Supervision/Assistance - 24 hour    Equipment Recommendations  3 in 1 bedside commode;Tub/shower seat    Recommendations for Other Services Rehab consult     Precautions / Restrictions Precautions Precautions: Fall Required Braces or Orthoses: Other Brace/Splint Other Brace/Splint: CAM boot RLE; post op shoe on left Restrictions Weight Bearing Restrictions: Yes RLE Weight Bearing: Touchdown weight bearing Other Position/Activity Restrictions: Reports being able to use it for balance in standing but not to walk on it.      Mobility Bed Mobility Overal bed mobility: Needs Assistance Bed Mobility: Supine to Sit     Supine to sit: Min guard;HOB elevated     General bed mobility comments: Pt sitting up in chair   Transfers Overall transfer level: Needs assistance Equipment used: Rolling walker (2 wheeled) Transfers: Sit to/from UGI CorporationStand;Stand Pivot Transfers Sit to Stand: Min assist Stand pivot transfers: Min assist       General transfer comment: Pt requires mod - max cues for proper hand placement and safety.  He requires step by step cues for technique as well as assist to move into  standing and for balance as well as to control Rt UE     Balance Overall balance assessment: Needs assistance Sitting-balance support: Feet supported;No upper extremity supported Sitting balance-Leahy Scale: Fair     Standing balance support: During functional activity;Bilateral upper extremity supported Standing balance-Leahy Scale: Poor Standing balance comment: requires min A                             ADL Overall ADL's : Needs assistance/impaired Eating/Feeding: Set up;Sitting Eating/Feeding Details (indicate cue type and reason): using Lt UE (he is right hand dominant) Grooming: Wash/dry hands;Wash/dry face;Oral care;Brushing hair;Set up;Sitting Grooming Details (indicate cue type and reason): using Lt UE  Upper Body Bathing: Moderate assistance;Sitting   Lower Body Bathing: Moderate assistance;Sit to/from stand   Upper Body Dressing : Moderate assistance;Sitting   Lower Body Dressing: Total assistance;Sit to/from stand   Toilet Transfer: Minimal assistance;Stand-pivot;BSC;RW Toilet Transfer Details (indicate cue type and reason): Pt requires step by step cues for hand placement and sequencing.  He fails to place Rt UE on walker, then attempts to proceed one handedly moving RW  Toileting- ArchitectClothing Manipulation and Hygiene: Moderate assistance;Sit to/from stand       Functional mobility during ADLs: Minimal assistance;Rolling walker General ADL Comments: Pt unable to maintain WBing status      Vision Vision Assessment?: Yes Eye Alignment: Within Functional Limits Ocular Range of Motion: Within Functional Limits Alignment/Gaze Preference: Within Defined Limits Tracking/Visual Pursuits: Able to track stimulus in all quads without difficulty Visual Fields: No apparent deficits Additional Comments: Pt able to read clock  in room without difficulty.  Would benefit from further visual assessment    Perception Perception Perception Tested?: Yes Perception  Deficits: Inattention/neglect Inattention/Neglect: Does not attend to right side of body Spatial deficits: requires mod cues to attend to Rt UE    Praxis Praxis Praxis tested?: Within functional limits    Pertinent Vitals/Pain Pain Assessment: No/denies pain     Hand Dominance Right   Extremity/Trunk Assessment Upper Extremity Assessment Upper Extremity Assessment: RUE deficits/detail RUE Deficits / Details: Pt demonstrates shoulder flexion ~110*.  elbow flex/ext [facility] actively.  Mass grasp/release.  He can oppose digit 3.  He demonstrates Rt inattention.  Moveement consistent with Brunnstrom stage V.  He can retrieve items at 100* shoulder elevation with good alignment of shoulder, but demonstrates decreased eccentric control of shoulder and elbow  RUE Sensation: decreased light touch;decreased proprioception RUE Coordination: decreased fine motor;decreased gross motor   Lower Extremity Assessment Lower Extremity Assessment: Defer to PT evaluation RLE Deficits / Details: Grossly ~3+/5 throughout, ankle not assessed secondary to CAM boot and surgery. LLE Deficits / Details: Grossly ~3+/5 throughout, ankle not assessed secondary to post op shoe.       Communication Communication Communication: No difficulties   Cognition Arousal/Alertness: Awake/alert Behavior During Therapy: Impulsive Overall Cognitive Status: Impaired/Different from baseline Area of Impairment: Attention;Following commands;Safety/judgement;Awareness;Problem solving   Current Attention Level: Sustained   Following Commands: Follows multi-step commands inconsistently Safety/Judgement: Decreased awareness of safety;Decreased awareness of deficits Awareness: Intellectual Problem Solving: Difficulty sequencing;Requires verbal cues;Requires tactile cues General Comments: Pt with Rt inattention.  He is unable to follow multi step instruction with distraction.  He demonstrates poor safety awareness, and poor  judgement.  pt with inappropriate affect to situation    General Comments       Exercises       Shoulder Instructions      Home Living Family/patient expects to be discharged to:: Private residence Living Arrangements: Alone Available Help at Discharge: Family;Available PRN/intermittently (nephew lives across the street) Type of Home: Other(Comment) (condo) Home Access: Level entry     Home Layout: One level     Bathroom Shower/Tub: Producer, television/film/video: Standard     Home Equipment: Environmental consultant - 2 wheels;Other (comment);Wheelchair - manual;Shower seat (knee walker)      Lives With: Alone    Prior Functioning/Environment Level of Independence: Independent        Comments: Uses knee walker for household distances and w/c for community. Was driving until foot surgery in December        OT Problem List: Decreased strength;Decreased activity tolerance;Impaired balance (sitting and/or standing);Decreased range of motion;Impaired vision/perception;Decreased coordination;Decreased cognition;Decreased safety awareness;Decreased knowledge of use of DME or AE;Impaired sensation;Obesity;Impaired UE functional use   OT Treatment/Interventions: Self-care/ADL training;Neuromuscular education;DME and/or AE instruction;Therapeutic activities;Cognitive remediation/compensation;Visual/perceptual remediation/compensation;Patient/family education;Balance training    OT Goals(Current goals can be found in the care plan section) Acute Rehab OT Goals Patient Stated Goal: be able to use my right arm OT Goal Formulation: With patient Time For Goal Achievement: 09/07/16 Potential to Achieve Goals: Good ADL Goals Pt Will Perform Eating: with modified independence;sitting Pt Will Perform Grooming: with set-up;sitting Pt Will Perform Upper Body Bathing: with supervision;sitting Pt Will Perform Lower Body Bathing: with min guard assist;sit to/from stand Pt Will Perform Upper Body  Dressing: with set-up;sitting Pt Will Perform Lower Body Dressing: with min guard assist;sit to/from stand;with adaptive equipment Pt Will Transfer to Toilet: with min guard assist;stand pivot transfer;bedside commode Pt Will Perform Toileting -  Clothing Manipulation and hygiene: with min guard assist;sit to/from stand Additional ADL Goal #1: Pt will use Rt UE as an active assist consistently during ADLs  Additional ADL Goal #2: Pt will require no more than min cues for safety during ADLs   OT Frequency: Min 2X/week   Barriers to D/C: Decreased caregiver support          Co-evaluation              End of Session Equipment Utilized During Treatment: Rolling walker;Gait belt Nurse Communication: Mobility status  Activity Tolerance: Patient tolerated treatment well Patient left: in chair;with call bell/phone within reach;with chair alarm set;with family/visitor present   Time: 1610-9604 OT Time Calculation (min): 15 min Charges:  OT General Charges $OT Visit: 1 Procedure OT Evaluation $OT Eval Moderate Complexity: 1 Procedure G-Codes:    Leesa Leifheit M 09/10/2016, 6:15 PM

## 2016-08-31 NOTE — Evaluation (Signed)
Speech Language Pathology Evaluation Patient Details Name: Aaron Moon MRN: [id number] DOB: March 23, 1946 Today's Date: 08/31/2016 Time: [id number] SLP Time Calculation (min) (ACUTE ONLY): 24 min  Problem List:  Patient Active Problem List   Diagnosis Date Noted  . CVA (cerebral vascular accident) (HCC) 08/30/2016  . Stroke (cerebrum) (HCC) 08/30/2016  . 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 Hammertoe of right foot 07/20/2016  . Hallux valgus, acquired, bilateral 02/19/2014  . Gout 02/13/2013  . Hyperlipidemia 01/18/2012  . Obesity 01/18/2012   Past Medical History:  Past Medical History:  Diagnosis Date  . Gout   . Hyperlipidemia   . Obesity    Past Surgical History:  Past Surgical History:  Procedure Laterality Date  . ABDOMINAL SURGERY     colostomy & colostomy reversal  . ANKLE SURGERY     HPI:  71 year old male who presents with right arm weakness, s/p tPA. History of obesity and HLD. CT negative for acute changes, MRI pending.   Assessment / Plan / Recommendation Clinical Impression  Pt has impaired selective attention and retrieval of new information, asking repetitive questions throughout SLP evaluation. He has limited awareness of his physical and cognitive deficits, and even tries to redirect or make excuses for why he is having difficulties. Min-Mod cues were provided for following one-step commands. While he has mild right-sided weakness, his speech is overall intelligible in conversation. Pt will need SLP f/u to maximize functional independence.     SLP Assessment  Patient needs continued Speech Lanaguage Pathology Services    Follow Up Recommendations   (tba)    Frequency and Duration min 2x/week  2 weeks      SLP Evaluation Cognition  Overall Cognitive Status: Impaired/Different from baseline Arousal/Alertness: Awake/alert Orientation Level: Oriented X4 Attention: Selective Selective Attention: Impaired Selective Attention  Impairment: Verbal basic Memory: Impaired Memory Impairment: Decreased recall of new information;Retrieval deficit Awareness: Impaired Awareness Impairment: Intellectual impairment;Emergent impairment;Anticipatory impairment Problem Solving: Impaired Problem Solving Impairment: Verbal basic;Functional basic Safety/Judgment: Impaired       Comprehension  Auditory Comprehension Overall Auditory Comprehension: Impaired Commands: Impaired One Step Basic Commands: 50-74% accurate Conversation: Simple Interfering Components: Attention;Processing speed;Working memory EffectiveTechniques: Extra processing time;Repetition;Slowed speech    Expression Expression Primary Mode of Expression: Verbal Verbal Expression Overall Verbal Expression: Impaired Initiation: No impairment Level of Generative/Spontaneous Verbalization: Conversation Naming:  (occasional word finding errors)   Oral / Motor  Oral Motor/Sensory Function Overall Oral Motor/Sensory Function: Mild impairment Facial ROM: Reduced right;Suspected CN VII (facial) dysfunction Facial Symmetry: Abnormal symmetry right;Suspected CN VII (facial) dysfunction Facial Strength: Reduced right;Suspected CN VII (facial) dysfunction Lingual ROM: Within Functional Limits Lingual Symmetry: Within Functional Limits Lingual Strength: Within Functional Limits Mandible: Within Functional Limits Motor Speech Overall Motor Speech: Impaired Respiration: Within functional limits Phonation: Normal Resonance: Within functional limits Articulation: Impaired Level of Impairment: Conversation Intelligibility: Intelligible Motor Planning: Witnin functional limits Motor Speech Errors: Not applicable   GO                    Aaron Moon, Aaron Moon 08/31/2016, 10:11 AM  Aaron Moon, M.A. CCC-SLP [phone]

## 2016-08-31 NOTE — Progress Notes (Addendum)
Patient transferred to [id number]. Vitals stable. Oriented to unit.  Questions answered. Tele applied and verified. Continue to monitor.

## 2016-09-01 ENCOUNTER — Encounter (HOSPITAL_COMMUNITY): Payer: Self-pay | Admitting: Physical Medicine and Rehabilitation

## 2016-09-01 DIAGNOSIS — I10 Essential (primary) hypertension: Secondary | ICD-10-CM

## 2016-09-01 DIAGNOSIS — E1159 Type 2 diabetes mellitus with other circulatory complications: Secondary | ICD-10-CM

## 2016-09-01 DIAGNOSIS — I639 Cerebral infarction, unspecified: Secondary | ICD-10-CM

## 2016-09-01 DIAGNOSIS — E119 Type 2 diabetes mellitus without complications: Secondary | ICD-10-CM

## 2016-09-01 DIAGNOSIS — I63413 Cerebral infarction due to embolism of bilateral middle cerebral arteries: Secondary | ICD-10-CM

## 2016-09-01 DIAGNOSIS — I519 Heart disease, unspecified: Secondary | ICD-10-CM

## 2016-09-01 DIAGNOSIS — I5189 Other ill-defined heart diseases: Secondary | ICD-10-CM

## 2016-09-01 DIAGNOSIS — D72829 Elevated white blood cell count, unspecified: Secondary | ICD-10-CM

## 2016-09-01 DIAGNOSIS — M1000 Idiopathic gout, unspecified site: Secondary | ICD-10-CM

## 2016-09-01 LAB — GLUCOSE, CAPILLARY
GLUCOSE-CAPILLARY: 177 mg/dL — AB (ref 65–99)
Glucose-Capillary: 166 mg/dL — ABNORMAL HIGH (ref 65–99)

## 2016-09-01 LAB — HEMOGLOBIN A1C
HEMOGLOBIN A1C: 7 % — AB (ref 4.8–5.6)
Mean Plasma Glucose: 154 mg/dL

## 2016-09-01 MED ORDER — INSULIN ASPART 100 UNIT/ML ~~LOC~~ SOLN
0.0000 [IU] | Freq: Three times a day (TID) | SUBCUTANEOUS | Status: DC
  Administered 2016-09-01: 2 [IU] via SUBCUTANEOUS
  Administered 2016-09-02: 1 [IU] via SUBCUTANEOUS

## 2016-09-01 MED ORDER — ALBUTEROL SULFATE (2.5 MG/3ML) 0.083% IN NEBU: 3.0000 mL | INHALATION_SOLUTION | Freq: Four times a day (QID) | RESPIRATORY_TRACT | Status: DC | PRN

## 2016-09-01 MED ORDER — TRAZODONE HCL 100 MG PO TABS
100.0000 mg | ORAL_TABLET | Freq: Every day | ORAL | Status: DC
  Administered 2016-09-01 (×2): 100 mg via ORAL
  Filled 2016-09-01 (×2): qty 1

## 2016-09-01 MED ORDER — ASPIRIN EC 325 MG PO TBEC
325.0000 mg | DELAYED_RELEASE_TABLET | Freq: Every day | ORAL | Status: DC
  Administered 2016-09-01: 325 mg via ORAL
  Filled 2016-09-01: qty 1

## 2016-09-01 NOTE — Progress Notes (Signed)
Rehab Admissions Coordinator Note:  Patient was screened by Trish MageLogue, Neyda Durango M for appropriateness for an Inpatient Acute Rehab Consult.  At this time, we are recommending Inpatient Rehab consult.  Trish MageLogue, Rechel Delosreyes M 09/01/2016, 8:35 AM  I can be reached at [phone].

## 2016-09-01 NOTE — Progress Notes (Signed)
STROKE TEAM PROGRESS NOTE   HISTORY OF PRESENT ILLNESS (per record) Aaron Moon is a 71 y.o. male who was in his normal state of health until 5:30 pm tonight. He was watching TV when he had sudden onset right hand weakness. [facility] ED where a code stroke was called, and given his deficits tPA was administered prior to transfer. On arrival here, he has Arm >>> leg and face weakness.    LKW: 5:30 pm tpa given?: yes   SUBJECTIVE (INTERVAL HISTORY) .He remains neurologically stable . The patient still has some right upper extremity weakness he is otherwise alert and appropriate.  .BP adequately controlled. MRI scan shows patchy left MCA infarct with some petechial hemorrhage as well as small right MCA nonhemorrhagic infarct etiology likely central cardiac source of embolism. He denies any prior history of atrial fibrillation,, palpitations or syncope   OBJECTIVE Temp:  [97.6 F (36.4 C)-98.6 F (37 C)] 98.6 F (37 C) (01/23 1343) Pulse Rate:  [70-86] 77 (01/23 1343) Cardiac Rhythm: Normal sinus rhythm (01/23 0700) Resp:  [16-22] 20 (01/23 1343) BP: (131-169)/(72-112) 150/77 (01/23 1343) SpO2:  [94 %-99 %] 95 % (01/23 1343) Weight:  [259 lb 4.2 oz (117.6 kg)] 259 lb 4.2 oz (117.6 kg) (01/22 2104)  CBC:   Recent Labs Lab 08/30/16 1845  WBC 11.8*  NEUTROABS 7.1  HGB 13.8  HCT 41.8  MCV 85.5  PLT 257    Basic Metabolic Panel:   Recent Labs Lab 08/30/16 1845 08/31/16 0319  NA 136 139  K 5.3* 4.2  CL 106 108  CO2 21* 21*  GLUCOSE 122* 130*  BUN 12 8  CREATININE 1.07 0.98  CALCIUM 8.9 9.0    Lipid Panel:     Component Value Date/Time   CHOL 179 08/31/2016 0319   TRIG 291 (H) 08/31/2016 0319   HDL 30 (L) 08/31/2016 0319   CHOLHDL 6.0 08/31/2016 0319   VLDL 58 (H) 08/31/2016 0319   LDLCALC 91 08/31/2016 0319   HgbA1c:  Lab Results  Component Value Date   HGBA1C 7.0 (H) 08/31/2016   Urine Drug Screen: No results found for: LABOPIA, COCAINSCRNUR,  LABBENZ, AMPHETMU, THCU, LABBARB    IMAGING  Ct Angio Head and Neck W Or Wo Contrast 08/30/2016 1. Mild atherosclerosis at the carotid bifurcations in the neck without stenosis.  2. Moderate proximal left vertebral artery stenosis.  3. Mild intracranial atherosclerosis without major vessel occlusion or significant proximal stenosis.     Ct Head Code Stroke W/o Cm 08/30/2016 1. No evidence of acute intracranial abnormality.  2. ASPECTS is 10.  3. Mild chronic small vessel ischemic disease including chronic lacunar infarcts as above.   MRI brain 08/31/2016 : Acute patchy small to moderate LEFT MCA territory infarct with petechial hemorrhage and small RIGHT nonhemorrhagic MCA territory infarct. Findings concerning for central embolic etiology. Mild chronic small vessel ischemic disease and old thalamus Infarcts. CT head 08/31/2016 ; Similar size and distribution of acute patchy small to moderate bilateral MCA territory infarcts, left greater than right. No evidence for hemorrhagic transformation or other complication status post tPA  PHYSICAL EXAM Obese elderly male not in distress. . Afebrile. Head is nontraumatic. Neck is supple without bruit.    Cardiac exam no murmur or gallop. Lungs are clear to auscultation. Distal pulses are not felt due to boots on both feet from recent foot surgery. Neurological Exam :  Awake alert oriented 3. Mild dysarthria. No aphasia. Follows commands well. Extraocular moments are  full range without nystagmus. Fundi were not visualized. Vision acuity and fields appear normal. Mild right lower facial asymmetry. Tongue midline. Right upper extremity weakness 3/5 strength with drift. Mild 4+/5 strength in the right lower extremity but exam limited due to wearing boot and recent surgery. Coordination impaired in the right upper extremity. Diminished touch pinprick sensation in the right upper extremity only and normal elsewhere. Deep tendon reflexes 1+ symmetric.  Plantars cannot be tested as wearing boots on both feet. Gait was not tested.   ASSESSMENT/PLAN Mr. Aaron Moon is a 71 y.o. male with history of hyperlipidemia, obesity, and gout presenting with right upper extremity weakness. He received IV t-PA Sunday 08/30/2016 at 1915.   Stroke:  Dominant infarct secondary to  Central cardiac source of emboli likely  Resultant  RUE paresis and incoordination MRI - Acute patchy small to moderate LEFT MCA territory infarct with petechial hemorrhage and small RIGHT nonhemorrhagic MCA territory  infarct. MRA - not performed  CTA H&N -  Moderate proximal left vertebral artery stenosis.   Carotid Doppler - CTA neck 2D Echo - Left ventricle: The cavity size was normal. There was mild   concentric hypertrophy. Systolic function was normal. Wall motion    was normal; there were no regional wall motion abnormalities  LDL - 91  HgbA1c - 7.0  VTE prophylaxis - SCDs Diet Carb Modified Fluid consistency: Thin; Room service appropriate? Yes  No antithrombotic prior to admission, now on No antithrombotic secondary to t-PA therapy.  Patient counseled to be compliant with his antithrombotic medications  Ongoing aggressive stroke risk factor management  Therapy recommendations: - CLR  Disposition: CLR  Hypertension  Stable  Permissive hypertension (OK if < 220/120) but gradually normalize in 5-7 days  Long-term BP goal normotensive  Hyperlipidemia  Home meds:  No lipid lowering medications prior to admission.  LDL 91, goal < 70  Add low-dose Lipitor 20 mg daily  Continue statin at discharge    Other Stroke Risk Factors  Advanced age  The patient quit smoking cigarettes.  ETOH use, advised to drink no more than 1 drink per day  Obesity, Body mass index is 40.61 kg/m., recommend weight loss, diet and exercise as appropriate    Other Active Problems  Mild leukocytosis 11.8 - afebrile  Recent foot surgery   PLAN  Mobilize  out of bed. Physical occupational therapy and rehabilitation consults.  Aspirin for stroke prevention for now but check transesophageal echocardiogram and may need loop recorder monitor  Recent foot surgery - the wound care nurse has evaluated and will follow.    Hospital day # 2     I have personally examined this patient, reviewed notes, independently viewed imaging studies, participated in medical decision making and plan of care.ROS completed by me personally and pertinent positives fully documented  I have made any additions or clarifications directly to the above note.   Plan continue mobilization out of bed and therapy. Transfer to rehabilitation over the next few days when bed available.Aspirin for stroke prevention for now but check transesophageal echocardiogram and may need loop recorder monitor. Greater than 50% time during this 25 minute visit was spent on counseling and coordination of care about stroke risk, evaluation plan and treatment and answering questions      Delia Heady, MD Medical Director [facility] Pager: [phone] 09/01/2016 3:30 PM   To contact Stroke Continuity provider, please refer to [email or web address]. After hours, contact General Neurology

## 2016-09-01 NOTE — Clinical Social Work Note (Signed)
Clinical Social Work Assessment  Patient Details  Name: Aaron Moon MRN: [id number] Date of Birth: 1946/06/02  Date of referral:  09/01/16               Reason for consult:  Facility Placement, Discharge Planning                Permission sought to share information with:  Facility Sport and exercise psychologist, Family Supports Permission granted to share information::  Yes, Verbal Permission Granted  Name::     Lawerance Bach  Agency::  SNF's  Relationship::  Sister  Contact Information:  [phone]  Housing/Transportation Living arrangements for the past 2 months:   Theme park manager) Source of Information:  Patient, Medical Team Patient Interpreter Needed:  None Criminal Activity/Legal Involvement Pertinent to Current Situation/Hospitalization:  No - Comment as needed Significant Relationships:  Siblings, Other Family Members Lives with:  Self Do you feel safe going back to the place where you live?  Yes Need for family participation in patient care:  Yes (Comment)  Care giving concerns:  PT recommending CIR. CSW initiating SNF backup plan.   Social Worker assessment / plan:  CSW met with patient. No supports at bedside. CSW introduced role and explained that PT recommendations would be discussed. Patient agreeable to SNF placement as a backup plan to CIR in the event he is not able to go there once medically stable. CSW offered SNF list but patient declined. CSW later realized that patient has [employer] and therefore has no SNF benefits. CSW will reach out to [place] to see if patient is service connected. No further concerns. CSW encouraged patient to contact CSW as needed. CSW will continue to follow patient for support and facilitate discharge to SNF, if able, once medically stable.  Employment status:  Retired Forensic scientist:  Other (Comment Required) Nurse, mental health) PT Recommendations:  Inpatient Rehab Consult Information / Referral to community resources:  [place]  Patient/Family's Response to care:  Patient agreeable to SNF placement but CSW has realized that patient does not have SNF benefits with his insurance. Patient's sister and nephew supportive and involved in patient's care. Patient appreciated social work intervention.  Patient/Family's Understanding of and Emotional Response to Diagnosis, Current Treatment, and Prognosis:  Patient has a good understanding of reason for hospitalization. Patient appears happy with hospital care.  Emotional Assessment Appearance:  Appears stated age Attitude/Demeanor/Rapport:  Other (Pleasant) Affect (typically observed):  Accepting, Appropriate, Calm, Pleasant Orientation:  Oriented to Self, Oriented to Place, Oriented to  Time, Oriented to Situation Alcohol / Substance use:  Never Used Psych involvement (Current and /or in the community):  No (Comment)  Discharge Needs  Concerns to be addressed:  Care Coordination Readmission within the last 30 days:  No Current discharge risk:  Dependent with Mobility, Lives alone Barriers to Discharge:  Inadequate or no insurance, Continued Medical Work up   Candie Chroman, LCSW 09/01/2016, 1:48 PM

## 2016-09-01 NOTE — Consult Note (Signed)
Physical Medicine and Rehabilitation Consult  Reason for Consult: RUE > RLE  weakness, right facial weakness Referring Physician: Dr. Pearlean Brownie.    HPI: Aaron Moon is a 71 y.o. R-handed male with history of gout, obesity, right foot surgery 07/2016--PWB who was admitted on 08/30/16 with sudden onset of right arm weakness and tPA administered at [facility] prior to transfer to [facility].  History taken from chart review and patient. On admission, patient with UE >LE weakness as well as facial weakness. CTA head/neck with moderate proximal L-VA stenosis and mild intracranial atherosclerosis.  MRI brain reviewed, showing patchy LEFT MCA infarct with petechial hemorrhage and small RIGHT nonhemorrhagic MCA territory infarct concerning for central embolic etiology. CT 1/22, showing b/l L>R infarcts. 2D echo with normal systolic function, no wall abnormalities and grade 1 diastolic dysfunction. Therapy evaluations done revealing impaired selective attention, limited awareness of deficits and inability to maintain PWB on RLE. CIR recommended for follow up therapy.   Was using a knee walker at home. Has food delivered and hired help with housework once a week. Independent and working prior to surgery 05/2016. Has a nephew in town.   Review of Systems  HENT: Negative for hearing loss and tinnitus.   Eyes: Negative for blurred vision and photophobia.  Respiratory: Negative for cough and shortness of breath.   Cardiovascular: Negative for chest pain and palpitations.  Gastrointestinal: Positive for heartburn. Negative for constipation and nausea.  Genitourinary: Negative for dysuria and urgency.       Nocturia X 2  Musculoskeletal: Negative for back pain and myalgias.  Skin: Negative for itching and rash.  Neurological: Positive for focal weakness. Negative for dizziness and headaches.  Psychiatric/Behavioral: Memory loss:   The patient has insomnia.   All other systems reviewed and are negative.     Past  Medical History:  Diagnosis Date  . Gout   . Hyperlipidemia   . Obesity     Past Surgical History:  Procedure Laterality Date  . ABDOMINAL SURGERY     colostomy & colostomy reversal  . ANKLE SURGERY      Family History  Problem Relation Age of Onset  . ALS Mother   . Pancreatic cancer Father       Social History:  Single.  Has been out of work since surgery 05/2016. He reports that he has quit smoking 8 months.  He has never used smokeless tobacco. He reports that he drinks alcohol. He reports that he does not use drugs.    Allergies: No Known Allergies    Medications Prior to Admission  Medication Sig Dispense Refill  . gentamicin cream (GARAMYCIN) 0.1 % Apply 1 application topically 3 (three) times daily. (Patient taking differently: Apply 1 application topically daily as needed (wound care/dressing change). ) 30 g 1  . meloxicam (MOBIC) 15 MG tablet Take 15 mg by mouth daily.  1  . omeprazole (PRILOSEC) 20 MG capsule Take 20 mg by mouth daily as needed (acid reflux).     Marland Kitchen oxyCODONE-acetaminophen (ROXICET) 5-325 MG tablet Take 1 tablet by mouth every 8 (eight) hours as needed for severe pain. 30 tablet 0  . traZODone (DESYREL) 100 MG tablet Take 100 mg by mouth at bedtime.    Marland Kitchen albuterol (PROVENTIL HFA;VENTOLIN HFA) 108 (90 BASE) MCG/ACT inhaler Inhale 2 puffs into the lungs every 6 (six) hours as needed for wheezing. (Patient not taking: Reported on 08/30/2016) 1 Inhaler 0  . ALPRAZolam (XANAX) 0.5 MG tablet Take 1  tablet (0.5 mg total) by mouth at bedtime as needed. (Patient not taking: Reported on 08/30/2016) 90 tablet 0  . oxyCODONE-acetaminophen (ROXICET) 5-325 MG tablet Take 1 tablet by mouth every 8 (eight) hours as needed for severe pain. (Patient not taking: Reported on 08/30/2016) 30 tablet 0    Home: Home Living Family/patient expects to be discharged to:: Private residence Living Arrangements: Alone Available Help at Discharge: Family, Available  PRN/intermittently (nephew lives across the street) Type of Home: Other(Comment) (condo) Home Access: Level entry Home Layout: One level Bathroom Shower/Tub: Walk-in Stage manager: Standard Home Equipment: Environmental consultant - 2 wheels, Other (comment), Wheelchair - manual, Shower seat (knee walker)  Lives With: Alone  Functional History: Prior Function Level of Independence: Independent Comments: Uses knee walker for household distances and w/c for community. Was driving until foot surgery in December Functional Status:  Mobility: Bed Mobility Overal bed mobility: Needs Assistance Bed Mobility: Supine to Sit Supine to sit: Min guard, HOB elevated General bed mobility comments: Pt sitting up in chair  Transfers Overall transfer level: Needs assistance Equipment used: Rolling walker (2 wheeled) Transfers: Sit to/from Stand, [employer] Transfers Sit to Stand: Min assist Stand pivot transfers: Min assist General transfer comment: Pt requires mod - max cues for proper hand placement and safety.  He requires step by step cues for technique as well as assist to move into standing and for balance as well as to control Rt UE  Ambulation/Gait Ambulation/Gait assistance:  (Non ambulatory at this time due to recenr foot reconstruction RLE and PWB)    ADL: ADL Overall ADL's : Needs assistance/impaired Eating/Feeding: Set up, Sitting Eating/Feeding Details (indicate cue type and reason): using Lt UE (he is right hand dominant) Grooming: Wash/dry hands, Wash/dry face, Oral care, Brushing hair, Set up, Sitting Grooming Details (indicate cue type and reason): using Lt UE  Upper Body Bathing: Moderate assistance, Sitting Lower Body Bathing: Moderate assistance, Sit to/from stand Upper Body Dressing : Moderate assistance, Sitting Lower Body Dressing: Total assistance, Sit to/from stand Toilet Transfer: Minimal assistance, Stand-pivot, BSC, RW Toilet Transfer Details (indicate cue type and  reason): Pt requires step by step cues for hand placement and sequencing.  He fails to place Rt UE on walker, then attempts to proceed one handedly moving RW  Toileting- Architect and Hygiene: Moderate assistance, Sit to/from stand Functional mobility during ADLs: Minimal assistance, Rolling walker General ADL Comments: Pt unable to maintain WBing status   Cognition: Cognition Overall Cognitive Status: Impaired/Different from baseline Arousal/Alertness: Awake/alert Orientation Level: Oriented X4 Attention: Selective Selective Attention: Impaired Selective Attention Impairment: Verbal basic Memory: Impaired Memory Impairment: Decreased recall of new information, Retrieval deficit Awareness: Impaired Awareness Impairment: Intellectual impairment, Emergent impairment, Anticipatory impairment Problem Solving: Impaired Problem Solving Impairment: Verbal basic, Functional basic Safety/Judgment: Impaired Cognition Arousal/Alertness: Awake/alert Behavior During Therapy: Impulsive Overall Cognitive Status: Impaired/Different from baseline Area of Impairment: Attention, Following commands, Safety/judgement, Awareness, Problem solving Current Attention Level: Sustained Following Commands: Follows multi-step commands inconsistently Safety/Judgement: Decreased awareness of safety, Decreased awareness of deficits Awareness: Intellectual Problem Solving: Difficulty sequencing, Requires verbal cues, Requires tactile cues General Comments: Pt with Rt inattention.  He is unable to follow multi step instruction with distraction.  He demonstrates poor safety awareness, and poor judgement.  pt with inappropriate affect to situation   Blood pressure (!) 150/87, pulse 70, temperature 97.6 F (36.4 C), temperature source Oral, resp. rate 18, height 5\' 7"  (1.702 m), weight 117.6 kg (259 lb 4.2 oz), SpO2 96 %.  Physical Exam  Vitals reviewed. Constitutional: He is oriented to person, place, and  time. He appears well-developed and well-nourished. No distress.  Obese  HENT:  Head: Normocephalic and atraumatic.  Mouth/Throat: Oropharynx is clear and moist.  Eyes: Conjunctivae and EOM are normal. Pupils are equal, round, and reactive to light.  Neck: Normal range of motion. Neck supple.  Respiratory: Effort normal and breath sounds normal. No stridor. No respiratory distress. He has no wheezes.  GI: Soft. Bowel sounds are normal. He exhibits distension. There is no tenderness.  Musculoskeletal: He exhibits no edema or tenderness.  CAM boot on right foot.   Neurological: He is alert and oriented to person, place, and time.  Mild right facial weakness.  Speech clear.  Distracted and needed cues to attend to task.  Able to follow simple motor commands.  DTRs symmetric Motor: RUE: 4-/5 proximal to distal RLE: 4+/5 proximally, boot distally LUE: 4/5 proximal to distal LLE: 5/5 proximal to distal Right sided significant dysmetria and ataxia  Skin: Skin is warm and dry. He is not diaphoretic.  Psychiatric: He has a normal mood and affect. His behavior is normal.    No results found for this or any previous visit (from the past 24 hour(s)). Ct Angio Head W Or Wo Contrast  Result Date: 08/30/2016 CLINICAL DATA:  Right arm weakness.  Status post tPA. EXAM: CT ANGIOGRAPHY HEAD AND NECK TECHNIQUE: Multidetector CT imaging of the head and neck was performed using the standard protocol during bolus administration of intravenous contrast. Multiplanar CT image reconstructions and MIPs were obtained to evaluate the vascular anatomy. Carotid stenosis measurements (when applicable) are obtained utilizing NASCET criteria, using the distal internal carotid diameter as the denominator. CONTRAST:  50 mL Isovue 370 COMPARISON:  None. FINDINGS: CTA NECK FINDINGS Aortic arch: Normal variant aortic arch branching pattern with common origin of the brachiocephalic and left common carotid arteries. Mild aortic  arch atherosclerosis. Mild non stenotic plaque at the left subclavian origin. Right carotid system: Medial course of the common carotid artery. Small volume noncalcified plaque in the proximal ICA with moderate luminal irregularity but no significant stenosis. Left carotid system: Medial course of the common carotid artery. Relatively small volume mixed calcified and noncalcified plaque involving the carotid bifurcation without significant stenosis. Vertebral arteries: Patent and codominant. Moderate proximal left P1 stenosis. Skeleton: Advanced disc degeneration C4-5, C5-6, and C6-7. Slight anterolisthesis of C7 on T1 with underlying facet arthrosis. Other neck: No mass or lymph node enlargement. Upper chest: Clear lung apices. Review of the MIP images confirms the above findings CTA HEAD FINDINGS Anterior circulation: The internal carotid arteries are patent from skullbase to carotid termini. There is mild-to-moderate siphon atherosclerosis bilaterally without significant stenosis. An infundibulum is noted at the left posterior communicating origin. ACAs and MCAs are patent without evidence of major branch occlusion or significant proximal stenosis. No aneurysm. Posterior circulation: Intracranial vertebral arteries are widely patent to the basilar. Dominant left PICA and right AICA. SCA origins are patent. Basilar artery is widely patent. There are patent posterior communicating arteries bilaterally, right larger than left. The right P1 segment is hypoplastic. No significant PCA stenosis is seen. No aneurysm. Venous sinuses: Grossly patent within limitations of arterial phase dominant contrast timing. Anatomic variants: None of significance. Review of the MIP images confirms the above findings IMPRESSION: 1. Mild atherosclerosis at the carotid bifurcations in the neck without stenosis. 2. Moderate proximal left vertebral artery stenosis. 3. Mild intracranial atherosclerosis without major vessel occlusion or  significant proximal stenosis. Electronically Signed   By: Sebastian AcheAllen  Grady M.D.   On: 08/30/2016 21:18   Ct Head Wo Contrast  Result Date: 09/01/2016 CLINICAL DATA:  Follow-up exam status post tPA. EXAM: CT HEAD WITHOUT CONTRAST TECHNIQUE: Contiguous axial images were obtained from the base of the skull through the vertex without intravenous contrast. COMPARISON:  Prior CT 08/30/2016 and MRI from 08/31/2016. FINDINGS: Brain: Stable atrophy with mild chronic small vessel ischemic disease. Evolving patchy acute bilateral MCA territory infarcts again seen, left greater than right. Overall, distribution similar nerve previous MRI. No significant mass effect. Few scattered subtle minimally hyperdense areas within the area of infarction likely reflect the previously seen petechial hemorrhage. No evidence for frank hemorrhagic transformation or other complication. No other acute large vessel territory infarct identified. No mass lesion, midline shift or mass effect. No hydrocephalus. No extra-axial fluid collection. Vascular: Vascular calcifications noted within the carotid siphons. No hyperdense vessel. Skull: Scalp soft tissues demonstrate no acute abnormality. Calvarium intact. Sinuses/Orbits: Globes orbits within normal limits. Paranasal sinuses are clear. No mastoid effusion. IMPRESSION: 1. Similar size and distribution of acute patchy small to moderate bilateral MCA territory infarcts, left greater than right. No evidence for hemorrhagic transformation or other complication status post tPA. 2. No other new acute intracranial process. Electronically Signed   By: Rise MuBenjamin  McClintock M.D.   On: 09/01/2016 01:33   Ct Angio Neck W Or Wo Contrast  Result Date: 08/30/2016 CLINICAL DATA:  Right arm weakness.  Status post tPA. EXAM: CT ANGIOGRAPHY HEAD AND NECK TECHNIQUE: Multidetector CT imaging of the head and neck was performed using the standard protocol during bolus administration of intravenous contrast.  Multiplanar CT image reconstructions and MIPs were obtained to evaluate the vascular anatomy. Carotid stenosis measurements (when applicable) are obtained utilizing NASCET criteria, using the distal internal carotid diameter as the denominator. CONTRAST:  50 mL Isovue 370 COMPARISON:  None. FINDINGS: CTA NECK FINDINGS Aortic arch: Normal variant aortic arch branching pattern with common origin of the brachiocephalic and left common carotid arteries. Mild aortic arch atherosclerosis. Mild non stenotic plaque at the left subclavian origin. Right carotid system: Medial course of the common carotid artery. Small volume noncalcified plaque in the proximal ICA with moderate luminal irregularity but no significant stenosis. Left carotid system: Medial course of the common carotid artery. Relatively small volume mixed calcified and noncalcified plaque involving the carotid bifurcation without significant stenosis. Vertebral arteries: Patent and codominant. Moderate proximal left P1 stenosis. Skeleton: Advanced disc degeneration C4-5, C5-6, and C6-7. Slight anterolisthesis of C7 on T1 with underlying facet arthrosis. Other neck: No mass or lymph node enlargement. Upper chest: Clear lung apices. Review of the MIP images confirms the above findings CTA HEAD FINDINGS Anterior circulation: The internal carotid arteries are patent from skullbase to carotid termini. There is mild-to-moderate siphon atherosclerosis bilaterally without significant stenosis. An infundibulum is noted at the left posterior communicating origin. ACAs and MCAs are patent without evidence of major branch occlusion or significant proximal stenosis. No aneurysm. Posterior circulation: Intracranial vertebral arteries are widely patent to the basilar. Dominant left PICA and right AICA. SCA origins are patent. Basilar artery is widely patent. There are patent posterior communicating arteries bilaterally, right larger than left. The right P1 segment is  hypoplastic. No significant PCA stenosis is seen. No aneurysm. Venous sinuses: Grossly patent within limitations of arterial phase dominant contrast timing. Anatomic variants: None of significance. Review of the MIP images confirms the above findings IMPRESSION: 1. Mild atherosclerosis at  the carotid bifurcations in the neck without stenosis. 2. Moderate proximal left vertebral artery stenosis. 3. Mild intracranial atherosclerosis without major vessel occlusion or significant proximal stenosis. Electronically Signed   By: Sebastian Ache M.D.   On: 08/30/2016 21:18   Mr Brain Wo Contrast  Result Date: 08/31/2016 CLINICAL DATA:  Acute onset RIGHT hand weakness while watching television tonight. Status post tPA. History of hyperlipidemia. EXAM: MRI HEAD WITHOUT CONTRAST TECHNIQUE: Multiplanar, multiecho pulse sequences of the brain and surrounding structures were obtained without intravenous contrast. COMPARISON:  CTA HEAD and CTA HEAD and neck August 30, 2016 FINDINGS: BRAIN: Patchy reduced diffusion LEFT greater than RIGHT parietal lobes reduced diffusion. Scattered LEFT frontal and LEFT basal ganglia foci of reduced diffusion. Corresponding low ADC values and patchy susceptibility artifact LEFT parietal lobe. Chronic micro hemorrhages central pons and RIGHT thalamus associated with chronic hypertension/infarcts. Old bilateral thalamus lacunar infarcts. Scattered subcentimeter supratentorial white matter FLAIR T2 hyperintensities exclusive of the aforementioned abnormalities. No midline shift, mass effect or masses. Ventricles and sulci are normal for patient's age. No abnormal extra-axial fluid collections. VASCULAR: Normal major intracranial vascular flow voids present at skull base. SKULL AND UPPER CERVICAL SPINE: No abnormal sellar expansion. No suspicious calvarial bone marrow signal. Craniocervical junction maintained. SINUSES/ORBITS: The mastoid air-cells and included paranasal sinuses are well-aerated. The  included ocular globes and orbital contents are non-suspicious. OTHER: None. IMPRESSION: Acute patchy small to moderate LEFT MCA territory infarct with petechial hemorrhage and small RIGHT nonhemorrhagic MCA territory infarct. Findings concerning for central embolic etiology. 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 Electronically Signed   By: Awilda Metro M.D.   On: 08/31/2016 18:22   Ct Head Code Stroke W/o Cm  Result Date: 08/30/2016 CLINICAL DATA:  Code stroke.  Right upper extremity weakness. EXAM: CT HEAD WITHOUT CONTRAST TECHNIQUE: Contiguous axial images were obtained from the base of the skull through the vertex without intravenous contrast. COMPARISON:  None. FINDINGS: Brain: There is no evidence of acute cortical infarct, intracranial hemorrhage, mass, midline shift, or extra-axial fluid collection. Mild generalized cerebral atrophy is within normal limits for age. A small subcortical infarct in the left parietal lobe appears chronic, as does a lacunar infarct at/ just inferior to the left caudate head. Cerebral white matter hypodensities elsewhere are nonspecific but compatible with mild chronic small vessel ischemic disease. Vascular: Calcified atherosclerosis at the skullbase. No hyperdense vessel. Skull: No fracture or focal osseous lesion. Sinuses/Orbits: Visualized paranasal sinuses and mastoid air cells are clear. Orbits are unremarkable. Other: None. ASPECTS [facility] Stroke Program Early CT Score) - Ganglionic level infarction (caudate, lentiform nuclei, internal capsule, insula, M1-M3 cortex): 7 - Supraganglionic infarction (M4-M6 cortex): 3 Total score (0-10 with 10 being normal): 10 IMPRESSION: 1. No evidence of acute intracranial abnormality. 2. ASPECTS is 10. 3. Mild chronic small vessel ischemic disease including chronic lacunar infarcts as above. These results were called by telephone at the time of interpretation on 08/30/2016 at 7:01 pm to Dr. Crista Curb  , who verbally acknowledged these results. Electronically Signed   By: Sebastian Ache M.D.   On: 08/30/2016 19:02    Assessment/Plan: Diagnosis: B/l embolic MCA infarct Labs and images independently reviewed.  Records reviewed and summated above. Stroke: Continue secondary stroke prophylaxis and Risk Factor Modification listed below:   Antiplatelet therapy:   Blood Pressure Management:  Continue current medication with prn's with permisive HTN per primary team Statin Agent:   Diabetes management:   B/l hemiparesis:  Motor  recovery: Fluoxetine  1. Does the need for close, 24 hr/day medical supervision in concert with the patient's rehab needs make it unreasonable for this patient to be served in a less intensive setting? Yes  2. Co-Morbidities requiring supervision/potential complications: gout (monitor for flare, ensure pain does not limit therapies), morbid obesity (Body mass index is 40.61 kg/m., diet and exercise education, encourage weight loss to increase endurance and promote overall health), recent right foot surgery (cont boot and PWB), diastolic dysfunction (monitor for signs and symptoms of fluid overload), HTN (monitor and provide prns in accordance with increased physical exertion and pain), DM (Monitor in accordance with exercise and adjust meds as necessary), leukocytosis (cont to monitor for signs and symptoms of infection, further workup if indicated) 3. Due to safety, disease management and patient education, does the patient require 24 hr/day rehab nursing? Yes 4. Does the patient require coordinated care of a physician, rehab nurse, PT (1-2 hrs/day, 5 days/week) and OT (1-2 hrs/day, 5 days/week) to address physical and functional deficits in the context of the above medical diagnosis(es)? Yes Addressing deficits in the following areas: balance, endurance, locomotion, strength, transferring, bathing, dressing, toileting and psychosocial support 5. Can the patient actively  participate in an intensive therapy program of at least 3 hrs of therapy per day at least 5 days per week? Yes 6. The potential for patient to make measurable gains while on inpatient rehab is excellent 7. Anticipated functional outcomes upon discharge from inpatient rehab are modified independent  with PT, modified independent with OT, n/a with SLP. 8. Estimated rehab length of stay to reach the above functional goals is: 14-17 days. 9. Does the patient have adequate social supports and living environment to accommodate these discharge functional goals? Yes 10. Anticipated D/C setting: Home 11. Anticipated post D/C treatments: HH therapy and Home excercise program 12. Overall Rehab/Functional Prognosis: good  RECOMMENDATIONS: This patient's condition is appropriate for continued rehabilitative care in the following setting: CIR Patient has agreed to participate in recommended program. Yes Note that insurance prior authorization may be required for reimbursement for recommended care.  Comment: Rehab Admissions Coordinator to follow up.  Maryla Morrow, MD, [address], [place] 09/01/2016

## 2016-09-01 NOTE — [place] FL2 (Signed)
[place] MEDICAID FL2 LEVEL OF CARE SCREENING TOOL     IDENTIFICATION  Patient Name: Aaron Moon Birthdate: November 01, 1945 Sex: male Admission Date (Current Location): 08/30/2016  [facility] [place]   Facility and Address:  [facility] [place], [address], [place], [place] [address]      Provider Number: [id number]  Attending Physician Name and Address:  Micki RileyPramod S Sheng Pritz, MD  Relative Name and Phone Number:       Current Level of Care: Hospital Recommended Level of Care: Skilled Nursing Facility Prior Approval Number:    Date Approved/Denied:   PASRR Number: [id number]  Discharge Plan: SNF    Current Diagnoses: Patient Active Problem List   Diagnosis Date Noted  . Acute ischemic stroke (HCC)   . Idiopathic gout   . Morbid obesity (HCC)   . Diastolic dysfunction   . Benign essential HTN   . Diabetes mellitus (HCC)   . Leukocytosis   . CVA (cerebral vascular accident) (HCC) 08/30/2016  . Stroke (cerebrum) (HCC) 08/30/2016  . 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 Hammertoe of right foot 07/20/2016  . Hallux valgus, acquired, bilateral 02/19/2014  . Gout 02/13/2013  . Hyperlipidemia 01/18/2012  . Obesity 01/18/2012    Orientation RESPIRATION BLADDER Height & Weight     Self, Time, Situation, Place  Normal Continent Weight: 259 lb 4.2 oz (117.6 kg) Height:  5\' 7"  (170.2 cm)  BEHAVIORAL SYMPTOMS/MOOD NEUROLOGICAL BOWEL NUTRITION STATUS   (None)  (Stroke) Incontinent Diet (Carb modified)  AMBULATORY STATUS COMMUNICATION OF NEEDS Skin   Extensive Assist Verbally Skin abrasions, Surgical wounds                       Personal Care Assistance Level of Assistance  Bathing, Feeding, Dressing Bathing Assistance: Limited assistance Feeding assistance: Limited assistance Dressing Assistance: Maximum assistance     Functional Limitations Info  Sight, Hearing, Speech Sight Info: Adequate Hearing Info:  Adequate Speech Info: Adequate    SPECIAL CARE FACTORS FREQUENCY  PT (By licensed PT), OT (By licensed OT), Blood pressure, Diabetic urine testing, Speech therapy     PT Frequency: 5 x week OT Frequency: 5 x week     Speech Therapy Frequency: 2 x week      Contractures Contractures Info: Not present    Additional Factors Info  Code Status, Allergies Code Status Info: Full Allergies Info: NKDA           Current Medications (09/01/2016):  This is the current hospital active medication list Current Facility-Administered Medications  Medication Dose Route Frequency Provider Last Rate Last Dose  . 0.9 %  sodium chloride infusion  50 mL Intravenous Once Lavera Guiseana Duo Liu, MD      . 0.9 %  sodium chloride infusion   Intravenous Continuous Rejeana BrockMcNeill P Kirkpatrick, MD 75 mL/hr at 09/01/16 [phone]    . acetaminophen (TYLENOL) tablet 650 mg  650 mg Oral Q4H PRN Rejeana BrockMcNeill P Kirkpatrick, MD       Or  . acetaminophen (TYLENOL) solution 650 mg  650 mg Per Tube Q4H PRN Rejeana BrockMcNeill P Kirkpatrick, MD       Or  . acetaminophen (TYLENOL) suppository 650 mg  650 mg Rectal Q4H PRN Rejeana BrockMcNeill P Kirkpatrick, MD      . aspirin EC tablet 325 mg  325 mg Oral Daily David L Rinehuls, PA-C   325 mg at 09/01/16 1213  . atorvastatin (LIPITOR) tablet 20 mg  20  mg Oral q1800 David L Rinehuls, PA-C   20 mg at 08/31/16 1610  . feeding supplement (ENSURE ENLIVE) (ENSURE ENLIVE) liquid 237 mL  237 mL Oral BID BM Micki Riley, MD   237 mL at 09/01/16 1122  . gentamicin cream (GARAMYCIN) 0.1 %   Topical Once per day on Tue Thu Sat Micki Riley, MD      . pantoprazole (PROTONIX) EC tablet 40 mg  40 mg Oral QHS Micki Riley, MD   40 mg at 08/31/16 2210  . traZODone (DESYREL) tablet 100 mg  100 mg Oral QHS Noel Christmas   100 mg at 09/01/16 0036     Discharge Medications: Please see discharge summary for a list of discharge medications.  Relevant Imaging Results:  Relevant Lab Results:   Additional Information SS#:  [id number]  Margarito Liner, LCSW  I have personally examined this patient, reviewed notes, independently viewed imaging studies, participated in medical decision making and plan of care.ROS completed by me personally and pertinent positives fully documented  I have made any additions or clarifications directly to the above note. Agree with note above.   Delia Heady, MD Medical Director [facility] Stroke Center Pager: [phone] 09/02/2016 1:09 PM

## 2016-09-01 NOTE — Progress Notes (Signed)
Rehab admissions - Please see rehab consult done by Dr. Allena KatzPatel today recommending inpatient rehab.  I will open the case with [employer] and request inpatient rehab admission.  Currently rehab beds are full.  I will follow up tomorrow with bed availability and insurance update.  Call me for questions.  #[phone]

## 2016-09-01 NOTE — Care Management Note (Signed)
Case Management Note  Patient Details  Name: Aaron Moon MRN: [id number] Date of Birth: November 03, 1945  Subjective/Objective:      Pt admitted with CVA. He is from home alone.               Action/Plan: PT/OT recommendations are for CIR. CM following for d/c disposition.   Expected Discharge Date:                  Expected Discharge Plan:  IP Rehab Facility  In-House Referral:     Discharge planning Services     Post Acute Care Choice:    Choice offered to:     DME Arranged:    DME Agency:     HH Arranged:    HH Agency:     Status of Service:  In process, will continue to follow  If discussed at Long Length of Stay Meetings, dates discussed:    Additional Comments:  Kermit BaloKelli F Arch Methot, RN 09/01/2016, 3:20 PM

## 2016-09-02 ENCOUNTER — Encounter (HOSPITAL_COMMUNITY): Payer: Self-pay | Admitting: Nurse Practitioner

## 2016-09-02 ENCOUNTER — Inpatient Hospital Stay (HOSPITAL_COMMUNITY): Payer: Federal, State, Local not specified - PPO

## 2016-09-02 ENCOUNTER — Encounter (HOSPITAL_COMMUNITY)
Admission: EM | Disposition: A | Discharge status: Rehabilitation Facility | Payer: Self-pay | Source: Home / Self Care | Attending: Neurology

## 2016-09-02 ENCOUNTER — Encounter (HOSPITAL_COMMUNITY): Payer: Self-pay

## 2016-09-02 ENCOUNTER — Inpatient Hospital Stay (HOSPITAL_COMMUNITY)
Admission: RE | Admit: 2016-09-02 | Discharge: 2016-09-17 | DRG: 092 | Disposition: A | Discharge status: Home Health | Payer: Federal, State, Local not specified - PPO | Source: Intra-hospital | Attending: Physical Medicine & Rehabilitation | Admitting: Physical Medicine & Rehabilitation

## 2016-09-02 DIAGNOSIS — E8809 Other disorders of plasma-protein metabolism, not elsewhere classified: Secondary | ICD-10-CM | POA: Diagnosis present

## 2016-09-02 DIAGNOSIS — I959 Hypotension, unspecified: Secondary | ICD-10-CM | POA: Diagnosis not present

## 2016-09-02 DIAGNOSIS — Z8739 Personal history of other diseases of the musculoskeletal system and connective tissue: Secondary | ICD-10-CM

## 2016-09-02 DIAGNOSIS — G8191 Hemiplegia, unspecified affecting right dominant side: Secondary | ICD-10-CM | POA: Diagnosis not present

## 2016-09-02 DIAGNOSIS — F4323 Adjustment disorder with mixed anxiety and depressed mood: Secondary | ICD-10-CM

## 2016-09-02 DIAGNOSIS — M109 Gout, unspecified: Secondary | ICD-10-CM | POA: Diagnosis present

## 2016-09-02 DIAGNOSIS — R0989 Other specified symptoms and signs involving the circulatory and respiratory systems: Secondary | ICD-10-CM | POA: Diagnosis present

## 2016-09-02 DIAGNOSIS — M2011 Hallux valgus (acquired), right foot: Secondary | ICD-10-CM | POA: Diagnosis present

## 2016-09-02 DIAGNOSIS — I69331 Monoplegia of upper limb following cerebral infarction affecting right dominant side: Secondary | ICD-10-CM | POA: Diagnosis not present

## 2016-09-02 DIAGNOSIS — Z9889 Other specified postprocedural states: Secondary | ICD-10-CM

## 2016-09-02 DIAGNOSIS — E46 Unspecified protein-calorie malnutrition: Secondary | ICD-10-CM | POA: Clinically undetermined

## 2016-09-02 DIAGNOSIS — R29810 Facial weakness: Secondary | ICD-10-CM | POA: Diagnosis present

## 2016-09-02 DIAGNOSIS — I63512 Cerebral infarction due to unspecified occlusion or stenosis of left middle cerebral artery: Secondary | ICD-10-CM | POA: Diagnosis not present

## 2016-09-02 DIAGNOSIS — E785 Hyperlipidemia, unspecified: Secondary | ICD-10-CM | POA: Diagnosis present

## 2016-09-02 DIAGNOSIS — Y838 Other surgical procedures as the cause of abnormal reaction of the patient, or of later complication, without mention of misadventure at the time of the procedure: Secondary | ICD-10-CM | POA: Diagnosis not present

## 2016-09-02 DIAGNOSIS — I69393 Ataxia following cerebral infarction: Secondary | ICD-10-CM | POA: Diagnosis not present

## 2016-09-02 DIAGNOSIS — R2689 Other abnormalities of gait and mobility: Secondary | ICD-10-CM | POA: Diagnosis present

## 2016-09-02 DIAGNOSIS — I639 Cerebral infarction, unspecified: Secondary | ICD-10-CM

## 2016-09-02 DIAGNOSIS — R4701 Aphasia: Secondary | ICD-10-CM | POA: Diagnosis not present

## 2016-09-02 DIAGNOSIS — M21611 Bunion of right foot: Secondary | ICD-10-CM | POA: Diagnosis present

## 2016-09-02 DIAGNOSIS — S76311A Strain of muscle, fascia and tendon of the posterior muscle group at thigh level, right thigh, initial encounter: Secondary | ICD-10-CM | POA: Diagnosis not present

## 2016-09-02 DIAGNOSIS — Z79899 Other long term (current) drug therapy: Secondary | ICD-10-CM | POA: Diagnosis not present

## 2016-09-02 DIAGNOSIS — F5101 Primary insomnia: Secondary | ICD-10-CM | POA: Diagnosis present

## 2016-09-02 DIAGNOSIS — E1140 Type 2 diabetes mellitus with diabetic neuropathy, unspecified: Secondary | ICD-10-CM | POA: Diagnosis present

## 2016-09-02 DIAGNOSIS — L7622 Postprocedural hemorrhage and hematoma of skin and subcutaneous tissue following other procedure: Secondary | ICD-10-CM | POA: Diagnosis not present

## 2016-09-02 DIAGNOSIS — E1142 Type 2 diabetes mellitus with diabetic polyneuropathy: Secondary | ICD-10-CM | POA: Diagnosis not present

## 2016-09-02 DIAGNOSIS — D62 Acute posthemorrhagic anemia: Secondary | ICD-10-CM

## 2016-09-02 DIAGNOSIS — Z95818 Presence of other cardiac implants and grafts: Secondary | ICD-10-CM | POA: Diagnosis not present

## 2016-09-02 DIAGNOSIS — E669 Obesity, unspecified: Secondary | ICD-10-CM | POA: Diagnosis present

## 2016-09-02 DIAGNOSIS — Z7984 Long term (current) use of oral hypoglycemic drugs: Secondary | ICD-10-CM

## 2016-09-02 DIAGNOSIS — Z87891 Personal history of nicotine dependence: Secondary | ICD-10-CM

## 2016-09-02 DIAGNOSIS — I351 Nonrheumatic aortic (valve) insufficiency: Secondary | ICD-10-CM

## 2016-09-02 DIAGNOSIS — E1159 Type 2 diabetes mellitus with other circulatory complications: Secondary | ICD-10-CM | POA: Diagnosis not present

## 2016-09-02 DIAGNOSIS — R413 Other amnesia: Secondary | ICD-10-CM | POA: Diagnosis not present

## 2016-09-02 DIAGNOSIS — Z6839 Body mass index (BMI) 39.0-39.9, adult: Secondary | ICD-10-CM

## 2016-09-02 DIAGNOSIS — E119 Type 2 diabetes mellitus without complications: Secondary | ICD-10-CM

## 2016-09-02 HISTORY — PX: EP IMPLANTABLE DEVICE: SHX172B

## 2016-09-02 HISTORY — PX: TEE WITHOUT CARDIOVERSION: SHX5443

## 2016-09-02 LAB — GLUCOSE, CAPILLARY
GLUCOSE-CAPILLARY: 137 mg/dL — AB (ref 65–99)
Glucose-Capillary: 152 mg/dL — ABNORMAL HIGH (ref 65–99)
Glucose-Capillary: 155 mg/dL — ABNORMAL HIGH (ref 65–99)

## 2016-09-02 SURGERY — ECHOCARDIOGRAM, TRANSESOPHAGEAL
Anesthesia: Moderate Sedation

## 2016-09-02 SURGERY — LOOP RECORDER INSERTION

## 2016-09-02 MED ORDER — PANTOPRAZOLE SODIUM 40 MG PO TBEC
40.0000 mg | DELAYED_RELEASE_TABLET | Freq: Every day | ORAL | Status: DC
  Administered 2016-09-02 – 2016-09-16 (×15): 40 mg via ORAL
  Filled 2016-09-02 (×15): qty 1

## 2016-09-02 MED ORDER — LIDOCAINE HCL (PF) 1 % IJ SOLN
INTRAMUSCULAR | Status: AC
  Filled 2016-09-02: qty 30

## 2016-09-02 MED ORDER — ALUM & MAG HYDROXIDE-SIMETH 200-200-20 MG/5ML PO SUSP: 30.0000 mL | ORAL | Status: DC | PRN

## 2016-09-02 MED ORDER — INSULIN ASPART 100 UNIT/ML ~~LOC~~ SOLN: 0.0000 [IU] | Freq: Every day | SUBCUTANEOUS | Status: DC

## 2016-09-02 MED ORDER — ENOXAPARIN SODIUM 40 MG/0.4ML ~~LOC~~ SOLN
40.0000 mg | SUBCUTANEOUS | Status: DC
  Administered 2016-09-04 – 2016-09-16 (×13): 40 mg via SUBCUTANEOUS
  Filled 2016-09-02 (×13): qty 0.4

## 2016-09-02 MED ORDER — TRAZODONE HCL 50 MG PO TABS
100.0000 mg | ORAL_TABLET | Freq: Every day | ORAL | Status: DC
  Administered 2016-09-02 – 2016-09-16 (×15): 100 mg via ORAL
  Filled 2016-09-02 (×15): qty 2

## 2016-09-02 MED ORDER — FENTANYL CITRATE (PF) 100 MCG/2ML IJ SOLN
INTRAMUSCULAR | Status: AC
  Filled 2016-09-02: qty 2

## 2016-09-02 MED ORDER — GENTAMICIN SULFATE 0.1 % EX CREA
TOPICAL_CREAM | CUTANEOUS | Status: DC
  Administered 2016-09-03 – 2016-09-15 (×4): via TOPICAL
  Filled 2016-09-02 (×2): qty 15

## 2016-09-02 MED ORDER — ATORVASTATIN CALCIUM 20 MG PO TABS
20.0000 mg | ORAL_TABLET | Freq: Every day | ORAL | Status: DC
  Administered 2016-09-03 – 2016-09-16 (×14): 20 mg via ORAL
  Filled 2016-09-02 (×14): qty 1

## 2016-09-02 MED ORDER — BISACODYL 10 MG RE SUPP: 10.0000 mg | Freq: Every day | RECTAL | Status: DC | PRN

## 2016-09-02 MED ORDER — ENSURE ENLIVE PO LIQD
237.0000 mL | Freq: Two times a day (BID) | ORAL | Status: DC
  Administered 2016-09-03 – 2016-09-10 (×12): 237 mL via ORAL

## 2016-09-02 MED ORDER — DIPHENHYDRAMINE HCL 12.5 MG/5ML PO ELIX: 12.5000 mg | ORAL_SOLUTION | Freq: Four times a day (QID) | ORAL | Status: DC | PRN

## 2016-09-02 MED ORDER — ACETAMINOPHEN 325 MG PO TABS
325.0000 mg | ORAL_TABLET | ORAL | Status: DC | PRN
  Administered 2016-09-02 – 2016-09-16 (×25): 650 mg via ORAL
  Filled 2016-09-02 (×26): qty 2

## 2016-09-02 MED ORDER — INSULIN ASPART 100 UNIT/ML ~~LOC~~ SOLN
0.0000 [IU] | Freq: Three times a day (TID) | SUBCUTANEOUS | Status: DC
  Administered 2016-09-03 (×3): 1 [IU] via SUBCUTANEOUS
  Administered 2016-09-04: 2 [IU] via SUBCUTANEOUS
  Administered 2016-09-04 – 2016-09-05 (×3): 1 [IU] via SUBCUTANEOUS
  Administered 2016-09-05 (×2): 2 [IU] via SUBCUTANEOUS
  Administered 2016-09-06: 1 [IU] via SUBCUTANEOUS
  Administered 2016-09-06 – 2016-09-07 (×4): 2 [IU] via SUBCUTANEOUS
  Administered 2016-09-07: 1 [IU] via SUBCUTANEOUS
  Administered 2016-09-08: 2 [IU] via SUBCUTANEOUS
  Administered 2016-09-08: 1 [IU] via SUBCUTANEOUS
  Administered 2016-09-08 – 2016-09-09 (×2): 2 [IU] via SUBCUTANEOUS
  Administered 2016-09-09: 1 [IU] via SUBCUTANEOUS
  Administered 2016-09-09: 2 [IU] via SUBCUTANEOUS
  Administered 2016-09-10: 1 [IU] via SUBCUTANEOUS
  Administered 2016-09-10: 2 [IU] via SUBCUTANEOUS
  Administered 2016-09-10 – 2016-09-13 (×8): 1 [IU] via SUBCUTANEOUS
  Administered 2016-09-13: 2 [IU] via SUBCUTANEOUS
  Administered 2016-09-14 (×3): 1 [IU] via SUBCUTANEOUS
  Administered 2016-09-15: 2 [IU] via SUBCUTANEOUS
  Administered 2016-09-15 – 2016-09-16 (×2): 1 [IU] via SUBCUTANEOUS
  Administered 2016-09-16: 2 [IU] via SUBCUTANEOUS
  Administered 2016-09-17: 1 [IU] via SUBCUTANEOUS

## 2016-09-02 MED ORDER — FENTANYL CITRATE (PF) 100 MCG/2ML IJ SOLN
INTRAMUSCULAR | Status: DC | PRN
  Administered 2016-09-02: 25 ug via INTRAVENOUS

## 2016-09-02 MED ORDER — MIDAZOLAM HCL 5 MG/ML IJ SOLN
INTRAMUSCULAR | Status: AC
  Filled 2016-09-02: qty 2

## 2016-09-02 MED ORDER — PROCHLORPERAZINE EDISYLATE 5 MG/ML IJ SOLN: 5.0000 mg | Freq: Four times a day (QID) | INTRAMUSCULAR | Status: DC | PRN

## 2016-09-02 MED ORDER — PROCHLORPERAZINE 25 MG RE SUPP: 12.5000 mg | Freq: Four times a day (QID) | RECTAL | Status: DC | PRN

## 2016-09-02 MED ORDER — METFORMIN HCL 500 MG PO TABS
500.0000 mg | ORAL_TABLET | Freq: Every day | ORAL | Status: DC
  Administered 2016-09-03 – 2016-09-17 (×15): 500 mg via ORAL
  Filled 2016-09-02 (×15): qty 1

## 2016-09-02 MED ORDER — ASPIRIN EC 325 MG PO TBEC
325.0000 mg | DELAYED_RELEASE_TABLET | Freq: Every day | ORAL | Status: DC
  Administered 2016-09-03 – 2016-09-17 (×15): 325 mg via ORAL
  Filled 2016-09-02 (×15): qty 1

## 2016-09-02 MED ORDER — MIDAZOLAM HCL 10 MG/2ML IJ SOLN
INTRAMUSCULAR | Status: DC | PRN
  Administered 2016-09-02: 2 mg via INTRAVENOUS

## 2016-09-02 MED ORDER — FLEET ENEMA 7-19 GM/118ML RE ENEM: 1.0000 | ENEMA | Freq: Once | RECTAL | Status: DC | PRN

## 2016-09-02 MED ORDER — BUTAMBEN-TETRACAINE-BENZOCAINE 2-2-14 % EX AERO
INHALATION_SPRAY | CUTANEOUS | Status: DC | PRN
  Administered 2016-09-02: 2 via TOPICAL

## 2016-09-02 MED ORDER — POLYETHYLENE GLYCOL 3350 17 G PO PACK: 17.0000 g | PACK | Freq: Every day | ORAL | Status: DC | PRN

## 2016-09-02 MED ORDER — PROCHLORPERAZINE MALEATE 5 MG PO TABS: 5.0000 mg | ORAL_TABLET | Freq: Four times a day (QID) | ORAL | Status: DC | PRN

## 2016-09-02 MED ORDER — LIDOCAINE HCL (PF) 1 % IJ SOLN
INTRAMUSCULAR | Status: DC | PRN
  Administered 2016-09-02: 30 mL

## 2016-09-02 MED ORDER — GUAIFENESIN-DM 100-10 MG/5ML PO SYRP: 5.0000 mL | ORAL_SOLUTION | Freq: Four times a day (QID) | ORAL | Status: DC | PRN

## 2016-09-02 MED ORDER — ALBUTEROL SULFATE (2.5 MG/3ML) 0.083% IN NEBU: 3.0000 mL | INHALATION_SOLUTION | Freq: Four times a day (QID) | RESPIRATORY_TRACT | Status: DC | PRN

## 2016-09-02 SURGICAL SUPPLY — 2 items
LOOP REVEAL LINQSYS (Prosthesis & Implant Heart) ×3 IMPLANT
PACK LOOP INSERTION (CUSTOM PROCEDURE TRAY) ×3 IMPLANT

## 2016-09-02 NOTE — Progress Notes (Signed)
Called to patient's room by nurse tech.  Patient with bright red blood seeping through dressing, on abdomen, and dripping down left side.  Direct pressure placed to site.  Patient sitting up in chair.  Aaron Inaan Angiulli, PA, notified and stated to replace pressure dressing, and apply ice to site.  Old dressing removed and large clot assessed directly over incision site.  Clot left in place.  Some oozing of bright red blood present.  Two ABD pads placed over site and taped tightly to secure and provide pressure. Patient placed in bed and ice pack placed over dressing.  Patient instructed to keep left arm still.  Patient continued to request to ambulate to bathroom to void.  Explained to patient that he needs to be still to allow for clotting and to decrease risk of breaking clot away from site.  Condom cath placed per his request as he states " I can't use the urinal without standing up".    Kelli HopeBarber, Isola Mehlman M

## 2016-09-02 NOTE — Progress Notes (Signed)
Rehab admissions - I met with patient this am.  He would like inpatient rehab admission.  He tells me that he also has VA benefits at 80%.  I have authorization for acute inpatient rehab admission for today.  Once patient has completed TEE and Loop recorder and attending MD has cleared patient medically, can admit to acute inpatient rehab later today.  Call me for questions.  #[phone]

## 2016-09-02 NOTE — Interval H&P Note (Signed)
History and Physical Interval Note:  09/02/2016 12:09 PM  [name]  has presented today for surgery, with the diagnosis of stroke  The various methods of treatment have been discussed with the patient and family. After consideration of risks, benefits and other options for treatment, the patient has consented to  Procedure(s): TRANSESOPHAGEAL ECHOCARDIOGRAM (TEE) (N/A) as a surgical intervention .  The patient's history has been reviewed, patient examined, no change in status, stable for surgery.  I have reviewed the patient's chart and labs.  Questions were answered to the patient's satisfaction.     Terryn Rosenkranz [employer]

## 2016-09-02 NOTE — Progress Notes (Signed)
Report called in to nurse, Whitney on rehab unit. Pt left unit for loop recorder.

## 2016-09-02 NOTE — Discharge Summary (Signed)
Stroke Discharge Summary  Patient ID: Aaron Moon   MRN: [id number]      DOB: January 23, 1946  Date of Admission: 08/30/2016 Date of Discharge: 09/02/2016  Attending Physician:  Micki Riley, MD, Stroke MD Consulting Physician(s):  Treatment Team:  Md Stroke, MD cardiology and rehabilitation medicine Patient's PCP:  Pcp Not In System  Discharge Diagnoses: LEFT MCA territory infarct and RIGHT MCA territory  Embolic infarcts Of cryptogenic etiology.  Active Problems:   CVA (cerebral vascular accident) (HCC)   Stroke (cerebrum) (HCC)   Acute ischemic stroke (HCC)   Idiopathic gout   Morbid obesity (HCC)   Diastolic dysfunction   Benign essential HTN   Diabetes mellitus (HCC)   Leukocytosis BMI  Body mass index is 40.61 kg/m.  Past Medical History:  Diagnosis Date  . Gout   . Hyperlipidemia   . Obesity    Past Surgical History:  Procedure Laterality Date  . ABDOMINAL SURGERY     colostomy & colostomy reversal  . ANKLE SURGERY      Medications to be continued on Rehab . [MAR Hold] sodium chloride  50 mL Intravenous Once  . [MAR Hold] aspirin EC  325 mg Oral Daily  . [MAR Hold] atorvastatin  20 mg Oral q1800  . [MAR Hold] feeding supplement (ENSURE ENLIVE)  237 mL Oral BID BM  . [MAR Hold] gentamicin cream   Topical Once per day on Tue Thu Sat  . [MAR Hold] insulin aspart  0-9 Units Subcutaneous TID WC  . [MAR Hold] pantoprazole  40 mg Oral QHS  . [MAR Hold] traZODone  100 mg Oral QHS    LABORATORY STUDIES CBC    Component Value Date/Time   WBC 11.8 (H) 08/30/2016 1845   RBC 4.89 08/30/2016 1845   HGB 13.8 08/30/2016 1845   HCT 41.8 08/30/2016 1845   PLT 257 08/30/2016 1845   MCV 85.5 08/30/2016 1845   MCV 86.5 07/22/2015 0924   MCH 28.2 08/30/2016 1845   MCHC 33.0 08/30/2016 1845   RDW 13.9 08/30/2016 1845   LYMPHSABS 3.5 08/30/2016 1845   MONOABS 1.1 (H) 08/30/2016 1845   EOSABS 0.2 08/30/2016 1845   BASOSABS 0.0 08/30/2016 1845   CMP    Component  Value Date/Time   NA 139 08/31/2016 0319   K 4.2 08/31/2016 0319   CL 108 08/31/2016 0319   CO2 21 (L) 08/31/2016 0319   GLUCOSE 130 (H) 08/31/2016 0319   BUN 8 08/31/2016 0319   CREATININE 0.98 08/31/2016 0319   CREATININE 1.09 07/22/2015 0909   CALCIUM 9.0 08/31/2016 0319   PROT 7.3 08/30/2016 1845   ALBUMIN 4.2 08/30/2016 1845   AST 48 (H) 08/30/2016 1845   ALT 38 08/30/2016 1845   ALKPHOS 61 08/30/2016 1845   BILITOT 1.0 08/30/2016 1845   GFRNONAA >60 08/31/2016 0319   GFRNONAA 69 07/22/2015 0909   GFRAA >60 08/31/2016 0319   GFRAA 80 07/22/2015 0909   COAGS Lab Results  Component Value Date   INR 0.96 08/30/2016   Lipid Panel    Component Value Date/Time   CHOL 179 08/31/2016 0319   TRIG 291 (H) 08/31/2016 0319   HDL 30 (L) 08/31/2016 0319   CHOLHDL 6.0 08/31/2016 0319   VLDL 58 (H) 08/31/2016 0319   LDLCALC 91 08/31/2016 0319   HgbA1C  Lab Results  Component Value Date   HGBA1C 7.0 (H) 08/31/2016   Cardiac Panel (last 3 results) No results for input(s): CKTOTAL, CKMB, TROPONINI,  RELINDX in the last 72 hours. Urinalysis    Component Value Date/Time   BILIRUBINUR negative 07/22/2015 0924   BILIRUBINUR neg 02/19/2014 0933   KETONESUR negative 07/22/2015 0924   PROTEINUR negative 07/22/2015 0924   PROTEINUR neg 02/19/2014 0933   UROBILINOGEN 0.2 07/22/2015 0924   NITRITE Negative 07/22/2015 0924   NITRITE neg 02/19/2014 0933   LEUKOCYTESUR Negative 07/22/2015 0924   Urine Drug Screen No results found for: LABOPIA, COCAINSCRNUR, LABBENZ, AMPHETMU, THCU, LABBARB  Alcohol Level No results found for: [facility]   SIGNIFICANT DIAGNOSTIC STUDIES   Ct Angio Head and Neck W Or Wo Contrast 08/30/2016 1. Mild atherosclerosis at the carotid bifurcations in the neck without stenosis.  2. Moderate proximal left vertebral artery stenosis.  3. Mild intracranial atherosclerosis without major vessel occlusion or significant proximal stenosis.    Ct Head Code Stroke  W/o Cm 08/30/2016 1. No evidence of acute intracranial abnormality.  2. ASPECTS is 10.  3. Mild chronic small vessel ischemic disease including chronic lacunar infarcts as above.    MRI brain  08/31/2016 :  Acute patchy small to moderate LEFT MCA territory infarct with petechial hemorrhage and small RIGHT nonhemorrhagic MCA territory infarct.  Findings concerning for central embolic etiology. 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   CT head  08/31/2016  Similar size and distribution of acute patchy small to moderate bilateral MCA territory infarcts, left greater than right.  No evidence for hemorrhagic transformation or other complication status post tPA.  Transthoracic Echocardiogram 08/31/2016 Study Conclusions - Left ventricle: The cavity size was normal. There was mild   concentric hypertrophy. Systolic function was normal. Wall motion   was normal; there were no regional wall motion abnormalities.   Doppler parameters are consistent with abnormal left ventricular   relaxation (grade 1 diastolic dysfunction). - Aortic valve: Trileaflet; normal thickness leaflets. There was   mild regurgitation. - Aortic root: The aortic root was normal in size. - Mitral valve: Structurally normal valve. There was no   regurgitation. - Right ventricle: The cavity size was normal. Wall thickness was   normal. Systolic function was normal. - Right atrium: The atrium was normal in size. - Tricuspid valve: There was no regurgitation. - Pulmonic valve: There was no regurgitation. - Pulmonary arteries: Systolic pressure was within the normal   range. - Inferior vena cava: The vessel was normal in size. - Pericardium, extracardiac: There was no pericardial effusion.    Transesophageal echocardiogram 09/02/2016 Pending    HISTORY OF PRESENT ILLNESS [name] is a 71 y.o. male who was in his normal state of health until 5:30 pm on the evening of admission.  He was watching TV when he had sudden onset of right hand weakness. He went to [facility] ED where a code stroke was called, and given his deficits tPA was administered prior to transfer. On arrival to [facility], he had Arm >>> leg and face weakness.   LKW: 5:30 pm tpa given?: yes  HOSPITAL COURSE Mr. Dequane Strahan is a 71 y.o. male with a history of hyperlipidemia, obesity, and gout presenting with right upper extremity weakness. He received IV t-PA Sunday 08/30/2016 at 1915. He was subsequently admitted to the neuro intensive care unit for close monitoring.   Stroke:  Dominant infarct likely secondary to central cardiac source of emboli.  Resultant  RUE paresis and incoordination  MRI - Acute patchy small to moderate LEFT MCA territory infarct with petechial hemorrhage and small RIGHT nonhemorrhagic  MCA territory infarct.  MRA - not performed  CTA H&N -  Moderate proximal left vertebral artery stenosis.   Carotid Doppler - CTA neck  Bilateral lower extremity venous Dopplers - pending  2D Echo - see above  TEE - pending  Possible loop recorder implant  LDL - 91  HgbA1c - 7.0  VTE prophylaxis - SCDs  Diet Carb Modified Fluid consistency: Thin; Room service appropriate? Yes  No antithrombotic prior to admission, now on aspirin 325 mg daily  Patient counseled to be compliant with his antithrombotic medications  Ongoing aggressive stroke risk factor management  Therapy recommendations: - CIR  Disposition: CIR  Hypertension  Stable              Permissive hypertension (OK if < 220/120) but gradually normalize in 5-7 days              Long-term BP goal normotensive  Hyperlipidemia  Home meds:  No lipid lowering medications prior to admission.  LDL 91, goal < 70  Add low-dose Lipitor 20 mg daily  Continue statin at discharge    Other Stroke Risk Factors  Advanced age  The patient quit smoking cigarettes.  ETOH use, advised to drink no more than  1 drink per day  Obesity, Body mass index is 40.61 kg/m., recommend weight loss, diet and exercise as appropriate    Other Active Problems  Mild leukocytosis 11.8 - afebrile  Recent foot surgery - wound care nurse to follow.   PLAN  Await TEE results  Possible loop recorder implant  The patient will need full anticoagulation if atrial fibrillation or cardiac thrombus is diagnosed.  [facility] once workup is completed.  Follow-up with Dr. Pearlean BrownieSethi in 6-8 weeks  Follow-up with his surgeon after discharge.    DISCHARGE EXAM Blood pressure (!) 154/72, pulse (!) 55, temperature 98.1 F (36.7 C), temperature source Oral, resp. rate 17, height 5\' 7"  (1.702 m), weight 117.6 kg (259 lb 4.2 oz), SpO2 95 %. Obese elderly male not in distress. . Afebrile. Head is nontraumatic. Neck is supple without bruit.    Cardiac exam no murmur or gallop. Lungs are clear to auscultation. Distal pulses are not felt due to boots on both feet from recent foot surgery. Neurological Exam :  Awake alert oriented 3. Mild dysarthria. No aphasia. Follows commands well. Extraocular moments are full range without nystagmus. Fundi were not visualized. Vision acuity and fields appear normal. Mild right lower facial asymmetry. Tongue midline. Right upper extremity weakness 3/5 strength with drift. Mild 4+/5 strength in the right lower extremity but exam limited due to wearing boot and recent surgery. Coordination impaired in the right upper extremity. Diminished touch pinprick sensation in the right upper extremity only and normal elsewhere. Deep tendon reflexes 1+ symmetric. Plantars cannot be tested as wearing boots on both feet. Gait was not tested.   Discharge Diet  Diet NPO time specified Except for: Sips with Meds liquids (for TEE)   DISCHARGE PLAN  Disposition:  [facility] for ongoing PT, OT and ST  aspirin 325  mg daily for secondary stroke prevention.  Recommend ongoing risk factor control by Primary Care Physician at time of discharge from inpatient rehabilitation.  Follow-up Pcp Not In System in 2 weeks following discharge from rehab.  Follow-up with Dr. Delia HeadyPramod Sethi, Stroke Clinic in 6 weeks, office to schedule an appointment.   40 minutes were spent preparing discharge.  Delton See PA-C [facility] Pager [phone] 09/02/2016, 1:51 PM  I have personally examined this patient, reviewed notes, independently viewed imaging studies, participated in medical decision making and plan of care.ROS completed by me personally and pertinent positives fully documented  I have made any additions or clarifications directly to the above note. Agree with note above.    Delia Heady, MD Medical Director [facility] Stroke Center Pager: [phone] 09/02/2016 2:55 PM

## 2016-09-02 NOTE — Progress Notes (Signed)
PT Cancellation Note  Patient Details Name: Aaron Moon Copher MRN: [id number] DOB: [birth year]   Cancelled Treatment:    Reason Eval/Treat Not Completed: Patient at procedure or test/unavailable; patient in vascular lab, will attempt again as time permits.   Elray McgregorCynthia Wynn 09/02/2016, 2:08 PM  Sheran Lawlessyndi Wynn, [place] [phone] 09/02/2016

## 2016-09-02 NOTE — Progress Notes (Signed)
Pt transferred to 4MW2 taking all personal belongings. Wife at bedside.

## 2016-09-02 NOTE — Progress Notes (Signed)
Dressing to left chest clean, dry and intact.  No evidence of bleed through.  Ice pack remains in place.    Kelli HopeBarber, Ellyana Crigler M

## 2016-09-02 NOTE — Progress Notes (Signed)
  Echocardiogram Echocardiogram Transesophageal has been performed.  Nolon RodBrown, Tony 09/02/2016, 1:49 PM

## 2016-09-02 NOTE — Progress Notes (Signed)
SLP Cancellation Note  Patient Details Name: Aaron SouDana Moon MRN: [id number] DOB: [birth year]   Cancelled treatment:       Reason Eval/Treat Not Completed: Patient at procedure or test/unavailable   Maxcine Hamaiewonsky, Ashraf Mesta 09/02/2016, 4:13 PM  Maxcine HamLaura Paiewonsky, M.A. CCC-SLP [phone]

## 2016-09-02 NOTE — Progress Notes (Signed)
Patient's dressing with small amount of blood to lower right corner. Tape loosened to top of dressing. Patient stated he was wondering if the bleeding had stopped.  Educated patient to leave dressing to chest alone and not to loosen tape.  Ice pack replaced to chest/dressing.    Kelli HopeBarber, Heavan Francom M

## 2016-09-02 NOTE — Progress Notes (Signed)
Pt leaving unit for TEE/Loop.

## 2016-09-02 NOTE — Progress Notes (Signed)
Pt returned from loop procedure with old blood noted to  dressing site  to  left upper chest. Site assessed with nurse, Arline Aspindy.

## 2016-09-02 NOTE — Progress Notes (Signed)
Patient ID: Aaron Moon, male   DOB: Oct 21, 1945, 71 y.o.   MRN: [id number] Patient admitted to 4M02 via wheelchair, escorted by nursing staff.  Patient verbalized understanding of rehab process, signed fall safety agreement.  Bed alarm on r/t decreased safety awareness.  Appears to be in no immediate distress at this time.  Dani Gobbleeardon, Camauri Craton J, RN

## 2016-09-02 NOTE — Progress Notes (Signed)
Pt returned from Vascular. 

## 2016-09-02 NOTE — Progress Notes (Signed)
*  PRELIMINARY RESULTS* Vascular Ultrasound Bilateral lower extremity venous duplex has been completed.  Preliminary findings: No evidence of deep vein thrombosis or baker's cysts bilaterally.   Chauncey FischerCharlotte C Faruq Rosenberger 09/02/2016, 2:37 PM

## 2016-09-02 NOTE — Progress Notes (Signed)
Physical Therapy Treatment Patient Details Name: Aaron Moon MRN: [id number] DOB: 1946-01-22 Today's Date: 09/02/2016    History of Present Illness Patient is a 71 y/o male with hx of obesity, HLD and gout presents with right sided weakness. Head CT-unremarkable. s/p tPA. MRI pending.    PT Comments    Patient able to mobilize with knee walker, but not safely.  Also unable to perform hygiene after toileting.  Feel appropriate for CIR level rehab at this time.   Follow Up Recommendations  CIR     Equipment Recommendations  Other (comment)    Recommendations for Other Services       Precautions / Restrictions Precautions Precautions: Fall Other Brace/Splint: CAM boot RLE; post op shoe on left Restrictions RLE Weight Bearing: Partial weight bearing    Mobility  Bed Mobility Overal bed mobility: Needs Assistance Bed Mobility: Supine to Sit     Supine to sit: Min guard;HOB elevated     General bed mobility comments: for safety, throwing himself around in bed  Transfers Overall transfer level: Needs assistance Equipment used: Rolling walker (2 wheeled) (and knee walker) Transfers: Sit to/from Stand Sit to Stand: Min assist         General transfer comment: for safe technique, used camboot but not TDWB with regular walker, states MD said he can walk short distances just to limit is  Ambulation/Gait Ambulation/Gait assistance: Min assist Ambulation Distance (Feet): 120 Feet (& 12' w/ RW) Assistive device: Rolling walker (2 wheeled) (and knee walker) Gait Pattern/deviations: Step-to pattern;Step-through pattern;Wide base of support     General Gait Details: impulsive and unsafe esp with knee walker, though able to maintain weight bearing with increased distances.  upon sitting R hand doesn't release and walker falls on top of him. Also with turns scoots walker around unsafe and high fall risk   Stairs            Wheelchair Mobility    Modified Rankin  (Stroke Patients Only) Modified Rankin (Stroke Patients Only) Pre-Morbid Rankin Score: Moderate disability Modified Rankin: Moderately severe disability     Balance Overall balance assessment: Needs assistance           Standing balance-Leahy Scale: Poor Standing balance comment: requires min A                     Cognition Arousal/Alertness: Awake/alert Behavior During Therapy: Impulsive Overall Cognitive Status: Impaired/Different from baseline Area of Impairment: Attention;Following commands;Safety/judgement;Awareness;Problem solving   Current Attention Level: Sustained   Following Commands: Follows multi-step commands with increased time Safety/Judgement: Decreased awareness of safety;Decreased awareness of deficits Awareness: Intellectual        Exercises      General Comments        Pertinent Vitals/Pain Pain Assessment: Faces Faces Pain Scale: Hurts little more Pain Location: l hip sciatic pain Pain Intervention(s): Monitored during session;Repositioned    Home Living                      Prior Function            PT Goals (current goals can now be found in the care plan section) Progress towards PT goals: Progressing toward goals    Frequency    Min 3X/week      PT Plan Current plan remains appropriate    Co-evaluation             End of Session Equipment Utilized During Treatment: Gait belt Activity Tolerance:  Patient tolerated treatment well Patient left: in chair;with call bell/phone within reach;with chair alarm set     Time: [id number] PT Time Calculation (min) (ACUTE ONLY): 22 min  Charges:  $Gait Training: 8-22 mins                    G Codes:      Elray McgregorCynthia Rubi Tooley 09/02/2016, 3:38 PM Sheran Lawlessyndi Braniya Farrugia, PT [phone] 09/02/2016

## 2016-09-02 NOTE — Progress Notes (Signed)
Pt left unit for Loop procedure.

## 2016-09-02 NOTE — Progress Notes (Signed)
TEE completed, report received from nurse. Pt to have loop this afternoon.

## 2016-09-02 NOTE — Discharge Instructions (Signed)
Keep incision clean and dry for 3 days. °You can remove outer dressing tomorrow. °Leave steri-strips (little pieces of tape) on until seen in the office for wound check appointment. °Call the office ([phone]) for redness, drainage, swelling, or fever. ° °

## 2016-09-02 NOTE — Consult Note (Signed)
ELECTROPHYSIOLOGY CONSULT NOTE  Patient ID: Aaron Moon MRN: [id number], DOB/AGE: [birth year]   Admit date: 08/30/2016 Date of Consult: 09/02/2016  Primary Physician: Pcp Not In System Primary Cardiologist: none Reason for Consultation: Cryptogenic stroke ; recommendations regarding Implantable Loop Recorder Requesting MD: Dr. Pearlean Brownie  History of Present Illness Aaron Moon was admitted on 08/30/2016 with CVA.  He first developed symptoms while at home R hand weakness, then arm, leg and face symptoms as well transferred from [facility] to [facility].  He received tPA.  PMHx only for gout, obesity and HLD.   Imaging demonstrated Acute patchy small to moderate LEFT MCA territory infarct with petechial hemorrhage and small RIGHT nonhemorrhagic MCA territory infarct. Findings concerning for central embolic etiology. Mild chronic small vessel ischemic disease and old thalamus infarcts..  he has undergone workup for stroke including echocardiogram and carotid angio.  The patient has been monitored on telemetry which has demonstrated sinus rhythm with no arrhythmias.  Inpatient stroke work-up is to be completed with a TEE this afternoon.   Echocardiogram this admission demonstrated  Study Conclusions - Left ventricle: The cavity size was normal. There was mild   concentric hypertrophy. Systolic function was normal. Wall motion   was normal; there were no regional wall motion abnormalities.   Doppler parameters are consistent with abnormal left ventricular   relaxation (grade 1 diastolic dysfunction). - Aortic valve: Trileaflet; normal thickness leaflets. There was   mild regurgitation. - Aortic root: The aortic root was normal in size. - Mitral valve: Structurally normal valve. There was no   regurgitation. - Right ventricle: The cavity size was normal. Wall thickness was   normal. Systolic function was normal. - Right atrium: The atrium was normal in size. - Tricuspid valve: There was no regurgitation. -  Pulmonic valve: There was no regurgitation. - Pulmonary arteries: Systolic pressure was within the normal   range. - Inferior vena cava: The vessel was normal in size. - Pericardium, extracardiac: There was no pericardial effusion.   Lab work is reviewed.   Prior to admission, the patient denies chest pain, shortness of breath, dizziness, palpitations, or syncope.  They are recovering from their stroke with plans to CIR at discharge.  EP has been asked to evaluate for placement of an implantable loop recorder to monitor for atrial fibrillation.     Past Medical History:  Diagnosis Date  . Gout   . Hyperlipidemia   . Obesity      Surgical History:  Past Surgical History:  Procedure Laterality Date  . ABDOMINAL SURGERY     colostomy & colostomy reversal  . ANKLE SURGERY       Prescriptions Prior to Admission  Medication Sig Dispense Refill Last Dose  . gentamicin cream (GARAMYCIN) 0.1 % Apply 1 application topically 3 (three) times daily. (Patient taking differently: Apply 1 application topically daily as needed (wound care/dressing change). ) 30 g 1 08/29/2016 at Unknown time  . meloxicam (MOBIC) 15 MG tablet Take 15 mg by mouth daily.  1 08/30/2016 at Unknown time  . omeprazole (PRILOSEC) 20 MG capsule Take 20 mg by mouth daily as needed (acid reflux).    08/29/2016 at pm  . oxyCODONE-acetaminophen (ROXICET) 5-325 MG tablet Take 1 tablet by mouth every 8 (eight) hours as needed for severe pain. 30 tablet 0 08/30/2016 at 1500  . traZODone (DESYREL) 100 MG tablet Take 100 mg by mouth at bedtime.   08/29/2016 at Unknown time  . albuterol (PROVENTIL HFA;VENTOLIN HFA) 108 (90 BASE)  MCG/ACT inhaler Inhale 2 puffs into the lungs every 6 (six) hours as needed for wheezing. (Patient not taking: Reported on 08/30/2016) 1 Inhaler 0 Not Taking at Unknown time  . ALPRAZolam (XANAX) 0.5 MG tablet Take 1 tablet (0.5 mg total) by mouth at bedtime as needed. (Patient not taking: Reported on  08/30/2016) 90 tablet 0 Not Taking at Unknown time  . oxyCODONE-acetaminophen (ROXICET) 5-325 MG tablet Take 1 tablet by mouth every 8 (eight) hours as needed for severe pain. (Patient not taking: Reported on 08/30/2016) 30 tablet 0 Not Taking at Unknown time    Inpatient Medications:  . sodium chloride  50 mL Intravenous Once  . aspirin EC  325 mg Oral Daily  . atorvastatin  20 mg Oral q1800  . feeding supplement (ENSURE ENLIVE)  237 mL Oral BID BM  . gentamicin cream   Topical Once per day on Tue Thu Sat  . insulin aspart  0-9 Units Subcutaneous TID WC  . pantoprazole  40 mg Oral QHS  . traZODone  100 mg Oral QHS    Allergies: No Known Allergies  Social History   Social History  . Marital status: Single    Spouse name: N/A  . Number of children: N/A  . Years of education: N/A   Occupational History  . Not on file.   Social History Main Topics  . Smoking status: Former Games developer  . Smokeless tobacco: Never Used  . Alcohol use Yes  . Drug use: No  . Sexual activity: No   Other Topics Concern  . Not on file   Social History Narrative  . No narrative on file     Family History  Problem Relation Age of Onset  . ALS Mother   . Pancreatic cancer Father       Review of Systems: All other systems reviewed and are otherwise negative except as noted above.  Physical Exam: Vitals:   09/01/16 1817 09/01/16 2139 09/02/16 0058 09/02/16 0437  BP: (!) 165/95 (!) 155/94 (!) 143/69 120/60  Pulse: 77 (!) 59 (!) 59 60  Resp: 20 20 20 18   Temp: 98.5 F (36.9 C) 98 F (36.7 C) 98.6 F (37 C) 98.6 F (37 C)  TempSrc: Oral Oral Oral Oral  SpO2: 97% 98% 97% 96%  Weight:      Height:        GEN- The patient is obese, well appearing, alert and oriented x 3 today.   Head- normocephalic, atraumatic Eyes-  Sclera clear, conjunctiva pink Ears- hearing intact Oropharynx- clear Neck- supple Lungs- Clear to ausculation bilaterally, normal work of breathing Heart- Regular rate  and rhythm, no murmurs, rubs or gallops  GI- soft, NT, ND Extremities- no clubbing, cyanosis, or edema MS- no significant deformity or atrophy Skin- no rash or lesion Psych- euthymic mood, full affect   Labs:   Lab Results  Component Value Date   WBC 11.8 (H) 08/30/2016   HGB 13.8 08/30/2016   HCT 41.8 08/30/2016   MCV 85.5 08/30/2016   PLT 257 08/30/2016    Recent Labs Lab 08/30/16 1845 08/31/16 0319  NA 136 139  K 5.3* 4.2  CL 106 108  CO2 21* 21*  BUN 12 8  CREATININE 1.07 0.98  CALCIUM 8.9 9.0  PROT 7.3  --   BILITOT 1.0  --   ALKPHOS 61  --   ALT 38  --   AST 48*  --   GLUCOSE 122* 130*   No results found for:  CKTOTAL, CKMB, CKMBINDEX, TROPONINI Lab Results  Component Value Date   CHOL 179 08/31/2016   CHOL 165 07/22/2015   CHOL 165 02/19/2014   Lab Results  Component Value Date   HDL 30 (L) 08/31/2016   HDL 33 (L) 07/22/2015   HDL 32 (L) 02/19/2014   Lab Results  Component Value Date   LDLCALC 91 08/31/2016   LDLCALC 75 07/22/2015   LDLCALC 69 02/19/2014   Lab Results  Component Value Date   TRIG 291 (H) 08/31/2016   TRIG 283 (H) 07/22/2015   TRIG 321 (H) 02/19/2014   Lab Results  Component Value Date   CHOLHDL 6.0 08/31/2016   CHOLHDL 5.0 07/22/2015   CHOLHDL 5.2 02/19/2014   No results found for: LDLDIRECT  No results found for: DDIMER   Radiology/Studies:  Ct Angio Head W Or Wo Contrast Result Date: 08/30/2016 CLINICAL DATA:  Right arm weakness.  Status post tPA. EXAM: CT ANGIOGRAPHY HEAD AND NECK TECHNIQUE: Multidetector CT imaging of the head and neck was performed using the standard protocol during bolus administration of intravenous contrast. Multiplanar CT image reconstructions and MIPs were obtained to evaluate the vascular anatomy. Carotid stenosis measurements (when applicable) are obtained utilizing NASCET criteria, using the distal internal carotid diameter as the denominator. CONTRAST:  50 mL Isovue 370 COMPARISON:  None.  FINDINGS: CTA NECK FINDINGS Aortic arch: Normal variant aortic arch branching pattern with common origin of the brachiocephalic and left common carotid arteries. Mild aortic arch atherosclerosis. Mild non stenotic plaque at the left subclavian origin. Right carotid system: Medial course of the common carotid artery. Small volume noncalcified plaque in the proximal ICA with moderate luminal irregularity but no significant stenosis. Left carotid system: Medial course of the common carotid artery. Relatively small volume mixed calcified and noncalcified plaque involving the carotid bifurcation without significant stenosis. Vertebral arteries: Patent and codominant. Moderate proximal left P1 stenosis. Skeleton: Advanced disc degeneration C4-5, C5-6, and C6-7. Slight anterolisthesis of C7 on T1 with underlying facet arthrosis. Other neck: No mass or lymph node enlargement. Upper chest: Clear lung apices. Review of the MIP images confirms the above findings CTA HEAD FINDINGS Anterior circulation: The internal carotid arteries are patent from skullbase to carotid termini. There is mild-to-moderate siphon atherosclerosis bilaterally without significant stenosis. An infundibulum is noted at the left posterior communicating origin. ACAs and MCAs are patent without evidence of major branch occlusion or significant proximal stenosis. No aneurysm. Posterior circulation: Intracranial vertebral arteries are widely patent to the basilar. Dominant left PICA and right AICA. SCA origins are patent. Basilar artery is widely patent. There are patent posterior communicating arteries bilaterally, right larger than left. The right P1 segment is hypoplastic. No significant PCA stenosis is seen. No aneurysm. Venous sinuses: Grossly patent within limitations of arterial phase dominant contrast timing. Anatomic variants: None of significance. Review of the MIP images confirms the above findings IMPRESSION: 1. Mild atherosclerosis at the  carotid bifurcations in the neck without stenosis. 2. Moderate proximal left vertebral artery stenosis. 3. Mild intracranial atherosclerosis without major vessel occlusion or significant proximal stenosis. Electronically Signed   By: Sebastian AcheAllen  Grady M.D.   On: 08/30/2016 21:18   Ct Head Wo Contrast Result Date: 09/01/2016 CLINICAL DATA:  Follow-up exam status post tPA. EXAM: CT HEAD WITHOUT CONTRAST TECHNIQUE: Contiguous axial images were obtained from the base of the skull through the vertex without intravenous contrast. COMPARISON:  Prior CT 08/30/2016 and MRI from 08/31/2016. FINDINGS: Brain: Stable atrophy with mild chronic small vessel  ischemic disease. Evolving patchy acute bilateral MCA territory infarcts again seen, left greater than right. Overall, distribution similar nerve previous MRI. No significant mass effect. Few scattered subtle minimally hyperdense areas within the area of infarction likely reflect the previously seen petechial hemorrhage. No evidence for frank hemorrhagic transformation or other complication. No other acute large vessel territory infarct identified. No mass lesion, midline shift or mass effect. No hydrocephalus. No extra-axial fluid collection. Vascular: Vascular calcifications noted within the carotid siphons. No hyperdense vessel. Skull: Scalp soft tissues demonstrate no acute abnormality. Calvarium intact. Sinuses/Orbits: Globes orbits within normal limits. Paranasal sinuses are clear. No mastoid effusion. IMPRESSION: 1. Similar size and distribution of acute patchy small to moderate bilateral MCA territory infarcts, left greater than right. No evidence for hemorrhagic transformation or other complication status post tPA. 2. No other new acute intracranial process. Electronically Signed   By: Rise Mu M.D.   On: 09/01/2016 01:33   Mr Brain Wo Contrast Result Date: 08/31/2016 CLINICAL DATA:  Acute onset RIGHT hand weakness while watching television tonight. Status  post tPA. History of hyperlipidemia. EXAM: MRI HEAD WITHOUT CONTRAST TECHNIQUE: Multiplanar, multiecho pulse sequences of the brain and surrounding structures were obtained without intravenous contrast. COMPARISON:  CTA HEAD and CTA HEAD and neck August 30, 2016 FINDINGS: BRAIN: Patchy reduced diffusion LEFT greater than RIGHT parietal lobes reduced diffusion. Scattered LEFT frontal and LEFT basal ganglia foci of reduced diffusion. Corresponding low ADC values and patchy susceptibility artifact LEFT parietal lobe. Chronic micro hemorrhages central pons and RIGHT thalamus associated with chronic hypertension/infarcts. Old bilateral thalamus lacunar infarcts. Scattered subcentimeter supratentorial white matter FLAIR T2 hyperintensities exclusive of the aforementioned abnormalities. No midline shift, mass effect or masses. Ventricles and sulci are normal for patient's age. No abnormal extra-axial fluid collections. VASCULAR: Normal major intracranial vascular flow voids present at skull base. SKULL AND UPPER CERVICAL SPINE: No abnormal sellar expansion. No suspicious calvarial bone marrow signal. Craniocervical junction maintained. SINUSES/ORBITS: The mastoid air-cells and included paranasal sinuses are well-aerated. The included ocular globes and orbital contents are non-suspicious. OTHER: None. IMPRESSION: Acute patchy small to moderate LEFT MCA territory infarct with petechial hemorrhage and small RIGHT nonhemorrhagic MCA territory infarct. Findings concerning for central embolic etiology. 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 Electronically Signed   By: Awilda Metro M.D.   On: 08/31/2016 18:22     12-lead ECG are SR All prior EKG's in EPIC reviewed with no documented atrial fibrillation  Telemetry SR only  Assessment and Plan:  1. Cryptogenic stroke The patient presents with cryptogenic stroke.  The patient has a TEE planned for this AM.  I spoke at length with the  patient about monitoring for afib with either a 30 day event monitor or an implantable loop recorder.  Risks, benefits, and alteratives to implantable loop recorder were discussed with the patient today.   At this time, the patient is very clear in their decision that he would want to proceed with loop recorder, however has significant financial concerns with the monthly monitoring and would like to investigate this first.  I discussed with case management, they will try to help get this information.  Should he like to proceed, will plan for implant later today.   Please call with questions.   Sheilah Pigeon, PA-C 09/02/2016   Reviewed with pt ; seen and examined   Plan TEE and ILR if neg for clot Why did the man have bilateral events

## 2016-09-02 NOTE — PMR Pre-admission (Signed)
PMR Admission Coordinator Pre-Admission Assessment  Patient: Aaron Moon is an 71 y.o., male MRN: [id number] DOB: 05-24-1946 Height: '5\' 7"'  (170.2 cm) Weight: 117.6 kg (259 lb 4.2 oz)              Insurance Information HMO:   PPO:  Yes     PCP:       IPA:       80/20:       OTHER:  Group # [id number] PRIMARY:  [place]       Policy#: [id number]      Subscriber:  [place] CM Name: Jeani Hawking      Phone#:  [phone]     Fax#:  [phone] Pre-Cert#: [id number]      Employer:   Benefits:  Phone #: [phone]     Name:  Dawayne Cirri. Date:  08/21/2000     Deduct:  $0      Out of Pocket Max: $5500 (met $118.65      Life Max:  unlimited CIR: $175 days 1-5, total $875 per admission      SNF:  No benefits Outpatient:  50 visits combined     Co-Pay: $40/visit Home Health: 25 visits      Co-Pay: $30/visit DME: 70%     Co-Pay: 30% Providers: in network  SECONDARY: VA benefits 41%   Policy#:        Subscriber:   CM Name:        Phone#:       Fax#:   Pre-Cert#:        Employer:   Benefits:  Phone #:       Name:   Eff. Date:       Deduct:        Out of Pocket Max:        Life Max:   CIR:        SNF:   Outpatient:       Co-Pay:   Home Health:        Co-Pay:   DME:       Co-Pay:    Emergency Contact Information Contact Information    Name Relation Home Work Mobile   [place] Sister   [phone]     Current Medical History  Patient Admitting Diagnosis: B embolic MCA infarct    History of Present Illness: A 71 y.o. R-handed male with history of gout, obesity, right foot surgery 07/2016--PWB who was admitted on 08/30/16 with sudden onset of right arm weakness and tPA administered at [facility] prior to transfer to [facility].  History taken from chart review and patient. On admission, patient with UE >LE weakness as well as facial weakness. CTA head/neck with moderate proximal L-VA stenosis and mild intracranial atherosclerosis.  MRI brain reviewed, showing patchy LEFT MCA infarct with  petechial hemorrhage and small RIGHT nonhemorrhagic MCA territory infarct concerning for central embolic etiology. CT 1/22, showing b/l L>R infarcts. 2D echo with normal systolic function, no wall abnormalities and grade 1 diastolic dysfunction. Dr. Leonie Man following and recommended ASA and  loop recorder.  TEE done 1/24 revealing small PFO, EF 60-65% and trivial MR.  BLE dopplers ordered   Therapy evaluations done revealing impaired selective attention, limited awareness of deficits and inability to maintain PWB on RLE. CIR recommended for follow up therapy.   Was using a knee walker at home. Has food delivered and hired help with housework once a week. Independent and working prior to surgery 05/2016. Has a nephew in  town.    Total: 4=NIH  Past Medical History  Past Medical History:  Diagnosis Date  . Gout   . Hyperlipidemia   . Obesity     Family History  family history includes ALS in his mother; Pancreatic cancer in his father.  Prior Rehab/Hospitalizations: No previous rehab admissions.  Has the patient had major surgery during 100 days prior to admission? Yes.  He had foot surgery 05/28/16 and 07/30/16 with boot and knee walker in use.  Current Medications   Current Facility-Administered Medications:  .  [COMPLETED] alteplase (ACTIVASE) 1 mg/mL infusion 90 mg, 90 mg, Intravenous, Once **FOLLOWED BY** 0.9 %  sodium chloride infusion, 50 mL, Intravenous, Once, Forde Dandy, MD .  0.9 %  sodium chloride infusion, , Intravenous, Continuous, Greta Doom, MD, Last Rate: 75 mL/hr at 09/01/16 0752 .  acetaminophen (TYLENOL) tablet 650 mg, 650 mg, Oral, Q4H PRN, 650 mg at 09/01/16 2324 **OR** acetaminophen (TYLENOL) solution 650 mg, 650 mg, Per Tube, Q4H PRN **OR** acetaminophen (TYLENOL) suppository 650 mg, 650 mg, Rectal, Q4H PRN, Greta Doom, MD .  albuterol (PROVENTIL) (2.5 MG/3ML) 0.083% nebulizer solution 3 mL, 3 mL, Inhalation, Q6H PRN, Early Chars Rinehuls, PA-C .   aspirin EC tablet 325 mg, 325 mg, Oral, Daily, David L Rinehuls, PA-C, 325 mg at 09/01/16 1213 .  atorvastatin (LIPITOR) tablet 20 mg, 20 mg, Oral, q1800, David L Rinehuls, PA-C, 20 mg at 09/01/16 1810 .  feeding supplement (ENSURE ENLIVE) (ENSURE ENLIVE) liquid 237 mL, 237 mL, Oral, BID BM, Garvin Fila, MD, 237 mL at 09/01/16 1408 .  gentamicin cream (GARAMYCIN) 0.1 %, , Topical, Once per day on Tue Thu Sat, Pramod S Sethi, MD, 1 application at 28/41/32 1711 .  insulin aspart (novoLOG) injection 0-9 Units, 0-9 Units, Subcutaneous, TID WC, David L Rinehuls, PA-C, 1 Units at 09/02/16 0700 .  pantoprazole (PROTONIX) EC tablet 40 mg, 40 mg, Oral, QHS, Garvin Fila, MD, 40 mg at 09/01/16 2324 .  traZODone (DESYREL) tablet 100 mg, 100 mg, Oral, QHS, Wallie Char, 100 mg at 09/01/16 2324  Patients Current Diet: Diet heart healthy/carb modified Room service appropriate? Yes; Fluid consistency: Thin  Precautions / Restrictions Precautions Precautions: Fall Other Brace/Splint: CAM boot RLE; post op shoe on left Restrictions Weight Bearing Restrictions: Yes RLE Weight Bearing: Partial weight bearing Other Position/Activity Restrictions: Reports being able to use it for balance in standing but not to walk on it.   Has the patient had 2 or more falls or a fall with injury in the past year?No  Prior Activity Level Community (5-7x/wk): Went out daily, was driving.  Home Assistive Devices / Equipment Home Assistive Devices/Equipment: None Home Equipment: Walker - 2 wheels, Other (comment), Wheelchair - manual, Shower seat (knee walker)  Prior Device Use: Indicate devices/aids used by the patient prior to current illness, exacerbation or injury? Knee Walker  Prior Functional Level Prior Function Level of Independence: Independent Comments: Uses knee walker for household distances and w/c for community. Was driving until foot surgery in December  Self Care: Did the patient need help  bathing, dressing, using the toilet or eating?  Independent  Indoor Mobility: Did the patient need assistance with walking from room to room (with or without device)? Independent  Stairs: Did the patient need assistance with internal or external stairs (with or without device)? Independent  Functional Cognition: Did the patient need help planning regular tasks such as shopping or remembering to take medications? Independent  Current  Functional Level Cognition  Arousal/Alertness: Awake/alert Overall Cognitive Status: Impaired/Different from baseline Current Attention Level: Sustained Orientation Level: Oriented X4 Following Commands: Follows multi-step commands inconsistently Safety/Judgement: Decreased awareness of safety, Decreased awareness of deficits General Comments: Pt with Rt inattention.  He is unable to follow multi step instruction with distraction.  He demonstrates poor safety awareness, and poor judgement.  pt with inappropriate affect to situation  Attention: Selective Selective Attention: Impaired Selective Attention Impairment: Verbal basic Memory: Impaired Memory Impairment: Decreased recall of new information, Retrieval deficit Awareness: Impaired Awareness Impairment: Intellectual impairment, Emergent impairment, Anticipatory impairment Problem Solving: Impaired Problem Solving Impairment: Verbal basic, Functional basic Safety/Judgment: Impaired    Extremity Assessment (includes Sensation/Coordination)  Upper Extremity Assessment: RUE deficits/detail RUE Deficits / Details: Pt demonstrates shoulder flexion ~110*.  elbow flex/ext [facility] actively.  Mass grasp/release.  He can oppose digit 3.  He demonstrates Rt inattention.  Moveement consistent with Brunnstrom stage V.  He can retrieve items at 100* shoulder elevation with good alignment of shoulder, but demonstrates decreased eccentric control of shoulder and elbow  RUE Sensation: decreased light touch, decreased  proprioception RUE Coordination: decreased fine motor, decreased gross motor  Lower Extremity Assessment: Defer to PT evaluation RLE Deficits / Details: Grossly ~3+/5 throughout, ankle not assessed secondary to CAM boot and surgery. LLE Deficits / Details: Grossly ~3+/5 throughout, ankle not assessed secondary to post op shoe.    ADLs  Overall ADL's : Needs assistance/impaired Eating/Feeding: Set up, Sitting Eating/Feeding Details (indicate cue type and reason): using Lt UE (he is right hand dominant) Grooming: Wash/dry hands, Wash/dry face, Oral care, Brushing hair, Set up, Sitting Grooming Details (indicate cue type and reason): using Lt UE  Upper Body Bathing: Moderate assistance, Sitting Lower Body Bathing: Moderate assistance, Sit to/from stand Upper Body Dressing : Moderate assistance, Sitting Lower Body Dressing: Total assistance, Sit to/from stand Toilet Transfer: Minimal assistance, Stand-pivot, BSC, RW Toilet Transfer Details (indicate cue type and reason): Pt requires step by step cues for hand placement and sequencing.  He fails to place Rt UE on walker, then attempts to proceed one handedly moving RW  Toileting- Water quality scientist and Hygiene: Moderate assistance, Sit to/from stand Functional mobility during ADLs: Minimal assistance, Rolling walker General ADL Comments: Pt unable to maintain WBing status     Mobility  Overal bed mobility: Needs Assistance Bed Mobility: Supine to Sit Supine to sit: Min guard, HOB elevated General bed mobility comments: Pt sitting up in chair     Transfers  Overall transfer level: Needs assistance Equipment used: Rolling walker (2 wheeled) Transfers: Sit to/from Stand, [employer] Transfers Sit to Stand: Min assist Stand pivot transfers: Min assist General transfer comment: Pt requires mod - max cues for proper hand placement and safety.  He requires step by step cues for technique as well as assist to move into standing and for  balance as well as to control Rt UE     Ambulation / Gait / Stairs / Wheelchair Mobility  Ambulation/Gait Ambulation/Gait assistance:  (Non ambulatory at this time due to recenr foot reconstruction RLE and PWB)    Posture / Balance Balance Overall balance assessment: Needs assistance Sitting-balance support: Feet supported, No upper extremity supported Sitting balance-Leahy Scale: Fair Standing balance support: During functional activity, Bilateral upper extremity supported Standing balance-Leahy Scale: Poor Standing balance comment: requires min A     Special needs/care consideration BiPAP/CPAP No CPM No Continuous Drip IV 0.9% NS at 75/ml hr Dialysis No  Life Vest No Oxygen No Special Bed No Trach Size No Wound Vac (area) No     Skin Recent foot surgery and boot                            Bowel mgmt: Last BM 09/02/16 Bladder mgmt: Voiding in bathroom and in urinal Diabetic mgmt Yes, on oral medications at home    Previous Home Environment Living Arrangements: Alone  Lives With: Alone Available Help at Discharge: Family, Available PRN/intermittently (nephew lives across the street) Type of Home: Other(Comment) (condo) Home Layout: One level Home Access: Level entry Bathroom Shower/Tub: Walk-in Radio producer: [place]: Yes Type of Home Care Services: Home RN  Discharge Living Setting Plans for Discharge Living Setting: Alone, Other (Comment) (Lives in a condominium.) Type of Home at Discharge: Other (Comment) (Condominium) Discharge Home Layout: One level Discharge Home Access: Level entry Does the patient have any problems obtaining your medications?: No  Social/Family/Support Systems Patient Roles: Other (Comment) (Has a nephew, a sister and friends.) Contact Information: Lawerance Bach - sister -  Anticipated Caregiver: self and nephew across the street, [employer]. Ability/Limitations of Caregiver: Alanson Puls works days and sister  lives in [place], [place] Caregiver Availability: Intermittent Discharge Plan Discussed with Primary Caregiver: Yes Is Caregiver In Agreement with Plan?: Yes Does Caregiver/Family have Issues with Lodging/Transportation while Pt is in Rehab?: No  Goals/Additional Needs Patient/Family Goal for Rehab: PT/OT/SLP mod I goals Expected length of stay: 14-17 days Cultural Considerations: None Dietary Needs: Carb mod, med cal, thin liquids Equipment Needs: TBD Pt/Family Agrees to Admission and willing to participate: Yes Program Orientation Provided & Reviewed with Pt/Caregiver Including Roles  & Responsibilities: Yes  Decrease burden of Care through IP rehab admission: N/A  Possible need for SNF placement upon discharge:  Has no SNF benefits with fed BCBS.  However, has 80% VA benefits per patient.  Not anticipating SNF.  Patient Condition: This patient's condition remains as documented in the consult dated 09/01/16, in which the Rehabilitation Physician determined and documented that the patient's condition is appropriate for intensive rehabilitative care in an inpatient rehabilitation facility. Will admit to inpatient rehab today.  Preadmission Screen Completed By:  [name], 09/02/2016 1:10 PM ______________________________________________________________________   Discussed status with Dr. Posey Pronto on 09/02/16 at 1254 and received telephone approval for admission today.  Admission Coordinator:  [name], time1310/Date01/24/18

## 2016-09-02 NOTE — Progress Notes (Signed)
Patient called RN to room with complaints of loop recorder site bleeding through dressing.  Bright red blood noted to be coming from dressing.  Applied gentle pressure and reinforced dressing.  Staff from OR present to do education with the patient regarding the loop recorder.  Notified her and she recommended just holding pressure.  Notified Deatra Inaan Angiulli, PA also, who recommended monitoring and restricting strenuous activity until assessed by MD in the morning.  Dani Gobbleeardon, Paxtyn Boyar J, RN
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
## 2016-09-02 NOTE — Progress Notes (Signed)
HS blood sugar 145.   Aaron Moon, Aaron Moon M

## 2016-09-02 NOTE — Care Management Note (Addendum)
Case Management Note  Patient Details  Name: Aaron Moon MRN: [id number] Date of Birth: 1945-09-17  Subjective/Objective:    [facility]. Ad[facility] with [facility] aware of his hospitalization and plan for CIR at discharge.              Action/Plan: Pt discharging to CIR today. No further needs per CM.   Expected Discharge Date:  09/02/16               Expected Discharge Plan:  IP Rehab Facility  In-House Referral:     Discharge planning Services     Post Acute Care Choice:    Choice offered to:     DME Arranged:    DME Agency:     HH Arranged:    HH Agency:     Status of Service:  Completed, signed off  If discussed at [employer] Length of [employer]Stay Meetings, dates discussed:    Additional Comments:  Kermit BaloKelli F Christon Parada, RN 09/02/2016, 1:06 PM

## 2016-09-02 NOTE — H&P (View-Only) (Signed)
  ELECTROPHYSIOLOGY CONSULT NOTE  Patient ID: Aaron Moon MRN: [id number], DOB/AGE: 03/25/1946   Admit date: 08/30/2016 Date of Consult: 09/02/2016  Primary Physician: Pcp Not In System Primary Cardiologist: none Reason for Consultation: Cryptogenic stroke ; recommendations regarding Implantable Loop Recorder Requesting MD: Dr. Sethi  History of Present Illness Aaron Moon was admitted on 08/30/2016 with CVA.  He first developed symptoms while at home R hand weakness, then arm, leg and face symptoms as well transferred from [facility] to [facility].  He received tPA.  PMHx only for gout, obesity and HLD.   Imaging demonstrated Acute patchy small to moderate LEFT MCA territory infarct with petechial hemorrhage and small RIGHT nonhemorrhagic MCA territory infarct. Findings concerning for central embolic etiology. Mild chronic small vessel ischemic disease and old thalamus infarcts..  he has undergone workup for stroke including echocardiogram and carotid angio.  The patient has been monitored on telemetry which has demonstrated sinus rhythm with no arrhythmias.  Inpatient stroke work-up is to be completed with a TEE this afternoon.   Echocardiogram this admission demonstrated  Study Conclusions - Left ventricle: The cavity size was normal. There was mild   concentric hypertrophy. Systolic function was normal. Wall motion   was normal; there were no regional wall motion abnormalities.   Doppler parameters are consistent with abnormal left ventricular   relaxation (grade 1 diastolic dysfunction). - Aortic valve: Trileaflet; normal thickness leaflets. There was   mild regurgitation. - Aortic root: The aortic root was normal in size. - Mitral valve: Structurally normal valve. There was no   regurgitation. - Right ventricle: The cavity size was normal. Wall thickness was   normal. Systolic function was normal. - Right atrium: The atrium was normal in size. - Tricuspid valve: There was no regurgitation. -  Pulmonic valve: There was no regurgitation. - Pulmonary arteries: Systolic pressure was within the normal   range. - Inferior vena cava: The vessel was normal in size. - Pericardium, extracardiac: There was no pericardial effusion.   Lab work is reviewed.   Prior to admission, the patient denies chest pain, shortness of breath, dizziness, palpitations, or syncope.  They are recovering from their stroke with plans to CIR at discharge.  EP has been asked to evaluate for placement of an implantable loop recorder to monitor for atrial fibrillation.     Past Medical History:  Diagnosis Date  . Gout   . Hyperlipidemia   . Obesity      Surgical History:  Past Surgical History:  Procedure Laterality Date  . ABDOMINAL SURGERY     colostomy & colostomy reversal  . ANKLE SURGERY       Prescriptions Prior to Admission  Medication Sig Dispense Refill Last Dose  . gentamicin cream (GARAMYCIN) 0.1 % Apply 1 application topically 3 (three) times daily. (Patient taking differently: Apply 1 application topically daily as needed (wound care/dressing change). ) 30 g 1 08/29/2016 at Unknown time  . meloxicam (MOBIC) 15 MG tablet Take 15 mg by mouth daily.  1 08/30/2016 at Unknown time  . omeprazole (PRILOSEC) 20 MG capsule Take 20 mg by mouth daily as needed (acid reflux).    08/29/2016 at pm  . oxyCODONE-acetaminophen (ROXICET) 5-325 MG tablet Take 1 tablet by mouth every 8 (eight) hours as needed for severe pain. 30 tablet 0 08/30/2016 at 1500  . traZODone (DESYREL) 100 MG tablet Take 100 mg by mouth at bedtime.   08/29/2016 at Unknown time  . albuterol (PROVENTIL HFA;VENTOLIN HFA) 108 (90 BASE)   MCG/ACT inhaler Inhale 2 puffs into the lungs every 6 (six) hours as needed for wheezing. (Patient not taking: Reported on 08/30/2016) 1 Inhaler 0 Not Taking at Unknown time  . ALPRAZolam (XANAX) 0.5 MG tablet Take 1 tablet (0.5 mg total) by mouth at bedtime as needed. (Patient not taking: Reported on  08/30/2016) 90 tablet 0 Not Taking at Unknown time  . oxyCODONE-acetaminophen (ROXICET) 5-325 MG tablet Take 1 tablet by mouth every 8 (eight) hours as needed for severe pain. (Patient not taking: Reported on 08/30/2016) 30 tablet 0 Not Taking at Unknown time    Inpatient Medications:  . sodium chloride  50 mL Intravenous Once  . aspirin EC  325 mg Oral Daily  . atorvastatin  20 mg Oral q1800  . feeding supplement (ENSURE ENLIVE)  237 mL Oral BID BM  . gentamicin cream   Topical Once per day on Tue Thu Sat  . insulin aspart  0-9 Units Subcutaneous TID WC  . pantoprazole  40 mg Oral QHS  . traZODone  100 mg Oral QHS    Allergies: No Known Allergies  Social History   Social History  . Marital status: Single    Spouse name: N/A  . Number of children: N/A  . Years of education: N/A   Occupational History  . Not on file.   Social History Main Topics  . Smoking status: Former Smoker  . Smokeless tobacco: Never Used  . Alcohol use Yes  . Drug use: No  . Sexual activity: No   Other Topics Concern  . Not on file   Social History Narrative  . No narrative on file     Family History  Problem Relation Age of Onset  . ALS Mother   . Pancreatic cancer Father       Review of Systems: All other systems reviewed and are otherwise negative except as noted above.  Physical Exam: Vitals:   09/01/16 1817 09/01/16 2139 09/02/16 0058 09/02/16 0437  BP: (!) 165/95 (!) 155/94 (!) 143/69 120/60  Pulse: 77 (!) 59 (!) 59 60  Resp: 20 20 20 18  Temp: 98.5 F (36.9 C) 98 F (36.7 C) 98.6 F (37 C) 98.6 F (37 C)  TempSrc: Oral Oral Oral Oral  SpO2: 97% 98% 97% 96%  Weight:      Height:        GEN- The patient is obese, well appearing, alert and oriented x 3 today.   Head- normocephalic, atraumatic Eyes-  Sclera clear, conjunctiva pink Ears- hearing intact Oropharynx- clear Neck- supple Lungs- Clear to ausculation bilaterally, normal work of breathing Heart- Regular rate  and rhythm, no murmurs, rubs or gallops  GI- soft, NT, ND Extremities- no clubbing, cyanosis, or edema MS- no significant deformity or atrophy Skin- no rash or lesion Psych- euthymic mood, full affect   Labs:   Lab Results  Component Value Date   WBC 11.8 (H) 08/30/2016   HGB 13.8 08/30/2016   HCT 41.8 08/30/2016   MCV 85.5 08/30/2016   PLT 257 08/30/2016    Recent Labs Lab 08/30/16 1845 08/31/16 0319  NA 136 139  K 5.3* 4.2  CL 106 108  CO2 21* 21*  BUN 12 8  CREATININE 1.07 0.98  CALCIUM 8.9 9.0  PROT 7.3  --   BILITOT 1.0  --   ALKPHOS 61  --   ALT 38  --   AST 48*  --   GLUCOSE 122* 130*   No results found for:   CKTOTAL, CKMB, CKMBINDEX, TROPONINI Lab Results  Component Value Date   CHOL 179 08/31/2016   CHOL 165 07/22/2015   CHOL 165 02/19/2014   Lab Results  Component Value Date   HDL 30 (L) 08/31/2016   HDL 33 (L) 07/22/2015   HDL 32 (L) 02/19/2014   Lab Results  Component Value Date   LDLCALC 91 08/31/2016   LDLCALC 75 07/22/2015   LDLCALC 69 02/19/2014   Lab Results  Component Value Date   TRIG 291 (H) 08/31/2016   TRIG 283 (H) 07/22/2015   TRIG 321 (H) 02/19/2014   Lab Results  Component Value Date   CHOLHDL 6.0 08/31/2016   CHOLHDL 5.0 07/22/2015   CHOLHDL 5.2 02/19/2014   No results found for: LDLDIRECT  No results found for: DDIMER   Radiology/Studies:  Ct Angio Head W Or Wo Contrast Result Date: 08/30/2016 CLINICAL DATA:  Right arm weakness.  Status post tPA. EXAM: CT ANGIOGRAPHY HEAD AND NECK TECHNIQUE: Multidetector CT imaging of the head and neck was performed using the standard protocol during bolus administration of intravenous contrast. Multiplanar CT image reconstructions and MIPs were obtained to evaluate the vascular anatomy. Carotid stenosis measurements (when applicable) are obtained utilizing NASCET criteria, using the distal internal carotid diameter as the denominator. CONTRAST:  50 mL Isovue 370 COMPARISON:  None.  FINDINGS: CTA NECK FINDINGS Aortic arch: Normal variant aortic arch branching pattern with common origin of the brachiocephalic and left common carotid arteries. Mild aortic arch atherosclerosis. Mild non stenotic plaque at the left subclavian origin. Right carotid system: Medial course of the common carotid artery. Small volume noncalcified plaque in the proximal ICA with moderate luminal irregularity but no significant stenosis. Left carotid system: Medial course of the common carotid artery. Relatively small volume mixed calcified and noncalcified plaque involving the carotid bifurcation without significant stenosis. Vertebral arteries: Patent and codominant. Moderate proximal left P1 stenosis. Skeleton: Advanced disc degeneration C4-5, C5-6, and C6-7. Slight anterolisthesis of C7 on T1 with underlying facet arthrosis. Other neck: No mass or lymph node enlargement. Upper chest: Clear lung apices. Review of the MIP images confirms the above findings CTA HEAD FINDINGS Anterior circulation: The internal carotid arteries are patent from skullbase to carotid termini. There is mild-to-moderate siphon atherosclerosis bilaterally without significant stenosis. An infundibulum is noted at the left posterior communicating origin. ACAs and MCAs are patent without evidence of major branch occlusion or significant proximal stenosis. No aneurysm. Posterior circulation: Intracranial vertebral arteries are widely patent to the basilar. Dominant left PICA and right AICA. SCA origins are patent. Basilar artery is widely patent. There are patent posterior communicating arteries bilaterally, right larger than left. The right P1 segment is hypoplastic. No significant PCA stenosis is seen. No aneurysm. Venous sinuses: Grossly patent within limitations of arterial phase dominant contrast timing. Anatomic variants: None of significance. Review of the MIP images confirms the above findings IMPRESSION: 1. Mild atherosclerosis at the  carotid bifurcations in the neck without stenosis. 2. Moderate proximal left vertebral artery stenosis. 3. Mild intracranial atherosclerosis without major vessel occlusion or significant proximal stenosis. Electronically Signed   By: Allen  Grady M.D.   On: 08/30/2016 21:18   Ct Head Wo Contrast Result Date: 09/01/2016 CLINICAL DATA:  Follow-up exam status post tPA. EXAM: CT HEAD WITHOUT CONTRAST TECHNIQUE: Contiguous axial images were obtained from the base of the skull through the vertex without intravenous contrast. COMPARISON:  Prior CT 08/30/2016 and MRI from 08/31/2016. FINDINGS: Brain: Stable atrophy with mild chronic small vessel   ischemic disease. Evolving patchy acute bilateral MCA territory infarcts again seen, left greater than right. Overall, distribution similar nerve previous MRI. No significant mass effect. Few scattered subtle minimally hyperdense areas within the area of infarction likely reflect the previously seen petechial hemorrhage. No evidence for frank hemorrhagic transformation or other complication. No other acute large vessel territory infarct identified. No mass lesion, midline shift or mass effect. No hydrocephalus. No extra-axial fluid collection. Vascular: Vascular calcifications noted within the carotid siphons. No hyperdense vessel. Skull: Scalp soft tissues demonstrate no acute abnormality. Calvarium intact. Sinuses/Orbits: Globes orbits within normal limits. Paranasal sinuses are clear. No mastoid effusion. IMPRESSION: 1. Similar size and distribution of acute patchy small to moderate bilateral MCA territory infarcts, left greater than right. No evidence for hemorrhagic transformation or other complication status post tPA. 2. No other new acute intracranial process. Electronically Signed   By: Benjamin  McClintock M.D.   On: 09/01/2016 01:33   Mr Brain Wo Contrast Result Date: 08/31/2016 CLINICAL DATA:  Acute onset RIGHT hand weakness while watching television tonight. Status  post tPA. History of hyperlipidemia. EXAM: MRI HEAD WITHOUT CONTRAST TECHNIQUE: Multiplanar, multiecho pulse sequences of the brain and surrounding structures were obtained without intravenous contrast. COMPARISON:  CTA HEAD and CTA HEAD and neck August 30, 2016 FINDINGS: BRAIN: Patchy reduced diffusion LEFT greater than RIGHT parietal lobes reduced diffusion. Scattered LEFT frontal and LEFT basal ganglia foci of reduced diffusion. Corresponding low ADC values and patchy susceptibility artifact LEFT parietal lobe. Chronic micro hemorrhages central pons and RIGHT thalamus associated with chronic hypertension/infarcts. Old bilateral thalamus lacunar infarcts. Scattered subcentimeter supratentorial white matter FLAIR T2 hyperintensities exclusive of the aforementioned abnormalities. No midline shift, mass effect or masses. Ventricles and sulci are normal for patient's age. No abnormal extra-axial fluid collections. VASCULAR: Normal major intracranial vascular flow voids present at skull base. SKULL AND UPPER CERVICAL SPINE: No abnormal sellar expansion. No suspicious calvarial bone marrow signal. Craniocervical junction maintained. SINUSES/ORBITS: The mastoid air-cells and included paranasal sinuses are well-aerated. The included ocular globes and orbital contents are non-suspicious. OTHER: None. IMPRESSION: Acute patchy small to moderate LEFT MCA territory infarct with petechial hemorrhage and small RIGHT nonhemorrhagic MCA territory infarct. Findings concerning for central embolic etiology. 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 Electronically Signed   By: Courtnay  Bloomer M.D.   On: 08/31/2016 18:22     12-lead ECG are SR All prior EKG's in EPIC reviewed with no documented atrial fibrillation  Telemetry SR only  Assessment and Plan:  1. Cryptogenic stroke The patient presents with cryptogenic stroke.  The patient has a TEE planned for this AM.  I spoke at length with the  patient about monitoring for afib with either a 30 day event monitor or an implantable loop recorder.  Risks, benefits, and alteratives to implantable loop recorder were discussed with the patient today.   At this time, the patient is very clear in their decision that he would want to proceed with loop recorder, however has significant financial concerns with the monthly monitoring and would like to investigate this first.  I discussed with case management, they will try to help get this information.  Should he like to proceed, will plan for implant later today.   Please call with questions.   Renee Lynn Ursuy, PA-C 09/02/2016   Reviewed with pt ; seen and examined   Plan TEE and ILR if neg for clot Why did the man have bilateral events  

## 2016-09-02 NOTE — CV Procedure (Signed)
Procedure: TEE  Indication: CVA  Sedation: Versed 2 mg IV, 25 mcg IV.    Procedure: Please see echo section for full report.  Normal LV size with mild LV hypertrophy.  EF 60-65%, normal wall motion.  Normal RV size and systolic function.  Normal left and right atrial sizes, no LA appendage thrombus.  There was a small PFO noted by bubble study.  No significant tricuspid regurgitation.  Trivial mitral regurgitation.  Trileaflet aortic valve with no stenosis, mild regurgitation.  Normal caliber aorta with minimal plaque. ]  Only potential source of embolus was a small PFO.  Will order lower extremity venous dopplers.   Aaron AnconaDalton Malcomb Moon 09/02/2016 12:25 PM

## 2016-09-03 ENCOUNTER — Inpatient Hospital Stay (HOSPITAL_COMMUNITY): Payer: Federal, State, Local not specified - PPO | Admitting: Physical Therapy

## 2016-09-03 ENCOUNTER — Inpatient Hospital Stay (HOSPITAL_COMMUNITY): Payer: Federal, State, Local not specified - PPO | Admitting: Occupational Therapy

## 2016-09-03 ENCOUNTER — Inpatient Hospital Stay (HOSPITAL_COMMUNITY): Payer: Federal, State, Local not specified - PPO | Admitting: Speech Pathology

## 2016-09-03 ENCOUNTER — Encounter (HOSPITAL_COMMUNITY): Payer: Self-pay | Admitting: Internal Medicine

## 2016-09-03 DIAGNOSIS — E46 Unspecified protein-calorie malnutrition: Secondary | ICD-10-CM

## 2016-09-03 DIAGNOSIS — Z95818 Presence of other cardiac implants and grafts: Secondary | ICD-10-CM

## 2016-09-03 DIAGNOSIS — E8809 Other disorders of plasma-protein metabolism, not elsewhere classified: Secondary | ICD-10-CM

## 2016-09-03 DIAGNOSIS — E1159 Type 2 diabetes mellitus with other circulatory complications: Secondary | ICD-10-CM

## 2016-09-03 DIAGNOSIS — I69393 Ataxia following cerebral infarction: Secondary | ICD-10-CM

## 2016-09-03 DIAGNOSIS — I63512 Cerebral infarction due to unspecified occlusion or stenosis of left middle cerebral artery: Secondary | ICD-10-CM

## 2016-09-03 DIAGNOSIS — Z9889 Other specified postprocedural states: Secondary | ICD-10-CM

## 2016-09-03 DIAGNOSIS — F5101 Primary insomnia: Secondary | ICD-10-CM

## 2016-09-03 DIAGNOSIS — I639 Cerebral infarction, unspecified: Secondary | ICD-10-CM

## 2016-09-03 DIAGNOSIS — R0989 Other specified symptoms and signs involving the circulatory and respiratory systems: Secondary | ICD-10-CM

## 2016-09-03 DIAGNOSIS — D62 Acute posthemorrhagic anemia: Secondary | ICD-10-CM

## 2016-09-03 LAB — GLUCOSE, CAPILLARY
GLUCOSE-CAPILLARY: 136 mg/dL — AB (ref 65–99)
GLUCOSE-CAPILLARY: 139 mg/dL — AB (ref 65–99)
GLUCOSE-CAPILLARY: 122 mg/dL — AB (ref 65–99)
Glucose-Capillary: 145 mg/dL — ABNORMAL HIGH (ref 65–99)

## 2016-09-03 LAB — CBC WITH DIFFERENTIAL/PLATELET
BASOS PCT: 0 %
Basophils Absolute: 0 10*3/uL (ref 0.0–0.1)
Eosinophils Absolute: 0.3 10*3/uL (ref 0.0–0.7)
Eosinophils Relative: 3 %
HCT: 37.4 % — ABNORMAL LOW (ref 39.0–52.0)
HEMOGLOBIN: 12 g/dL — AB (ref 13.0–17.0)
Lymphocytes Relative: 32 %
Lymphs Abs: 3 10*3/uL (ref 0.7–4.0)
MCH: 28.1 pg (ref 26.0–34.0)
MCHC: 32.1 g/dL (ref 30.0–36.0)
MCV: 87.6 fL (ref 78.0–100.0)
Monocytes Absolute: 0.8 10*3/uL (ref 0.1–1.0)
Monocytes Relative: 8 %
NEUTROS PCT: 57 %
Neutro Abs: 5.3 10*3/uL (ref 1.7–7.7)
PLATELETS: 233 10*3/uL (ref 150–400)
RBC: 4.27 MIL/uL (ref 4.22–5.81)
RDW: 14 % (ref 11.5–15.5)
WBC: 9.4 10*3/uL (ref 4.0–10.5)

## 2016-09-03 LAB — COMPREHENSIVE METABOLIC PANEL
ALK PHOS: 46 U/L (ref 38–126)
ALT: 33 U/L (ref 17–63)
AST: 38 U/L (ref 15–41)
Albumin: 3 g/dL — ABNORMAL LOW (ref 3.5–5.0)
Anion gap: 13 (ref 5–15)
BILIRUBIN TOTAL: 0.6 mg/dL (ref 0.3–1.2)
BUN: 13 mg/dL (ref 6–20)
CALCIUM: 8.5 mg/dL — AB (ref 8.9–10.3)
CO2: 21 mmol/L — ABNORMAL LOW (ref 22–32)
CREATININE: 1.19 mg/dL (ref 0.61–1.24)
Chloride: 102 mmol/L (ref 101–111)
GFR calc Af Amer: >60 mL/min (ref >60)
Glucose, Bld: 175 mg/dL — ABNORMAL HIGH (ref 65–99)
Potassium: 4 mmol/L (ref 3.5–5.1)
Sodium: 136 mmol/L (ref 135–145)
TOTAL PROTEIN: 5.6 g/dL — AB (ref 6.5–8.1)

## 2016-09-03 MED ORDER — SODIUM CHLORIDE 0.9 % IV BOLUS (SEPSIS)
500.0000 mL | Freq: Once | INTRAVENOUS | Status: AC | PRN
  Administered 2016-09-03: 500 mL via INTRAVENOUS

## 2016-09-03 MED ORDER — SODIUM CHLORIDE 0.9 % IV SOLN
Freq: Once | INTRAVENOUS | Status: AC
  Administered 2016-09-03: 02:00:00 via INTRAVENOUS

## 2016-09-03 MED ORDER — GABAPENTIN 100 MG PO CAPS
100.0000 mg | ORAL_CAPSULE | Freq: Three times a day (TID) | ORAL | Status: DC
  Administered 2016-09-03 – 2016-09-12 (×29): 100 mg via ORAL
  Filled 2016-09-03 (×29): qty 1

## 2016-09-03 MED ORDER — PRO-STAT SUGAR FREE PO LIQD
30.0000 mL | Freq: Two times a day (BID) | ORAL | Status: DC
  Administered 2016-09-04 – 2016-09-17 (×27): 30 mL via ORAL
  Filled 2016-09-03 (×28): qty 30

## 2016-09-03 MED ORDER — HYDROCERIN EX CREA
TOPICAL_CREAM | CUTANEOUS | Status: DC
  Administered 2016-09-05 – 2016-09-15 (×5): via TOPICAL
  Filled 2016-09-03: qty 113

## 2016-09-03 NOTE — Progress Notes (Signed)
Dressing to left chest remains clean, dry and intact.  No signs of bleeding present.  Patient resting comfortably.  Deatra Inaan Angiulli, PA, notified of events.  No further orders received.   Kelli HopeBarber, Corneshia Hines M

## 2016-09-03 NOTE — Care Management Note (Signed)
[facility] Individual Statement of Services  Patient Name:  Aaron Moon  Date:  09/03/2016  Welcome to the [facility].  Our goal is to provide you with an individualized program based on your diagnosis and situation, designed to meet your specific needs.  With this comprehensive rehabilitation program, you will be expected to participate in at least 3 hours of rehabilitation therapies Monday-Friday, with modified therapy programming on the weekends.  Your rehabilitation program will include the following services:  Physical Therapy (PT), Occupational Therapy (OT), Speech Therapy (ST), 24 hour per day rehabilitation nursing, Therapeutic Recreaction (TR), Neuropsychology, Case Management (Social Worker), Rehabilitation Medicine, Nutrition Services and Pharmacy Services  Weekly team conferences will be held on Wednesday to discuss your progress.  Your Social Worker will talk with you frequently to get your input and to update you on team discussions.  Team conferences with you and your family in attendance may also be held.  Expected length of stay: 14-16 days  Overall anticipated outcome: supervision with cueing  Depending on your progress and recovery, your program may change. Your Social Worker will coordinate services and will keep you informed of any changes. Your Social Worker's name and contact numbers are listed  below.  The following services may also be recommended but are not provided by the [facility]:   Driving Evaluations  Home Health Rehabiltiation Services  Outpatient Rehabilitation Services  Vocational Rehabilitation   Arrangements will be made to provide these services after discharge if needed.  Arrangements include referral to agencies that provide these services.  Your insurance has been verified to be:  [employer] [place] [facility]  [facility] [place]  Pertinent information will be shared with  your doctor and your insurance company.  Social Worker:  Dossie DerBecky Lannie Yusuf, SW [phone] or [phone]  Information discussed with and copy given to patient by: Lucy Chrisupree, [name], 09/03/2016, 1:12 PM

## 2016-09-03 NOTE — Progress Notes (Signed)
Aaron Karis Juba, MD Physician Signed Physical Medicine and Rehabilitation  Consult Note Date of Service: 09/01/2016 8:36 AM  Related encounter: ED to Hosp-Admission (Discharged) from 08/30/2016 in [facility] 45M NEURO MEDICAL     Expand All Collapse All   [] Hide copied text      Physical Medicine and Rehabilitation Consult  Reason for Consult: RUE > RLE  weakness, right facial weakness Referring Physician: Dr. Pearlean Brownie.    HPI: Aaron Moon is a 71 y.o. R-handed male with history of gout, obesity, right foot surgery 07/2016--PWB who was admitted on 08/30/16 with sudden onset of right arm weakness and tPA administered at [facility] prior to transfer to [facility].  History taken from chart review and patient. On admission, patient with UE >LE weakness as well as facial weakness. CTA head/neck with moderate proximal L-VA stenosis and mild intracranial atherosclerosis.  MRI brain reviewed, showing patchy LEFT MCA infarct with petechial hemorrhage and small RIGHT nonhemorrhagic MCA territory infarct concerning for central embolic etiology. CT 1/22, showing b/l L>R infarcts. 2D echo with normal systolic function, no wall abnormalities and grade 1 diastolic dysfunction. Therapy evaluations done revealing impaired selective attention, limited awareness of deficits and inability to maintain PWB on RLE. CIR recommended for follow up therapy.   Was using a knee walker at home. Has food delivered and hired help with housework once a week. Independent and working prior to surgery 05/2016. Has a nephew in town.   Review of Systems  HENT: Negative for hearing loss and tinnitus.   Eyes: Negative for blurred vision and photophobia.  Respiratory: Negative for cough and shortness of breath.   Cardiovascular: Negative for chest pain and palpitations.  Gastrointestinal: Positive for heartburn. Negative for constipation and nausea.  Genitourinary: Negative for dysuria and urgency.       Nocturia X 2    Musculoskeletal: Negative for back pain and myalgias.  Skin: Negative for itching and rash.  Neurological: Positive for focal weakness. Negative for dizziness and headaches.  Psychiatric/Behavioral: Memory loss:   The patient has insomnia.   All other systems reviewed and are negative.         Past Medical History:  Diagnosis Date  . Gout   . Hyperlipidemia   . Obesity          Past Surgical History:  Procedure Laterality Date  . ABDOMINAL SURGERY     colostomy & colostomy reversal  . ANKLE SURGERY           Family History  Problem Relation Age of Onset  . ALS Mother   . Pancreatic cancer Father       Social History:  Single.  Has been out of work since surgery 05/2016. He reports that he has quit smoking 8 months.  He has never used smokeless tobacco. He reports that he drinks alcohol. He reports that he does not use drugs.    Allergies: No Known Allergies          Medications Prior to Admission  Medication Sig Dispense Refill  . gentamicin cream (GARAMYCIN) 0.1 % Apply 1 application topically 3 (three) times daily. (Patient taking differently: Apply 1 application topically daily as needed (wound care/dressing change). ) 30 g 1  . meloxicam (MOBIC) 15 MG tablet Take 15 mg by mouth daily.  1  . omeprazole (PRILOSEC) 20 MG capsule Take 20 mg by mouth daily as needed (acid reflux).     Marland Kitchen oxyCODONE-acetaminophen (ROXICET) 5-325 MG tablet Take 1 tablet by mouth  every 8 (eight) hours as needed for severe pain. 30 tablet 0  . traZODone (DESYREL) 100 MG tablet Take 100 mg by mouth at bedtime.    Marland Kitchen albuterol (PROVENTIL HFA;VENTOLIN HFA) 108 (90 BASE) MCG/ACT inhaler Inhale 2 puffs into the lungs every 6 (six) hours as needed for wheezing. (Patient not taking: Reported on 08/30/2016) 1 Inhaler 0  . ALPRAZolam (XANAX) 0.5 MG tablet Take 1 tablet (0.5 mg total) by mouth at bedtime as needed. (Patient not taking: Reported on 08/30/2016) 90 tablet 0  .  oxyCODONE-acetaminophen (ROXICET) 5-325 MG tablet Take 1 tablet by mouth every 8 (eight) hours as needed for severe pain. (Patient not taking: Reported on 08/30/2016) 30 tablet 0    Home: Home Living Family/patient expects to be discharged to:: Private residence Living Arrangements: Alone Available Help at Discharge: Family, Available PRN/intermittently (nephew lives across the street) Type of Home: Other(Comment) (condo) Home Access: Level entry Home Layout: One level Bathroom Shower/Tub: Walk-in Stage manager: Standard Home Equipment: Environmental consultant - 2 wheels, Other (comment), Wheelchair - manual, Shower seat (knee walker)  Lives With: Alone  Functional History: Prior Function Level of Independence: Independent Comments: Uses knee walker for household distances and w/c for community. Was driving until foot surgery in December Functional Status:  Mobility: Bed Mobility Overal bed mobility: Needs Assistance Bed Mobility: Supine to Sit Supine to sit: Min guard, HOB elevated General bed mobility comments: Pt sitting up in chair  Transfers Overall transfer level: Needs assistance Equipment used: Rolling walker (2 wheeled) Transfers: Sit to/from Stand, [employer] Transfers Sit to Stand: Min assist Stand pivot transfers: Min assist General transfer comment: Pt requires mod - max cues for proper hand placement and safety.  He requires step by step cues for technique as well as assist to move into standing and for balance as well as to control Rt UE  Ambulation/Gait Ambulation/Gait assistance:  (Non ambulatory at this time due to recenr foot reconstruction RLE and PWB)  ADL: ADL Overall ADL's : Needs assistance/impaired Eating/Feeding: Set up, Sitting Eating/Feeding Details (indicate cue type and reason): using Lt UE (he is right hand dominant) Grooming: Wash/dry hands, Wash/dry face, Oral care, Brushing hair, Set up, Sitting Grooming Details (indicate cue type and reason):  using Lt UE  Upper Body Bathing: Moderate assistance, Sitting Lower Body Bathing: Moderate assistance, Sit to/from stand Upper Body Dressing : Moderate assistance, Sitting Lower Body Dressing: Total assistance, Sit to/from stand Toilet Transfer: Minimal assistance, Stand-pivot, BSC, RW Toilet Transfer Details (indicate cue type and reason): Pt requires step by step cues for hand placement and sequencing.  He fails to place Rt UE on walker, then attempts to proceed one handedly moving RW  Toileting- Architect and Hygiene: Moderate assistance, Sit to/from stand Functional mobility during ADLs: Minimal assistance, Rolling walker General ADL Comments: Pt unable to maintain WBing status   Cognition: Cognition Overall Cognitive Status: Impaired/Different from baseline Arousal/Alertness: Awake/alert Orientation Level: Oriented X4 Attention: Selective Selective Attention: Impaired Selective Attention Impairment: Verbal basic Memory: Impaired Memory Impairment: Decreased recall of new information, Retrieval deficit Awareness: Impaired Awareness Impairment: Intellectual impairment, Emergent impairment, Anticipatory impairment Problem Solving: Impaired Problem Solving Impairment: Verbal basic, Functional basic Safety/Judgment: Impaired Cognition Arousal/Alertness: Awake/alert Behavior During Therapy: Impulsive Overall Cognitive Status: Impaired/Different from baseline Area of Impairment: Attention, Following commands, Safety/judgement, Awareness, Problem solving Current Attention Level: Sustained Following Commands: Follows multi-step commands inconsistently Safety/Judgement: Decreased awareness of safety, Decreased awareness of deficits Awareness: Intellectual Problem Solving: Difficulty sequencing, Requires verbal cues, Requires  tactile cues General Comments: Pt with Rt inattention.  He is unable to follow multi step instruction with distraction.  He demonstrates poor safety  awareness, and poor judgement.  pt with inappropriate affect to situation   Blood pressure (!) 150/87, pulse 70, temperature 97.6 F (36.4 C), temperature source Oral, resp. rate 18, height 5\' 7"  (1.702 m), weight 117.6 kg (259 lb 4.2 oz), SpO2 96 %. Physical Exam  Vitals reviewed. Constitutional: He is oriented to person, place, and time. He appears well-developed and well-nourished. No distress.  Obese  HENT:  Head: Normocephalic and atraumatic.  Mouth/Throat: Oropharynx is clear and moist.  Eyes: Conjunctivae and EOM are normal. Pupils are equal, round, and reactive to light.  Neck: Normal range of motion. Neck supple.  Respiratory: Effort normal and breath sounds normal. No stridor. No respiratory distress. He has no wheezes.  GI: Soft. Bowel sounds are normal. He exhibits distension. There is no tenderness.  Musculoskeletal: He exhibits no edema or tenderness.  CAM boot on right foot.   Neurological: He is alert and oriented to person, place, and time.  Mild right facial weakness.  Speech clear.  Distracted and needed cues to attend to task.  Able to follow simple motor commands.  DTRs symmetric Motor: RUE: 4-/5 proximal to distal RLE: 4+/5 proximally, boot distally LUE: 4/5 proximal to distal LLE: 5/5 proximal to distal Right sided significant dysmetria and ataxia  Skin: Skin is warm and dry. He is not diaphoretic.  Psychiatric: He has a normal mood and affect. His behavior is normal.    Lab Results Last 24 Hours  No results found for this or any previous visit (from the past 24 hour(s)).    Imaging Results (Last 48 hours)  Ct Angio Head W Or Wo Contrast  Result Date: 08/30/2016 CLINICAL DATA:  Right arm weakness.  Status post tPA. EXAM: CT ANGIOGRAPHY HEAD AND NECK TECHNIQUE: Multidetector CT imaging of the head and neck was performed using the standard protocol during bolus administration of intravenous contrast. Multiplanar CT image reconstructions and MIPs were  obtained to evaluate the vascular anatomy. Carotid stenosis measurements (when applicable) are obtained utilizing NASCET criteria, using the distal internal carotid diameter as the denominator. CONTRAST:  50 mL Isovue 370 COMPARISON:  None. FINDINGS: CTA NECK FINDINGS Aortic arch: Normal variant aortic arch branching pattern with common origin of the brachiocephalic and left common carotid arteries. Mild aortic arch atherosclerosis. Mild non stenotic plaque at the left subclavian origin. Right carotid system: Medial course of the common carotid artery. Small volume noncalcified plaque in the proximal ICA with moderate luminal irregularity but no significant stenosis. Left carotid system: Medial course of the common carotid artery. Relatively small volume mixed calcified and noncalcified plaque involving the carotid bifurcation without significant stenosis. Vertebral arteries: Patent and codominant. Moderate proximal left P1 stenosis. Skeleton: Advanced disc degeneration C4-5, C5-6, and C6-7. Slight anterolisthesis of C7 on T1 with underlying facet arthrosis. Other neck: No mass or lymph node enlargement. Upper chest: Clear lung apices. Review of the MIP images confirms the above findings CTA HEAD FINDINGS Anterior circulation: The internal carotid arteries are patent from skullbase to carotid termini. There is mild-to-moderate siphon atherosclerosis bilaterally without significant stenosis. An infundibulum is noted at the left posterior communicating origin. ACAs and MCAs are patent without evidence of major branch occlusion or significant proximal stenosis. No aneurysm. Posterior circulation: Intracranial vertebral arteries are widely patent to the basilar. Dominant left PICA and right AICA. SCA origins are patent. Basilar artery  is widely patent. There are patent posterior communicating arteries bilaterally, right larger than left. The right P1 segment is hypoplastic. No significant PCA stenosis is seen. No  aneurysm. Venous sinuses: Grossly patent within limitations of arterial phase dominant contrast timing. Anatomic variants: None of significance. Review of the MIP images confirms the above findings IMPRESSION: 1. Mild atherosclerosis at the carotid bifurcations in the neck without stenosis. 2. Moderate proximal left vertebral artery stenosis. 3. Mild intracranial atherosclerosis without major vessel occlusion or significant proximal stenosis. Electronically Signed   By: Sebastian Ache M.D.   On: 08/30/2016 21:18   Ct Head Wo Contrast  Result Date: 09/01/2016 CLINICAL DATA:  Follow-up exam status post tPA. EXAM: CT HEAD WITHOUT CONTRAST TECHNIQUE: Contiguous axial images were obtained from the base of the skull through the vertex without intravenous contrast. COMPARISON:  Prior CT 08/30/2016 and MRI from 08/31/2016. FINDINGS: Brain: Stable atrophy with mild chronic small vessel ischemic disease. Evolving patchy acute bilateral MCA territory infarcts again seen, left greater than right. Overall, distribution similar nerve previous MRI. No significant mass effect. Few scattered subtle minimally hyperdense areas within the area of infarction likely reflect the previously seen petechial hemorrhage. No evidence for frank hemorrhagic transformation or other complication. No other acute large vessel territory infarct identified. No mass lesion, midline shift or mass effect. No hydrocephalus. No extra-axial fluid collection. Vascular: Vascular calcifications noted within the carotid siphons. No hyperdense vessel. Skull: Scalp soft tissues demonstrate no acute abnormality. Calvarium intact. Sinuses/Orbits: Globes orbits within normal limits. Paranasal sinuses are clear. No mastoid effusion. IMPRESSION: 1. Similar size and distribution of acute patchy small to moderate bilateral MCA territory infarcts, left greater than right. No evidence for hemorrhagic transformation or other complication status post tPA. 2. No other new  acute intracranial process. Electronically Signed   By: Rise Mu M.D.   On: 09/01/2016 01:33   Ct Angio Neck W Or Wo Contrast  Result Date: 08/30/2016 CLINICAL DATA:  Right arm weakness.  Status post tPA. EXAM: CT ANGIOGRAPHY HEAD AND NECK TECHNIQUE: Multidetector CT imaging of the head and neck was performed using the standard protocol during bolus administration of intravenous contrast. Multiplanar CT image reconstructions and MIPs were obtained to evaluate the vascular anatomy. Carotid stenosis measurements (when applicable) are obtained utilizing NASCET criteria, using the distal internal carotid diameter as the denominator. CONTRAST:  50 mL Isovue 370 COMPARISON:  None. FINDINGS: CTA NECK FINDINGS Aortic arch: Normal variant aortic arch branching pattern with common origin of the brachiocephalic and left common carotid arteries. Mild aortic arch atherosclerosis. Mild non stenotic plaque at the left subclavian origin. Right carotid system: Medial course of the common carotid artery. Small volume noncalcified plaque in the proximal ICA with moderate luminal irregularity but no significant stenosis. Left carotid system: Medial course of the common carotid artery. Relatively small volume mixed calcified and noncalcified plaque involving the carotid bifurcation without significant stenosis. Vertebral arteries: Patent and codominant. Moderate proximal left P1 stenosis. Skeleton: Advanced disc degeneration C4-5, C5-6, and C6-7. Slight anterolisthesis of C7 on T1 with underlying facet arthrosis. Other neck: No mass or lymph node enlargement. Upper chest: Clear lung apices. Review of the MIP images confirms the above findings CTA HEAD FINDINGS Anterior circulation: The internal carotid arteries are patent from skullbase to carotid termini. There is mild-to-moderate siphon atherosclerosis bilaterally without significant stenosis. An infundibulum is noted at the left posterior communicating origin. ACAs  and MCAs are patent without evidence of major branch occlusion or significant proximal stenosis.  No aneurysm. Posterior circulation: Intracranial vertebral arteries are widely patent to the basilar. Dominant left PICA and right AICA. SCA origins are patent. Basilar artery is widely patent. There are patent posterior communicating arteries bilaterally, right larger than left. The right P1 segment is hypoplastic. No significant PCA stenosis is seen. No aneurysm. Venous sinuses: Grossly patent within limitations of arterial phase dominant contrast timing. Anatomic variants: None of significance. Review of the MIP images confirms the above findings IMPRESSION: 1. Mild atherosclerosis at the carotid bifurcations in the neck without stenosis. 2. Moderate proximal left vertebral artery stenosis. 3. Mild intracranial atherosclerosis without major vessel occlusion or significant proximal stenosis. Electronically Signed   By: Sebastian Ache M.D.   On: 08/30/2016 21:18   Mr Brain Wo Contrast  Result Date: 08/31/2016 CLINICAL DATA:  Acute onset RIGHT hand weakness while watching television tonight. Status post tPA. History of hyperlipidemia. EXAM: MRI HEAD WITHOUT CONTRAST TECHNIQUE: Multiplanar, multiecho pulse sequences of the brain and surrounding structures were obtained without intravenous contrast. COMPARISON:  CTA HEAD and CTA HEAD and neck August 30, 2016 FINDINGS: BRAIN: Patchy reduced diffusion LEFT greater than RIGHT parietal lobes reduced diffusion. Scattered LEFT frontal and LEFT basal ganglia foci of reduced diffusion. Corresponding low ADC values and patchy susceptibility artifact LEFT parietal lobe. Chronic micro hemorrhages central pons and RIGHT thalamus associated with chronic hypertension/infarcts. Old bilateral thalamus lacunar infarcts. Scattered subcentimeter supratentorial white matter FLAIR T2 hyperintensities exclusive of the aforementioned abnormalities. No midline shift, mass effect or masses.  Ventricles and sulci are normal for patient's age. No abnormal extra-axial fluid collections. VASCULAR: Normal major intracranial vascular flow voids present at skull base. SKULL AND UPPER CERVICAL SPINE: No abnormal sellar expansion. No suspicious calvarial bone marrow signal. Craniocervical junction maintained. SINUSES/ORBITS: The mastoid air-cells and included paranasal sinuses are well-aerated. The included ocular globes and orbital contents are non-suspicious. OTHER: None. IMPRESSION: Acute patchy small to moderate LEFT MCA territory infarct with petechial hemorrhage and small RIGHT nonhemorrhagic MCA territory infarct. Findings concerning for central embolic etiology. 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 Electronically Signed   By: Awilda Metro M.D.   On: 08/31/2016 18:22   Ct Head Code Stroke W/o Cm  Result Date: 08/30/2016 CLINICAL DATA:  Code stroke.  Right upper extremity weakness. EXAM: CT HEAD WITHOUT CONTRAST TECHNIQUE: Contiguous axial images were obtained from the base of the skull through the vertex without intravenous contrast. COMPARISON:  None. FINDINGS: Brain: There is no evidence of acute cortical infarct, intracranial hemorrhage, mass, midline shift, or extra-axial fluid collection. Mild generalized cerebral atrophy is within normal limits for age. A small subcortical infarct in the left parietal lobe appears chronic, as does a lacunar infarct at/ just inferior to the left caudate head. Cerebral white matter hypodensities elsewhere are nonspecific but compatible with mild chronic small vessel ischemic disease. Vascular: Calcified atherosclerosis at the skullbase. No hyperdense vessel. Skull: No fracture or focal osseous lesion. Sinuses/Orbits: Visualized paranasal sinuses and mastoid air cells are clear. Orbits are unremarkable. Other: None. ASPECTS [facility] Stroke Program Early CT Score) - Ganglionic level infarction (caudate, lentiform nuclei,  internal capsule, insula, M1-M3 cortex): 7 - Supraganglionic infarction (M4-M6 cortex): 3 Total score (0-10 with 10 being normal): 10 IMPRESSION: 1. No evidence of acute intracranial abnormality. 2. ASPECTS is 10. 3. Mild chronic small vessel ischemic disease including chronic lacunar infarcts as above. These results were called by telephone at the time of interpretation on 08/30/2016 at 7:01 pm to Dr. Crista Curb ,  who verbally acknowledged these results. Electronically Signed   By: Sebastian Ache M.D.   On: 08/30/2016 19:02     Assessment/Plan: Diagnosis: B/l embolic MCA infarct Labs and images independently reviewed.  Records reviewed and summated above. Stroke: Continue secondary stroke prophylaxis and Risk Factor Modification listed below:   Antiplatelet therapy:   Blood Pressure Management:  Continue current medication with prn's with permisive HTN per primary team Statin Agent:   Diabetes management:   B/l hemiparesis:  Motor recovery: Fluoxetine  1. Does the need for close, 24 hr/day medical supervision in concert with the patient's rehab needs make it unreasonable for this patient to be served in a less intensive setting? Yes  2. Co-Morbidities requiring supervision/potential complications: gout (monitor for flare, ensure pain does not limit therapies), morbid obesity (Body mass index is 40.61 kg/m., diet and exercise education, encourage weight loss to increase endurance and promote overall health), recent right foot surgery (cont boot and PWB), diastolic dysfunction (monitor for signs and symptoms of fluid overload), HTN (monitor and provide prns in accordance with increased physical exertion and pain), DM (Monitor in accordance with exercise and adjust meds as necessary), leukocytosis (cont to monitor for signs and symptoms of infection, further workup if indicated) 3. Due to safety, disease management and patient education, does the patient require 24 hr/day rehab nursing? Yes 4. Does  the patient require coordinated care of a physician, rehab nurse, PT (1-2 hrs/day, 5 days/week) and OT (1-2 hrs/day, 5 days/week) to address physical and functional deficits in the context of the above medical diagnosis(es)? Yes Addressing deficits in the following areas: balance, endurance, locomotion, strength, transferring, bathing, dressing, toileting and psychosocial support 5. Can the patient actively participate in an intensive therapy program of at least 3 hrs of therapy per day at least 5 days per week? Yes 6. The potential for patient to make measurable gains while on inpatient rehab is excellent 7. Anticipated functional outcomes upon discharge from inpatient rehab are modified independent  with PT, modified independent with OT, n/a with SLP. 8. Estimated rehab length of stay to reach the above functional goals is: 14-17 days. 9. Does the patient have adequate social supports and living environment to accommodate these discharge functional goals? Yes 10. Anticipated D/C setting: Home 11. Anticipated post D/C treatments: HH therapy and Home excercise program 12. Overall Rehab/Functional Prognosis: good  RECOMMENDATIONS: This patient's condition is appropriate for continued rehabilitative care in the following setting: CIR Patient has agreed to participate in recommended program. Yes Note that insurance prior authorization may be required for reimbursement for recommended care.  Comment: Rehab Admissions Coordinator to follow up.  Maryla Morrow, MD, [address], [place] 09/01/2016

## 2016-09-03 NOTE — Progress Notes (Signed)
   Called to see the patient who was having bleeding from his implantable loop site.  The bandage was soaked with blood.  The patient was light headed and his BP dropped.  After further pressure with an ice pack he appeared to have hemostasis. He was given IV fluids and his BP improved.  This was likely vagal.  He is feeling better.  CBC was sent.  Lovenox held.  A new pressure dressing will be applied.

## 2016-09-03 NOTE — Progress Notes (Signed)
In to assess loop recorder site at 0130.  Dressing soaked with blood.  Toribio HarbourKeesha Brown, RN, in to assist.  Old dressing removed from site and small opening oozing blood freely.  Pressure dressing reapplied and Ice pack held in place while rapid response notified.  Patient complained of feeling light headed and like he needed to throw up.  BP 91/55 and pulse 50.  Lesley with RR in to assess patient.  Cardiologist on call, Dr. Antoine PocheHochrein, notifed by Toribio HarbourKeesha Brown, RN, of patient's condition and stated he will be in to assess.  On MD arrival, he removed pressure dressing and small incision with no active bleeding.  Pressure dressing reapplied as well as ice pack held to site.  IVF started by rapid per MD order.  Patient's BP slowly improved.  IVF continued.  Dressing currently dry and intact.  No complaints voiced by patient at this time.    Kelli HopeBarber, Greysen Devino M

## 2016-09-03 NOTE — Plan of Care (Signed)
Problem: Food- and Nutrition-Related Knowledge Deficit (NB-1.1) Goal: Nutrition education Formal process to instruct or train a patient/client in a skill or to impart knowledge to help patients/clients voluntarily manage or modify food choices and eating behavior to maintain or improve health. Outcome: Completed/Met Date Met: 09/03/16 Nutrition Education Note  RD consulted for nutrition education regarding a Heart Healthy diet.   Lipid Panel     Component Value Date/Time   CHOL 179 08/31/2016 0319   TRIG 291 (H) 08/31/2016 0319   HDL 30 (L) 08/31/2016 0319   CHOLHDL 6.0 08/31/2016 0319   VLDL 58 (H) 08/31/2016 0319   LDLCALC 91 08/31/2016 0319    RD provided "Heart Healthy Nutrition Therapy" handout from the Academy of Nutrition and Dietetics. Reviewed patient's dietary recall. Provided examples on ways to decrease sodium and fat intake in diet. Discouraged intake of processed foods. Encouraged fresh fruits and vegetables as well as whole grain sources of carbohydrates to maximize fiber intake. Discussed diabetic friendly drink options.Teach back method used.  Expect fair compliance.  Body mass index is 39.98 kg/m. Pt meets criteria for morbid obesity based on current BMI.  Current diet order is heart healthy/carbohydrate modifed, patient is consuming approximately 75-100% of meals at this time. Pt reports having a good appetite currently and PTA. Labs and medications reviewed. Pt currently has Ensure and Prostat ordered. RD to discontinue Ensure as intake has been adequate. No further nutrition interventions warranted at this time. RD contact information provided. If additional nutrition issues arise, please re-consult RD.  Corrin Parker, MS, RD, LDN Pager # [phone] After hours/ weekend pager # [phone]
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
## 2016-09-03 NOTE — Evaluation (Signed)
Occupational Therapy Assessment and Plan  Patient Details  Name: Aaron Moon MRN: [id number] Date of Birth: Jan 02, 1946  OT Diagnosis: ataxia, cognitive deficits, hemiplegia affecting dominant side and muscle weakness (generalized) Rehab Potential: Rehab Potential (ACUTE ONLY): Good ELOS: 14-16 days   Today's Date: 09/03/2016 OT Individual Time: 4650-3546 OT Individual Time Calculation (min): 82 min     Problem List:  Patient Active Problem List   Diagnosis Date Noted  . Status post placement of implantable loop recorder   . Acute blood loss anemia   . Hypoalbuminemia due to protein-calorie malnutrition ([place])   . Labile blood pressure   . Acute ischemic left MCA stroke ([place]) 09/02/2016  . Ataxia, post-stroke   . Adjustment disorder with mixed anxiety and depressed mood   . Status post right foot surgery   . History of gout   . Primary insomnia   . Acute ischemic stroke ([place])   . Idiopathic gout   . Morbid obesity ([place])   . Diastolic dysfunction   . Benign essential HTN   . Diabetes mellitus ([place])   . Leukocytosis   . CVA (cerebral vascular accident) ([place]) 08/30/2016  . Stroke (cerebrum) ([place]) 08/30/2016  . 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 Hammertoe of right foot 07/20/2016  . Hallux valgus, acquired, bilateral 02/19/2014  . Gout 02/13/2013  . Hyperlipidemia 01/18/2012  . Obesity 01/18/2012    Past Medical History:  Past Medical History:  Diagnosis Date  . Gout   . Hyperlipidemia   . Obesity    Past Surgical History:  Past Surgical History:  Procedure Laterality Date  . ABDOMINAL SURGERY     colostomy & colostomy reversal  . ANKLE SURGERY    . EP IMPLANTABLE DEVICE N/A 09/02/2016   Procedure: Loop Recorder Insertion;  Surgeon: Deboraha Sprang, MD;  Location: [place] CV LAB;  Service: Cardiovascular;  Laterality: N/A;    Assessment & Plan Clinical Impression: Patient is a 71 y.o. R-handed male with history of gout, obesity, right foot  surgery 07/2016--PWB who was admitted on 08/30/16 with sudden onset of right arm weakness and tPA administered at [facility] prior to transfer to [facility]. History taken from chart review and patient. On admission, patient with UE >LE weakness as well as facial weakness. CTA head/neck with moderate proximal L-VA stenosis and mild intracranial atherosclerosis. MRI brain reviewed, showing patchy LEFT MCA infarct with petechial hemorrhage and small RIGHT nonhemorrhagic MCA territory infarct concerning for central embolic etiology.  Follow up CT head reviewed, without hemorrhagic conversion.  2D echo with normal systolic function, no wall abnormalities and grade 1 diastolic dysfunction.  Dr. Leonie Man following and recommended ASA and may need loop recorder.  TEE done 1/24 revealing small PFO, EF 60-65% and trivial MR.  BLE dopplers results pending. Loop recorder placed 1/24.   Therapy evaluations done revealing impaired selective attention, limited awareness of deficits and inability to maintain NWB on RLE. CIR recommended for follow up therapy.    Patient transferred to CIR on 09/02/2016 .    Patient currently requires max-total with basic self-care skills secondary to muscle weakness, unbalanced muscle activation, ataxia, decreased coordination and decreased motor planning, decreased attention to left, decreased awareness, decreased problem solving, decreased safety awareness and decreased memory and decreased standing balance, hemiplegia, decreased balance strategies and difficulty maintaining precautions.  Prior to hospitalization, patient could complete ADLs with modified independent .  Patient will benefit from skilled intervention to decrease level of assist with basic self-care skills prior to discharge  home with care partner.  Anticipate patient will require 24 hour supervision and follow up home health.  OT - End of Session Activity Tolerance: Tolerates 30+ min activity with multiple rests Endurance Deficit:  Yes Endurance Deficit Description: required multiple rest breaks OT Assessment Rehab Potential (ACUTE ONLY): Good Barriers to Discharge: Decreased caregiver support Barriers to Discharge Comments: nephew lives across street, however he works days OT Patient demonstrates impairments in the following area(s): Balance;Cognition;Endurance;Motor;Pain;Safety OT Basic ADL's Functional Problem(s): Grooming;Bathing;Dressing;Toileting OT Transfers Functional Problem(s): Toilet;Tub/Shower OT Additional Impairment(s): Fuctional Use of Upper Extremity OT Plan OT Intensity: Minimum of 1-2 x/day, 45 to 90 minutes OT Frequency: 5 out of 7 days OT Duration/Estimated Length of Stay: 14-16 days OT Treatment/Interventions: Balance/vestibular training;Cognitive remediation/compensation;Discharge planning;Disease mangement/prevention;DME/adaptive equipment instruction;Functional mobility training;Neuromuscular re-education;Pain management;Patient/family education;Psychosocial support;Self Care/advanced ADL retraining;Skin care/wound managment;Splinting/orthotics;Therapeutic Activities;Therapeutic Exercise;UE/LE Strength taining/ROM;UE/LE Coordination activities;Wheelchair propulsion/positioning OT Self Feeding Anticipated Outcome(s): Mod I OT Basic Self-Care Anticipated Outcome(s): Supervision OT Toileting Anticipated Outcome(s): Supervision OT Bathroom Transfers Anticipated Outcome(s): Supervision OT Recommendation Recommendations for Other Services: Neuropsych consult Patient destination: Home Follow Up Recommendations: Home health OT;24 hour supervision/assistance Equipment Recommended: 3 in 1 bedside comode;To be determined   Skilled Therapeutic Intervention OT eval completed with discussion of rehab process, OT purpose, POC, ELOS, and goals.  ADL assessment completed at sit > stand level at sink due to multiple skin areas needing to be covered for shower.  Completed transfer to sink with use of knee walker  with Mod assist sit > stand and min assist while utilizing walker.  Max assist sit > stand at sink due to lower seat surface and pt attempting to place weight through RLE. Pt reporting MD allowing minimal weight bearing for sit > stand and for balancing - educated on current orders for NWB and plans to attempt to follow through with current orders.  No clothing on eval, therapist donned hospital pants requiring total assist due to decreased awareness and mental flexibility requiring therapist to don.  Returned to bed with increased cues for sequencing and safety with transfer.  Left semi-reclined in bed with all needs in reach.  OT Evaluation Precautions/Restrictions  Precautions Precautions: Fall Required Braces or Orthoses: Other Brace/Splint Other Brace/Splint: CAM boot RLE; post op shoe on left Restrictions Weight Bearing Restrictions: Yes RLE Weight Bearing: Non weight bearing Other Position/Activity Restrictions: Pt reports being able to use it for balance in standing but not to walk on it, however per PA NWB Pain Pain Assessment Pain Assessment: 0-10 Pain Score: 5  Pain Type: Acute pain Pain Location: Leg Pain Orientation: Right Pain Descriptors / Indicators: Burning Pain Onset: Gradual Patients Stated Pain Goal: 3 Pain Intervention(s): Medication (See eMAR) Home Living/Prior [place] expects to be discharged to:: Private residence Living Arrangements: Alone Available Help at Discharge: Family, Available PRN/intermittently (nephew lives across the street, but works days) Type of Home: Other(Comment) (condo) Home Access: Level entry Home Layout: One level Bathroom Shower/Tub: Multimedia programmer: Standard  Lives With: Alone Prior Function Level of Independence: Requires assistive device for independence, Needs assistance with homemaking, Independent with basic ADLs (had hired assist with homemaking once a week) Driving: (P)  Yes Vocation: 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 Was driving until foot surgery in December ADL  See Function Navigator Vision/Perception  Vision- History Baseline Vision/History: No visual deficits Patient Visual Report: No change from baseline Vision- Assessment Vision Assessment?:  Yes Eye Alignment: Within Functional Limits Ocular Range of Motion: Within Functional Limits Alignment/Gaze Preference: Within Defined Limits Tracking/Visual Pursuits: Decreased smoothness of horizontal tracking;Unable to hold eye position out of midline;Impaired - to be further tested in functional context Saccades: Additional head turns occurred during testing Visual Fields: No apparent deficits  Cognition Overall Cognitive Status: Impaired/Different from baseline Arousal/Alertness: Awake/alert Orientation Level: Situation;Place;Person Person: Oriented Place: Oriented Situation: Oriented Year: 2018 Month: January Day of Week: Correct Immediate Memory Recall: Sock;Blue;Bed Memory Recall: Sock;Blue Memory Recall Sock: Without Cue Memory Recall Blue: Without Cue Attention: Selective Selective Attention: Impaired Selective Attention Impairment: Verbal basic Awareness: Impaired Problem Solving: Impaired Safety/Judgment: Impaired Sensation Sensation Light Touch: Appears Intact Stereognosis: Not tested Hot/Cold: Not tested Proprioception: Impaired Detail Proprioception Impaired Details: Impaired RUE Coordination Gross Motor Movements are Fluid and Coordinated: No Fine Motor Movements are Fluid and Coordinated: No Coordination and Movement Description: RUE dysmetria and ataxia  Finger Nose Finger Test: dysmetria and ataxia on Rt, WNL on Lt Extremity/Trunk Assessment RUE Assessment RUE Assessment: Exceptions to [facility] RUE AROM (degrees) Overall AROM Right Upper Extremity: Deficits RUE  Overall AROM Comments: Shoulder flexion grossly 120 degrees, able to complete horizontal adduction WFL RUE PROM (degrees) Overall PROM Right Upper Extremity: Within functional limits for tasks performed RUE Strength RUE Overall Strength: Deficits RUE Overall Strength Comments: strength grossly 4/5 proximal to distal, however loose gross grasp LUE Assessment LUE Assessment: Within Functional Limits   See Function Navigator for Current Functional Status.   Refer to Care Plan for Long Term Goals  Recommendations for other services: Neuropsych   Discharge Criteria: Patient will be discharged from OT if patient refuses treatment 3 consecutive times without medical reason, if treatment goals not met, if there is a change in medical status, if patient makes no progress towards goals or if patient is discharged from hospital.  The above assessment, treatment plan, treatment alternatives and goals were discussed and mutually agreed upon: by patient  Simonne Come 09/03/2016, 12:28 PM

## 2016-09-03 NOTE — Evaluation (Signed)
Speech Language Pathology Assessment and Plan  Patient Details  Name: Aaron Moon MRN: [id number] Date of Birth: [birth year]  SLP Diagnosis: Cognitive Impairments;Speech and Language deficits  Rehab Potential: Excellent ELOS: 14-16 days     Today's Date: 09/03/2016 SLP Individual Time: 0730-0830 SLP Individual Time Calculation (min): 60 min   Problem List:  Patient Active Problem List   Diagnosis Date Noted  . Status post placement of implantable loop recorder   . Acute blood loss anemia   . Hypoalbuminemia due to protein-calorie malnutrition ([place])   . Labile blood pressure   . Acute ischemic left MCA stroke ([place]) 09/02/2016  . Ataxia, post-stroke   . Adjustment disorder with mixed anxiety and depressed mood   . Status post right foot surgery   . History of gout   . Primary insomnia   . Acute ischemic stroke ([place])   . Idiopathic gout   . Morbid obesity ([place])   . Diastolic dysfunction   . Benign essential HTN   . Diabetes mellitus ([place])   . Leukocytosis   . CVA (cerebral vascular accident) ([place]) 08/30/2016  . Stroke (cerebrum) ([place]) 08/30/2016  . 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 Hammertoe of right foot 07/20/2016  . Hallux valgus, acquired, bilateral 02/19/2014  . Gout 02/13/2013  . Hyperlipidemia 01/18/2012  . Obesity 01/18/2012   Past Medical History:  Past Medical History:  Diagnosis Date  . Gout   . Hyperlipidemia   . Obesity    Past Surgical History:  Past Surgical History:  Procedure Laterality Date  . ABDOMINAL SURGERY     colostomy & colostomy reversal  . ANKLE SURGERY    . EP IMPLANTABLE DEVICE N/A 09/02/2016   Procedure: Loop Recorder Insertion;  Surgeon: Deboraha Sprang, MD;  Location: [place] [facility];  Service: Cardiovascular;  Laterality: N/A;  . TEE WITHOUT CARDIOVERSION N/A 09/02/2016   Procedure: TRANSESOPHAGEAL ECHOCARDIOGRAM (TEE);  Surgeon: Larey Dresser, MD;  Location: [facility] ENDOSCOPY;  Service: Cardiovascular;   Laterality: N/A;    Assessment / Plan / Recommendation Clinical Impression Patient is a 71 y.o.R-handed male with history of gout, obesity, right foot surgery 07/2016--PWB who was admitted on 08/30/16 with sudden onset of right arm weakness and tPA administered at [facility] prior to [facility]. History taken from chart review and patient. On admission, patient with UE >LE weakness as well as facial weakness. CTA head/neck with moderate proximal L-VA stenosis and mild intracranial atherosclerosis. MRI brain reviewed, showing patchy LEFT MCA infarct with petechial hemorrhage and small RIGHT nonhemorrhagic MCA territory infarct concerning for central embolic etiology.  Follow up CT head reviewed, without hemorrhagic conversion.  2D echo with normal systolic function, no wall abnormalities and grade 1 diastolic dysfunction.  Dr. Leonie Man following and recommended ASA and may need loop recorder.  TEE done 1/24 revealing small PFO, EF 60-65% and trivial MR.  BLE dopplers results pending. Loop recorder placed 1/24. Therapy evaluations done revealing impaired selective attention, limited awareness of deficits and inability to maintain NWB on RLE. CIR recommended for follow up therapy and patient admitted 09/02/16.   Patient demonstrates mild-moderate cognitive impairments impacting selective attention, functional problem solving, recall and awareness which impacts his ability to complete functional and familiar tasks safely. Patient also demonstrates impairments with complex comprehension and complex verbal expression with awareness of errors. Patient was administered a BSE and consumed regular textures with thin liquids via straw without overt s/s of aspiration. Recommend patient continue current diet. Patient would benefit from  skilled SLP intervention to maximize his cognitive-linguistic function and overall functional independence prior to discharge.    Skilled Therapeutic Interventions          Administered a  cognitive-linguistic evaluation and BSE. Please see above for details.   SLP Assessment  Patient will need skilled Speech Lanaguage Pathology Services during CIR admission    Recommendations  SLP Diet Recommendations: Age appropriate regular solids;Thin Liquid Administration via: Cup;Straw Medication Administration: Whole meds with liquid Supervision: Patient able to self feed Compensations: Slow rate;Small sips/bites;Minimize environmental distractions;Lingual sweep for clearance of pocketing Oral Care Recommendations: Oral care BID Recommendations for Other Services: Neuropsych consult Patient destination: Home Follow up Recommendations: 24 hour supervision/assistance;Home Health SLP;Outpatient SLP    SLP Frequency 3 to 5 out of 7 days   SLP Duration  SLP Intensity  SLP Treatment/Interventions 14-16 days   Minumum of 1-2 x/day, 30 to 90 minutes  Cognitive remediation/compensation;Cueing hierarchy;Functional tasks;Patient/family education;Therapeutic Activities;Internal/external aids;Speech/Language facilitation;Environmental controls    Pain Pain Assessment Pain Assessment: 0-10 Pain Score: 6  Pain Type: Acute pain Pain Location: Leg Pain Orientation: Right (hamstring) Pain Descriptors / Indicators: Burning Pain Onset: Gradual Patients Stated Pain Goal: 3 Pain Intervention(s): Emotional support  Prior Functioning Type of Home: Apartment  Lives With: Alone Available Help at Discharge: Family;Available PRN/intermittently (nephew lives across street, works during day) Vocation: Other (comment) (worked FT until October)  Function:  Eating Eating   Modified Consistency Diet: No Eating Assist Level: More than reasonable amount of time           Cognition Comprehension Comprehension assist level: Understands basic 75 - 89% of the time/ requires cueing 10 - 24% of the time  Expression   Expression assist level: Expresses basic 90% of the time/requires cueing < 10%  of the time.  Social Interaction Social Interaction assist level: Interacts appropriately 90% of the time - Needs monitoring or encouragement for participation or interaction.  Problem Solving Problem solving assist level: Solves basic 75 - 89% of the time/requires cueing 10 - 24% of the time  Memory Memory assist level: Recognizes or recalls 75 - 89% of the time/requires cueing 10 - 24% of the time   Short Term Goals: Week 1: SLP Short Term Goal 1 (Week 1): Patient will demonstrate functional problem solving with basic and familiar tasks with Min A verbal cues.  SLP Short Term Goal 2 (Week 1): Patient will utilize external memory aids to recall new, daily information with Min A verbal and visual cues.  SLP Short Term Goal 3 (Week 1): Patient will self-monitor and correct errors during functional tasks with Min A question cues.  SLP Short Term Goal 4 (Week 1): Patient will follow complex tasks/directions with supervision verbal cues.   Refer to Care Plan for Long Term Goals  Recommendations for other services: Neuropsych  Discharge Criteria: Patient will be discharged from SLP if patient refuses treatment 3 consecutive times without medical reason, if treatment goals not met, if there is a change in medical status, if patient makes no progress towards goals or if patient is discharged from hospital.  The above assessment, treatment plan, treatment alternatives and goals were discussed and mutually agreed upon: by patient  ,  09/03/2016, 3:34 PM

## 2016-09-03 NOTE — Evaluation (Signed)
Physical Therapy Assessment and Plan  Patient Details  Name: Aaron Moon MRN: [id number] Date of Birth: 01-May-1946  PT Diagnosis: Abnormality of gait, Coordination disorder, Hemiparesis dominant and Impaired cognition Rehab Potential: Fair ELOS: 14-16 days   Today's Date: 09/03/2016 PT Individual Time: 1330-1445 PT Individual Time Calculation (min): 75 min    Problem List:  Patient Active Problem List   Diagnosis Date Noted  . Status post placement of implantable loop recorder   . Acute blood loss anemia   . Hypoalbuminemia due to protein-calorie malnutrition ([place])   . Labile blood pressure   . Acute ischemic left MCA stroke ([place]) 09/02/2016  . Ataxia, post-stroke   . Adjustment disorder with mixed anxiety and depressed mood   . Status post right foot surgery   . History of gout   . Primary insomnia   . Acute ischemic stroke ([place])   . Idiopathic gout   . Morbid obesity ([place])   . Diastolic dysfunction   . Benign essential HTN   . Diabetes mellitus ([place])   . Leukocytosis   . CVA (cerebral vascular accident) ([place]) 08/30/2016  . Stroke (cerebrum) ([place]) 08/30/2016  . 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 Hammertoe of right foot 07/20/2016  . Hallux valgus, acquired, bilateral 02/19/2014  . Gout 02/13/2013  . Hyperlipidemia 01/18/2012  . Obesity 01/18/2012    Past Medical History:  Past Medical History:  Diagnosis Date  . Gout   . Hyperlipidemia   . Obesity    Past Surgical History:  Past Surgical History:  Procedure Laterality Date  . ABDOMINAL SURGERY     colostomy & colostomy reversal  . ANKLE SURGERY    . EP IMPLANTABLE DEVICE N/A 09/02/2016   Procedure: Loop Recorder Insertion;  Surgeon: Deboraha Sprang, MD;  Location: [place] CV LAB;  Service: Cardiovascular;  Laterality: N/A;  . TEE WITHOUT CARDIOVERSION N/A 09/02/2016   Procedure: TRANSESOPHAGEAL ECHOCARDIOGRAM (TEE);  Surgeon: Larey Dresser, MD;  Location: [facility] ENDOSCOPY;  Service:  Cardiovascular;  Laterality: N/A;    Assessment & Plan Clinical Impression: A 71 y.o. R-handed male with history of gout, obesity, right foot surgery 07/2016--PWB who was admitted on 08/30/16 with sudden onset of right arm weakness and [facility]PA administered at [facility] prior to transfer to [facility].  History taken from chart review and patient. On admission, patient with UE >LE weakness as well as facial weakness. CTA head/neck with moderate proximal L-VA stenosis and mild intracranial atherosclerosis.  MRI brain reviewed, showing patchy LEFT MCA infarct with petechial hemorrhage and small RIGHT nonhemorrhagic MCA territory infarct concerning for central embolic etiology. CT 1/22, showing b/l L>R infarcts. 2D echo with normal systolic function, no wall abnormalities and grade 1 diastolic dysfunction. Dr. Leonie Man following and recommended ASA and  loop recorder.  TEE done 1/24 revealing small PFO, EF 60-65% and trivial MR.  BLE dopplers ordered   Therapy evaluations done revealing impaired selective attention, limited awareness of deficits and inability to maintain PWB on RLE. CIR recommended for follow up therapy.  Patient transferred to CIR on 09/02/2016 .   Patient currently requires mod with mobility secondary to muscle weakness, decreased cardiorespiratoy endurance, unbalanced muscle activation and decreased coordination, decreased attention to right and decreased standing balance and decreased balance strategies.  Prior to hospitalization, patient was modified independent  with mobility and lived with Alone in a [place] home.  Home access is  Level entry.  Patient will benefit from skilled PT intervention to maximize safe functional mobility, minimize  fall risk and decrease caregiver burden for planned discharge home with 24 hour supervision.  Anticipate patient will benefit from follow up [place] at discharge.  PT - End of Session Activity Tolerance: Tolerates 30+ min activity with multiple rests Endurance Deficit:  Yes Endurance Deficit Description: required multiple rest breaks PT Assessment Rehab Potential (ACUTE/IP ONLY): Fair Barriers to Discharge: Decreased caregiver support Barriers to Discharge Comments: lives alone, minimal support PT Patient demonstrates impairments in the following area(s): Balance;Endurance;Motor;Safety PT Transfers Functional Problem(s): Bed Mobility;Bed to Chair;Car;Furniture PT Locomotion Functional Problem(s): Stairs;Ambulation;Wheelchair Mobility PT Plan PT Intensity: Minimum of 1-2 x/day ,45 to 90 minutes PT Frequency: 5 out of 7 days PT Duration Estimated Length of Stay: 14-16 days PT Treatment/Interventions: Ambulation/gait training;Balance/vestibular training;Cognitive remediation/compensation;Discharge planning;DME/adaptive equipment instruction;Neuromuscular re-education;Functional mobility training;Pain management;Patient/family education;Psychosocial support;Splinting/orthotics;Stair training;Therapeutic Exercise;Therapeutic Activities;UE/LE Strength taining/ROM;UE/LE Coordination activities;Visual/perceptual remediation/compensation;Wheelchair propulsion/positioning PT Transfers Anticipated Outcome(s): supervision PT Locomotion Anticipated Outcome(s): supervision PT Recommendation Recommendations for Other Services: Neuropsych consult Follow Up Recommendations: Home health PT;24 hour supervision/assistance Patient destination: Home Equipment Recommended: To be determined  Skilled Therapeutic Intervention No c/o pain.  Session focus on initial evaluation, pt education, balance, transfers, gait training, and w/c positioning.    Pt transfers sit<>stand from recliner with RW and mod assist, not able to maintain NWB or PWB.  PT provided extensive education on weight bearing restrictions but pt unable to maintain, question whether secondary to cognitive deficits. Sit<>stand from a variety of surfaces with knee scooter, pt continues to require mod assist for balance,  but able to maintain balance with propulsion x50' with overall min assist.  If pt is NWB, will need to practice with knee scooter as this will likely be the only mobility option, other than w/c, where he will be able to maintain his restriction.  PT provided pt with appropriate size w/c and cushion and adjusted leg rests for optimum positioning.  Car transfer with overall min assist, education provided again on weight bearing restrictions and safe transfer techniques.  Pt c/o pain in hamstring, but unable to describe the pain.  No pain with palpation, AROM, or PROM, but states he can't have his LE extended in a sitting position without pain, suspect shortened hamstrings.  Pt returned to room at end of session and positioned in recliner with call bell in reach and needs met.    PT Evaluation Precautions/Restrictions Precautions Precautions: Fall Required Braces or Orthoses: Other Brace/Splint Other Brace/Splint: CAM boot RLE, post op shoe on left Restrictions Weight Bearing Restrictions: Yes RLE Weight Bearing: Non weight bearing Other Position/Activity Restrictions: Pt reports being able to use it for balance in standing but not to walk on it, however per PA NWB Home Living/Prior Functioning Home Living Living Arrangements: Alone Available Help at Discharge: Family;Available PRN/intermittently (nephew lives across street, works during day) Type of Home: Apartment Home Access: Level entry [place]: One level Bathroom Shower/Tub: Multimedia programmer: Standard  Lives With: Alone Prior Function Level of Independence: Independent with gait;Independent with transfers;Requires assistive device for independence (has been using knee scooter for past 5 weeks following R foot reconstructive surgery)  Able to Take Stairs?: No (not for past 5 weeks) Driving: No (not for past 5 weeks) Vocation: Other (comment) (worked FT until October) Comments: Uses knee walker for household distances and  w/c for community. Was driving until foot surgery in December Vision/Perception  Vision - Assessment Eye Alignment: Within Functional Limits Ocular Range of Motion: Within Functional Limits Alignment/Gaze Preference: Within Defined Limits Tracking/Visual Pursuits: Decreased smoothness  of horizontal tracking;Unable to hold eye position out of midline;Impaired - to be further tested in functional context Saccades: Additional head turns occurred during testing  Cognition Overall Cognitive Status: Impaired/Different from baseline Arousal/Alertness: Awake/alert Orientation Level: Oriented X4 Attention: Selective Selective Attention: Impaired Selective Attention Impairment: Verbal basic;Functional basic Memory: Impaired Memory Impairment: Decreased recall of new information;Retrieval deficit Awareness: Impaired Awareness Impairment: Intellectual impairment;Emergent impairment;Anticipatory impairment Problem Solving: Impaired Problem Solving Impairment: Verbal basic;Functional basic Behaviors: Perseveration Safety/Judgment: Impaired Sensation Sensation Light Touch: Appears Intact Stereognosis: Not tested Hot/Cold: Not tested Proprioception: Impaired Detail Proprioception Impaired Details: Impaired RUE Coordination Gross Motor Movements are Fluid and Coordinated: No Fine Motor Movements are Fluid and Coordinated: No Coordination and Movement Description: RUE dysmetria and ataxia  Finger Nose Finger Test: dysmetria and ataxia on Rt, WNL on Lt Motor  Motor Motor: Abnormal postural alignment and control Motor - Skilled Clinical Observations: RUE>RLE hemiparesis  Mobility Bed Mobility Bed Mobility: Not assessed (pt reports needing no physical assist but uses bed rails ) Transfers Transfers: Yes Sit to Stand: 3: Mod assist Stand to Sit: 4: Min guard Squat Pivot Transfers: 4: Min assist Locomotion  Ambulation Ambulation: Yes Ambulation/Gait Assistance: 4: Min assist Ambulation  Distance (Feet): 50 Feet Assistive device: Other (Comment) (knee scooter) Ambulation/Gait Assistance Details: Verbal cues for precautions/safety;Verbal cues for sequencing Gait Gait: No Stairs / Additional Locomotion Stairs: No Wheelchair Mobility Wheelchair Mobility: No  Trunk/Postural Assessment  Cervical Assessment Cervical Assessment: Within Functional Limits Thoracic Assessment Thoracic Assessment: Within Functional Limits Lumbar Assessment Lumbar Assessment: Within Functional Limits Postural Control Postural Control: Within Functional Limits  Balance Balance Balance Assessed: Yes Static Standing Balance Static Standing - Balance Support: During functional activity;Right upper extremity supported;Left upper extremity supported;Bilateral upper extremity supported Static Standing - Level of Assistance: 4: Min assist Dynamic Standing Balance Dynamic Standing - Balance Support: During functional activity;Right upper extremity supported;Left upper extremity supported;Bilateral upper extremity supported Dynamic Standing - Level of Assistance: 4: Min assist Extremity Assessment  RUE Assessment RUE Assessment: Exceptions to [facility] RUE AROM (degrees) Overall AROM Right Upper Extremity: Deficits RUE Overall AROM Comments: Shoulder flexion grossly 120 degrees, able to complete horizontal adduction WFL RUE PROM (degrees) Overall PROM Right Upper Extremity: Within functional limits for tasks performed RUE Strength RUE Overall Strength: Deficits RUE Overall Strength Comments: strength grossly 4/5 proximal to distal, however loose gross grasp LUE Assessment LUE Assessment: Within Functional Limits RLE Assessment RLE Assessment: Within Functional Limits (grossly 4+/5) LLE Assessment LLE Assessment: Within Functional Limits (grossly 4+/5)   See Function Navigator for Current Functional Status.   Refer to Care Plan for Long Term Goals  Recommendations for other services:  Neuropsych  Discharge Criteria: Patient will be discharged from PT if patient refuses treatment 3 consecutive times without medical reason, if treatment goals not met, if there is a change in medical status, if patient makes no progress towards goals or if patient is discharged from hospital.  The above assessment, treatment plan, treatment alternatives and goals were discussed and mutually agreed upon: by patient  Urban Gibson E Pen[place]-C[place] 09/03/2016, 2:52 PM

## 2016-09-03 NOTE — Progress Notes (Signed)
[facility] PHYSICAL MEDICINE & REHABILITATION     PROGRESS NOTE  Subjective/Complaints:  Pt seen laying in bed this AM.  He had some bleeding from his loop recorder overnight and it was extensively packed.  He became hypotensive and started on IVF. Rapid response and Cards were called. This AM no bleeding, minimal dry blood on dressing.  Pt working with SLP.  He has questions about the pins in his foot.  ROS: Denies CP, SOB, N/V/D.  Objective: Vital Signs: Blood pressure (!) 149/77, pulse 78, temperature 97.8 F (36.6 C), temperature source Oral, resp. rate 17, height 5\' 7"  (1.702 m), weight 115.8 kg (255 lb 4.7 oz), SpO2 97 %. No results found.  Recent Labs  09/03/16 0213  WBC 9.4  HGB 12.0*  HCT 37.4*  PLT 233    Recent Labs  09/03/16 0213  NA 136  K 4.0  CL 102  GLUCOSE 175*  BUN 13  CREATININE 1.19  CALCIUM 8.5*   CBG (last 3)   Recent Labs  09/02/16 1124 09/02/16 1701 09/03/16 0636  GLUCAP 152* 155* 136*    Wt Readings from Last 3 Encounters:  09/02/16 115.8 kg (255 lb 4.7 oz)  08/31/16 117.6 kg (259 lb 4.2 oz)  08/08/15 129.3 kg (285 lb)    Physical Exam:  BP (!) 149/77 (BP Location: Right Arm)   Pulse 78   Temp 97.8 F (36.6 C) (Oral)   Resp 17   Ht 5\' 7"  (1.702 m)   Wt 115.8 kg (255 lb 4.7 oz)   SpO2 97%   BMI 39.98 kg/m  Constitutional: He appears well-developed. Obese  HENT: Normocephalic and atraumatic.  Eyes: EOM are normal. No discharge.  Cardiovascular: Normal rate and regular rhythm.  No JVD. Respiratory: Effort normal and breath sounds normal.  GI: Soft. Bowel sounds are normal. Obese  Musculoskeletal: He exhibits no edema or tenderness.  Neurological: He is alert and oriented.  Mild right facial weakness.  Speech clear.  Able to follow commands.  Motor: RUE: 4/5 proximal to distal, severe dysmetria and ataxia RLE: 5/5 proximally, boot distally LUE: 4+/5 proximal to distal LLE: 5/5 proximal to distal  Skin: Loop recorder site  with minimal dry blood on dressing.  Psychiatric: ?PBA vs. Normal for pt laughter   Assessment/Plan: 1. Functional deficits secondary to B/l embolic MCA infarcts which require 3+ hours per day of interdisciplinary therapy in a comprehensive inpatient rehab setting. Physiatrist is providing close team supervision and 24 hour management of active medical problems listed below. Physiatrist and rehab team continue to assess barriers to discharge/monitor patient progress toward functional and medical goals.  Function:  Bathing Bathing position      Bathing parts      Bathing assist        Upper Body Dressing/Undressing Upper body dressing                    Upper body assist        Lower Body Dressing/Undressing Lower body dressing                                  Lower body assist        Toileting Toileting          Toileting assist     Transfers Chair/bed transfer   Chair/bed transfer method: Stand pivot Chair/bed transfer assist level: Moderate assist (Pt 50 - 74%/lift or lower)  Chair/bed transfer assistive device: Scientist, physiological          Cognition Comprehension    Expression Expression assist level: Expresses basic 75 - 89% of the time/requires cueing 10 - 24% of the time. Needs helper to occlude trach/needs to repeat words.  Social Interaction Social Interaction assist level: Interacts appropriately with others with medication or extra time (anti-anxiety, antidepressant).  Problem Solving Problem solving assist level: Solves basic 90% of the time/requires cueing < 10% of the time  Memory Memory assist level: Recognizes or recalls 90% of the time/requires cueing < 10% of the time    Medical Problem List and Plan: 1.  Limitations in self care, mobility deficits secondary to B/l embolic MCA infarcts.  Begin CIR 2.  DVT Prophylaxis/Anticoagulation: Pharmaceutical: Lovenox, held last night,  resume tonight 3. Pain Management: tylenol prn.  4. Mood: LCSW to follow for evaluation and support.  Mild adjustment issues but denies anxiety/depression. Monitor for now.  ?PBA, will cont to monitor 5. Neuropsych: This patient is capable of making decisions on his own behalf. 6. Skin/Wound Care: routine pressure relief measures. Cleanse incision with normal saline and apply Gentamicin cream every other day. 7. Fluids/Electrolytes/Nutrition: Monitor I/O. Educate patient on heart healthy diet. 8. Right forefoot reconstruction: NWB with boot for support.   Will follow up regarding pin removal 9. H/o Gout: Stable. Monitor for now . 10. Insomnia: managed by trazodone.  11.  New diagnosis Diabetes: Hgb A1c- 7.0. Consulted RD for education. Monitor BS ac/hs  Started low dose metformin.   Use SSI for elevated BS.   Monitor with increased activity 12. Labile blood pressure  Likely vagal episode overnight, elevated this AM  Monitor with increased activity 13. Hypoalbuminemia  Supplement initiated 1/25 14. ABLA  Hb 12.0 on 1/25  Cont to monitor  LOS (Days) 1 A FACE TO FACE EVALUATION WAS PERFORMED  Aaron Moon Aaron Moon 09/03/2016 8:35 AM

## 2016-09-03 NOTE — Significant Event (Signed)
Rapid Response Event Note RN called for increase bleeding from loop recorder site, lightheaded, nauseous, and hypotensive  Overview: Time Called: 0145 Arrival Time: 0146 Event Type: Hypotension  Initial Focused Assessment: On arrival pt gray, cool, clammy to touch, a/o x4, ice pack and pressure being applied to site by Solectron Corporationmber RN. BP 68/46 (51), HR 52, RR 16, 100% RA. Dr. Antoine PocheHochrein paged prior to my arrival, came to bedside to assess pt, verbal orders to continue maintenance fluids after 500 cc bolus completed as pt's BP was improving with fluids, keep pt flat. He felt pt a a vagal episode.   Interventions: 20g IV placed to Left hand, 500 cc bolus of NS administered, ordered am labs now stat to check hgb results 12.0, pt laid flat in bed, reapplied pressure dressing. BP 112/67 (77), HR 61, RR 18, 98 RA Plan of Care (if not transferred): Continue to monitor pt, Amber RN aware of plan and to call RRT as needed.  Event Summary: Name of Physician Notified: Dr. Antoine PocheHochrein  at  (PTA RRT )    at    Outcome: Stayed in room and stabalized     HillmanSHULAR, Paulla Mcclaskey FowlerPaige

## 2016-09-03 NOTE — Progress Notes (Signed)
Patient information reviewed and entered into eRehab system by Coleby Yett, RN, CRRN, PPS Coordinator.  Information including medical coding and functional independence measure will be reviewed and updated through discharge.    

## 2016-09-03 NOTE — Progress Notes (Signed)
Dressing to left chest clean, dry and intact.  No active bleeding assessed.  Patient easily aroused.  Denies discomfort.  VSS.  Skin warm and dry to touch.  IVF continued.    Kelli HopeBarber, Korver Graybeal M

## 2016-09-03 NOTE — Progress Notes (Signed)
Aaron Diones, RN Rehab Admission Coordinator Signed Physical Medicine and Rehabilitation  PMR Pre-admission Date of Service: 09/02/2016 12:35 PM  Related encounter: ED to Hosp-Admission (Discharged) from 08/30/2016 in [facility]       _0 Hide copied text PMR Admission Coordinator Pre-Admission Assessment  Patient: Aaron Moon is an 71 y.o., male MRN: [id number] DOB: Jan 01, 1946 Height: _1  (170.2 cm) Weight: 117.6 kg (259 lb 4.2 oz)                                                                                                                                                  Insurance Information HMO:   PPO:  Yes     PCP:       IPA:       80/20:       OTHER:  Group # [id number] PRIMARY:  [place]       Policy#: [id number]      Subscriber:  [place] CM Name: Jeani Hawking      Phone#:  [phone]     Fax#:  [phone] Pre-Cert#: [id number]      Employer:   Benefits:  Phone #: [phone]     Name:  Aaron Moon. Date:  08/21/2000     Deduct:  $0      Out of Pocket Max: $5500 (met $118.65      Life Max:  unlimited CIR: $175 days 1-5, total $875 per admission      SNF:  No benefits Outpatient:  50 visits combined     Co-Pay: $40/visit Home Health: 25 visits      Co-Pay: $30/visit DME: 70%     Co-Pay: 30% Providers: in network  SECONDARY: VA benefits 82%   Policy#:        Subscriber:   CM Name:        Phone#:       Fax#:   Pre-Cert#:        Employer:   Benefits:  Phone #:       Name:   Eff. Date:       Deduct:        Out of Pocket Max:        Life Max:   CIR:        SNF:   Outpatient:       Co-Pay:   Home Health:        Co-Pay:   DME:       Co-Pay:    Emergency Contact Information        Contact Information    Name Relation Home Work Mobile   [place] Sister   [phone]     Current Medical History  Patient Admitting Diagnosis: B embolic MCA infarct    History of Present Illness: A 71 y.o.R-handed male with  history of gout,  obesity, right foot surgery 07/2016--PWB who was admitted on 08/30/16 with sudden onset of right arm weakness and tPA administered at [facility] prior to transfer to [facility]. History taken from chart review and patient. On admission, patient with UE >LE weakness as well as facial weakness. CTA head/neck with moderate proximal L-VA stenosis and mild intracranial atherosclerosis. MRI brain reviewed, showing patchy LEFT MCA infarct with petechial hemorrhage and small RIGHT nonhemorrhagic MCA territory infarct concerning for central embolic etiology. CT 1/22, showing b/l L>R infarcts. 2D echo with normal systolic function, no wall abnormalities and grade 1 diastolic dysfunction. Dr. [place]ie Man following and recommended ASA and  loop recorder. TEE done 1/24 revealing small PFO, EF 60-65% and trivial MR. BLE dopplers ordered  Therapy evaluations done revealing impaired selective attention, limited awareness of deficits and inability to maintain PWB on RLE. CIR recommended for follow up therapy.   Was using a knee walker at home. Has food delivered and hired help with housework once a week. Independent and working prior to surgery 05/2016. Has a nephew in town.    Total: 4=NIH  Past Medical History      Past Medical History:  Diagnosis Date  . Gout   . Hyperlipidemia   . Obesity     Family History  family history includes ALS in his mother; Pancreatic cancer in his father.  Prior Rehab/Hospitalizations: No previous rehab admissions.  Has the patient had major surgery during 100 days prior to admission? Yes.  He had foot surgery 05/28/16 and 07/30/16 with boot and knee walker in use.  Current Medications   Current Facility-Administered Medications:  .  [COMPLETED] alteplase (ACTIVASE) 1 mg/mL infusion 90 mg, 90 mg, Intravenous, Once **FOLLOWED BY** 0.9 %  sodium chloride infusion, 50 mL, Intravenous, Once, Forde Dandy, MD .  0.9 %  sodium chloride infusion, , Intravenous,  Continuous, Greta Doom, MD, Last Rate: 75 mL/hr at 09/01/16 0752 .  acetaminophen (TYLENOL) tablet 650 mg, 650 mg, Oral, Q4H PRN, 650 mg at 09/01/16 2324 **OR** acetaminophen (TYLENOL) solution 650 mg, 650 mg, Per Tube, Q4H PRN **OR** acetaminophen (TYLENOL) suppository 650 mg, 650 mg, Rectal, Q4H PRN, Greta Doom, MD .  albuterol (PROVENTIL) (2.5 MG/3ML) 0.083% nebulizer solution 3 mL, 3 mL, Inhalation, Q6H PRN, Early Chars Rinehuls, PA-C .  aspirin EC tablet 325 mg, 325 mg, Oral, Daily, David L Rinehuls, PA-C, 325 mg at 09/01/16 1213 .  atorvastatin (LIPITOR) tablet 20 mg, 20 mg, Oral, q1800, David L Rinehuls, PA-C, 20 mg at 09/01/16 1810 .  feeding supplement (ENSURE ENLIVE) (ENSURE ENLIVE) liquid 237 mL, 237 mL, Oral, BID BM, Garvin Fila, MD, 237 mL at 09/01/16 1408 .  gentamicin cream (GARAMYCIN) 0.1 %, , Topical, Once per day on Tue Thu Sat, Pramod S Sethi, MD, 1 application at 62/37/62 1711 .  insulin aspart (novoLOG) injection 0-9 Units, 0-9 Units, Subcutaneous, TID WC, David L Rinehuls, PA-C, 1 Units at 09/02/16 0700 .  pantoprazole (PROTONIX) EC tablet 40 mg, 40 mg, Oral, QHS, Garvin Fila, MD, 40 mg at 09/01/16 2324 .  traZODone (DESYREL) tablet 100 mg, 100 mg, Oral, QHS, Wallie Char, 100 mg at 09/01/16 2324  Patients Current Diet: Diet heart healthy/carb modified Room service appropriate? Yes; Fluid consistency: Thin  Precautions / Restrictions Precautions Precautions: Fall Other Brace/Splint: CAM boot RLE; post op shoe on left Restrictions Weight Bearing Restrictions: Yes RLE Weight Bearing: Partial weight bearing Other Position/Activity Restrictions: Reports being able to use it for balance in standing  but not to walk on it.   Has the patient had 2 or more falls or a fall with injury in the past year?No  Prior Activity Level Community (5-7x/wk): Went out daily, was driving.  Home Assistive Devices / Equipment Home Assistive Devices/Equipment:  None Home Equipment: Walker - 2 wheels, Other (comment), Wheelchair - manual, Shower seat (knee walker)  Prior Device Use: Indicate devices/aids used by the patient prior to current illness, exacerbation or injury? Knee Walker  Prior Functional Level Prior Function Level of Independence: Independent Comments: Uses knee walker for household distances and w/c for community. Was driving until foot surgery in December  Self Care: Did the patient need help bathing, dressing, using the toilet or eating?  Independent  Indoor Mobility: Did the patient need assistance with walking from room to room (with or without device)? Independent  Stairs: Did the patient need assistance with internal or external stairs (with or without device)? Independent  Functional Cognition: Did the patient need help planning regular tasks such as shopping or remembering to take medications? Independent  Current Functional Level Cognition  Arousal/Alertness: Awake/alert Overall Cognitive Status: Impaired/Different from baseline Current Attention Level: Sustained Orientation Level: Oriented X4 Following Commands: Follows multi-step commands inconsistently Safety/Judgement: Decreased awareness of safety, Decreased awareness of deficits General Comments: Pt with Rt inattention.  He is unable to follow multi step instruction with distraction.  He demonstrates poor safety awareness, and poor judgement.  pt with inappropriate affect to situation  Attention: Selective Selective Attention: Impaired Selective Attention Impairment: Verbal basic Memory: Impaired Memory Impairment: Decreased recall of new information, Retrieval deficit Awareness: Impaired Awareness Impairment: Intellectual impairment, Emergent impairment, Anticipatory impairment Problem Solving: Impaired Problem Solving Impairment: Verbal basic, Functional basic Safety/Judgment: Impaired    Extremity Assessment (includes  Sensation/Coordination)  Upper Extremity Assessment: RUE deficits/detail RUE Deficits / Details: Pt demonstrates shoulder flexion ~110*.  elbow flex/ext [facility] actively.  Mass grasp/release.  He can oppose digit 3.  He demonstrates Rt inattention.  Moveement consistent with Brunnstrom stage V.  He can retrieve items at 100* shoulder elevation with good alignment of shoulder, but demonstrates decreased eccentric control of shoulder and elbow  RUE Sensation: decreased light touch, decreased proprioception RUE Coordination: decreased fine motor, decreased gross motor  Lower Extremity Assessment: Defer to PT evaluation RLE Deficits / Details: Grossly ~3+/5 throughout, ankle not assessed secondary to CAM boot and surgery. LLE Deficits / Details: Grossly ~3+/5 throughout, ankle not assessed secondary to post op shoe.    ADLs  Overall ADL's : Needs assistance/impaired Eating/Feeding: Set up, Sitting Eating/Feeding Details (indicate cue type and reason): using Lt UE (he is right hand dominant) Grooming: Wash/dry hands, Wash/dry face, Oral care, Brushing hair, Set up, Sitting Grooming Details (indicate cue type and reason): using Lt UE  Upper Body Bathing: Moderate assistance, Sitting Lower Body Bathing: Moderate assistance, Sit to/from stand Upper Body Dressing : Moderate assistance, Sitting Lower Body Dressing: Total assistance, Sit to/from stand Toilet Transfer: Minimal assistance, Stand-pivot, BSC, RW Toilet Transfer Details (indicate cue type and reason): Pt requires step by step cues for hand placement and sequencing.  He fails to place Rt UE on walker, then attempts to proceed one handedly moving RW  Toileting- Water quality scientist and Hygiene: Moderate assistance, Sit to/from stand Functional mobility during ADLs: Minimal assistance, Rolling walker General ADL Comments: Pt unable to maintain WBing status     Mobility  Overal bed mobility: Needs Assistance Bed Mobility: Supine to  Sit Supine to sit: Min guard, HOB elevated General  bed mobility comments: Pt sitting up in chair     Transfers  Overall transfer level: Needs assistance Equipment used: Rolling walker (2 wheeled) Transfers: Sit to/from Stand, Stand Pivot Transfers Sit to Stand: Min assist Stand pivot transfers: Min assist General transfer comment: Pt requires mod - max cues for proper hand placement and safety.  He requires step by step cues for technique as well as assist to move into standing and for balance as well as to control Rt UE     Ambulation / Gait / Stairs / Wheelchair Mobility  Ambulation/Gait Ambulation/Gait assistance:  (Non ambulatory at this time due to recenr foot reconstruction RLE and PWB)    Posture / Balance Balance Overall balance assessment: Needs assistance Sitting-balance support: Feet supported, No upper extremity supported Sitting balance-Leahy Scale: Fair Standing balance support: During functional activity, Bilateral upper extremity supported Standing balance-Leahy Scale: Poor Standing balance comment: requires min A     Special needs/care consideration BiPAP/CPAP No CPM No Continuous Drip IV 0.9% NS at 75/ml hr Dialysis No        Life Vest No Oxygen No Special Bed No Trach Size No Wound Vac (area) No     Skin Recent foot surgery and boot                            Bowel mgmt: Last BM 09/02/16 Bladder mgmt: Voiding in bathroom and in urinal Diabetic mgmt Yes, on oral medications at home    Previous Home Environment Living Arrangements: Alone  Lives With: Alone Available Help at Discharge: Family, Available PRN/intermittently (nephew lives across the street) Type of Home: Other(Comment) (condo) Home Layout: One level Home Access: Level entry Bathroom Shower/Tub: Walk-in Radio producer: [place]: Yes Type of Home Care Services: Home RN  Discharge Living Setting Plans for Discharge Living Setting: Alone, Other (Comment)  (Lives in a condominium.) Type of Home at Discharge: Other (Comment) (Condominium) Discharge Home Layout: One level Discharge Home Access: Level entry Does the patient have any problems obtaining your medications?: No  Social/Family/Support Systems Patient Roles: Other (Comment) (Has a nephew, a sister and friends.) Contact Information: Lawer[place] Bach - sister -  Anticipated Caregiver: self and nephew across the street, [employer]. Ability/Limitations of Caregiver: Alanson Puls works days and sister lives in [place], [place] Caregiver Availability: Intermittent Discharge Plan Discussed with Primary Caregiver: Yes Is Caregiver In Agreement with Plan?: Yes Does Caregiver/Family have Issues with Lodging/Transportation while Pt is in Rehab?: No  Goals/Additional Needs Patient/Family Goal for Rehab: PT/OT/SLP mod I goals Expected length of stay: 14-17 days Cultural Considerations: None Dietary Needs: Carb mod, med cal, thin liquids Equipment Needs: TBD Pt/Family Agrees to Admission and willing to participate: Yes Program Orientation Provided & Reviewed with Pt/Caregiver Including Roles  & Responsibilities: Yes  Decrease burden of Care through IP rehab admission: N/A  Possible need for SNF placement upon discharge:  Has no SNF benefits with fed BCBS.  However, has 80% VA benefits per patient.  Not anticipating SNF.  Patient Condition: This patient's condition remains as documented in the consult dated 09/01/16, in which the Rehabilitation Physician determined and documented that the patient's condition is appropriate for intensive rehabilitative care in an inpatient rehabilitation facility. Will admit to inpatient rehab today.  Preadmission Screen Completed By:  Aaron Moon, 09/02/2016 1:10 PM ______________________________________________________________________   Discussed status with Dr. Posey Pronto on 09/02/16 at 1254 and received telephone approval for admission today.  Admission  Coordinator:  Aaron Moon, time1310/Date01/24/18       Cosigned by:

## 2016-09-04 ENCOUNTER — Telehealth: Payer: Self-pay | Admitting: Podiatry

## 2016-09-04 ENCOUNTER — Inpatient Hospital Stay (HOSPITAL_COMMUNITY): Payer: Federal, State, Local not specified - PPO | Admitting: Physical Therapy

## 2016-09-04 ENCOUNTER — Inpatient Hospital Stay (HOSPITAL_COMMUNITY): Payer: Federal, State, Local not specified - PPO

## 2016-09-04 ENCOUNTER — Inpatient Hospital Stay (HOSPITAL_COMMUNITY): Payer: Federal, State, Local not specified - PPO | Admitting: Occupational Therapy

## 2016-09-04 ENCOUNTER — Inpatient Hospital Stay (HOSPITAL_COMMUNITY): Payer: Federal, State, Local not specified - PPO | Admitting: Speech Pathology

## 2016-09-04 DIAGNOSIS — S76311A Strain of muscle, fascia and tendon of the posterior muscle group at thigh level, right thigh, initial encounter: Secondary | ICD-10-CM

## 2016-09-04 LAB — GLUCOSE, CAPILLARY
GLUCOSE-CAPILLARY: 150 mg/dL — AB (ref 65–99)
Glucose-Capillary: 136 mg/dL — ABNORMAL HIGH (ref 65–99)
Glucose-Capillary: 132 mg/dL — ABNORMAL HIGH (ref 65–99)

## 2016-09-04 MED ORDER — CYCLOBENZAPRINE HCL 5 MG PO TABS
5.0000 mg | ORAL_TABLET | Freq: Three times a day (TID) | ORAL | Status: DC | PRN
Start: 2016-09-04 — End: 2016-09-17
  Administered 2016-09-04 – 2016-09-16 (×11): 5 mg via ORAL
  Filled 2016-09-04 (×12): qty 1

## 2016-09-04 MED ORDER — MUSCLE RUB 10-15 % EX CREA
TOPICAL_CREAM | Freq: Two times a day (BID) | CUTANEOUS | Status: DC | PRN
  Administered 2016-09-04 – 2016-09-07 (×2): via TOPICAL
  Filled 2016-09-04: qty 85

## 2016-09-04 NOTE — Progress Notes (Signed)
Physical Therapy Session Note  Patient Details  Name: Aaron Moon MRN: [id number] Date of Birth: September 17, 1945  Today's Date: 09/04/2016 PT Individual Time: 2334-3568 PT Individual Time Calculation (min): 41 min   Short Term Goals: Week 1:  PT Short Term Goal 1 (Week 1): pt will transfer with LRAD and min assist PT Short Term Goal 2 (Week 1): Pt will ambulate with LRAD x150' and min guard PT Short Term Goal 3 (Week 1): Pt will negotiate 4 steps with mod assist  Skilled Therapeutic Interventions/Progress Updates:    no c/o pain at rest.  Session focus on pt education regarding NWB status, transfers, standing tolerance, and w/c propulsion.    PT provided extensive education on NWB status and what that means for mobility (i.e. Foot not touching the floor).  Pt with apparent difficulty grasping concept and unable to demonstrate during mobility without significant assist.  Pt repeatedly stating "but with the boot there is padding between my foot and the floor," despite re-education for weight bearing restrictions.  PT did contact pt's surgeon to request clarification on order, since pt is 5.5 weeks out from his surgery and was supposed to have his pins taken out early next week.    Sit<>stand transfer from recliner with max assist to rise, balance, and position RLE onto knee walker.  Min assist for ambulation around room with knee walker and supervision for standing balance at sink during self care tasks.  Pt continues to be impulsive with poor safety awareness with all mobility.    W/C propulsion from room to therapy gym with overall supervision and increased time.  Squat/pivot w/c<>therapy mat (to the L both trials) with therapist maintaining NWB and providing max assist and max multimodal cues for forward weight shift, head/hips relationship, and use of UEs and LLE to lift bottom up and pivot.  Pt leaning back throughout transfer, and when verbally cued to lean forward leaned further back or to the  L.  Able to correct with manual facilitation.    Pt returned to room at end of session and left upright in w/c with call bell in reach and needs met.   Therapy Documentation Precautions:  Precautions Precautions: Fall Required Braces or Orthoses: Other Brace/Splint Other Brace/Splint: CAM boot RLE, post op shoe on left Restrictions Weight Bearing Restrictions: Yes RLE Weight Bearing: Non weight bearing Other Position/Activity Restrictions: Pt reports being able to use it for balance in standing but not to walk on it, however per PA, and most recent DPM note, NWB   See Function Navigator for Current Functional Status.   Therapy/Group: Individual Therapy  [name] 09/04/2016, 11:29 AM

## 2016-09-04 NOTE — Telephone Encounter (Addendum)
[facility] and is not scheduled to be discharged until 09/19/2016, what is he to do about his pins he is scheduled for possible pin removal in office 09/09/2016. Ms Sandria ManlyLove, PT - [facility] asked for clarification of pt's weight bearing status, she knows he is using the older S/P hind foot to transfer at home is that okay for PT. I informed pt that Dr. Logan BoresEvans had said to just get well and to get an appt as soon as he was out of the hospital. Pt states understanding and I told him I am still waiting on the answer about his weight bearing. 09/07/2016-Informed Delle ReiningPamela Love, PA PT that pt could transfer on heel of foot with pins.

## 2016-09-04 NOTE — Progress Notes (Addendum)
[facility] PHYSICAL MEDICINE & REHABILITATION     PROGRESS NOTE  Subjective/Complaints:  RIgth thigh pain behind knee preceded CVA, no back pain, no trauma to that area Questions regarding CVA etiology  ROS: Denies CP, SOB, N/V/D.  Objective: Vital Signs: Blood pressure 140/72, pulse 80, temperature 97.7 F (36.5 C), temperature source Oral, resp. rate 18, height 5\' 7"  (1.702 m), weight 115.8 kg (255 lb 4.7 oz), SpO2 97 %. No results found.  Recent Labs  09/03/16 0213  WBC 9.4  HGB 12.0*  HCT 37.4*  PLT 233    Recent Labs  09/03/16 0213  NA 136  K 4.0  CL 102  GLUCOSE 175*  BUN 13  CREATININE 1.19  CALCIUM 8.5*   CBG (last 3)   Recent Labs  09/03/16 1126 09/03/16 2120 09/04/16 0632  GLUCAP 139* 122* 132*    Wt Readings from Last 3 Encounters:  09/02/16 115.8 kg (255 lb 4.7 oz)  08/31/16 117.6 kg (259 lb 4.2 oz)  08/08/15 129.3 kg (285 lb)    Physical Exam:  BP 140/72 (BP Location: Left Arm)   Pulse 80   Temp 97.7 F (36.5 C) (Oral)   Resp 18   Ht 5\' 7"  (1.702 m)   Wt 115.8 kg (255 lb 4.7 oz)   SpO2 97%   BMI 39.98 kg/m  Constitutional: He appears well-developed. Obese  HENT: Normocephalic and atraumatic.  Eyes: EOM are normal. No discharge.  Cardiovascular: Normal rate and regular rhythm.  No JVD. Respiratory: Effort normal and breath sounds normal.  GI: Soft. Bowel sounds are normal. Obese  Musculoskeletal: He exhibits no edema or tenderness.  Neurological: He is alert and oriented.  Mild right facial weakness.  Speech clear.  Able to follow commands.  Motor: RUE: 4/5 proximal to distal, severe dysmetria and ataxia RLE: 5/5 proximally, boot distally LUE: 4+/5 proximal to distal LLE: 5/5 proximal to distal  Skin: Loop recorder site no evidence of hematoma, protruding K wires from distal toes on RIght no drainage Psychiatric: normal affect today   Assessment/Plan: 1. Functional deficits secondary to B/l embolic MCA infarcts which  require 3+ hours per day of interdisciplinary therapy in a comprehensive inpatient rehab setting. Physiatrist is providing close team supervision and 24 hour management of active medical problems listed below. Physiatrist and rehab team continue to assess barriers to discharge/monitor patient progress toward functional and medical goals.  Function:  Bathing Bathing position   Position: Wheelchair/chair at sink  Bathing parts Body parts bathed by patient: Right arm, Left arm, Chest, Abdomen Body parts bathed by helper: Buttocks, Back, Front perineal area  Bathing assist        Upper Body Dressing/Undressing Upper body dressing   What is the patient wearing?: Hospital gown                Upper body assist        Lower Body Dressing/Undressing Lower body dressing   What is the patient wearing?: Pants, AFO       Pants- Performed by helper: Thread/unthread right pants leg, Thread/unthread left pants leg, Pull pants up/down               AFO - Performed by helper: Don/doff right AFO, Don/doff left AFO (Rt CAM boot and Lt surgical shoe)      Lower body assist        Toileting Toileting   Toileting steps completed by patient: Adjust clothing prior to toileting, Performs perineal hygiene, Adjust clothing after  toileting Toileting steps completed by helper: Adjust clothing prior to toileting, Adjust clothing after toileting Toileting Assistive Devices: Grab bar or rail  Toileting assist Assist level: Touching or steadying assistance (Pt.75%)   Transfers Chair/bed transfer   Chair/bed transfer method: Squat pivot, Ambulatory Chair/bed transfer assist level: Moderate assist (Pt 50 - 74%/lift or lower) Chair/bed transfer assistive device: Bedrails, Patent attorneyWalker     Locomotion Ambulation     Max distance: 50 Assist level: Touching or steadying assistance (Pt > 75%)   Wheelchair   Type: Manual Max wheelchair distance: 150 Assist Level: Dependent (Pt equals 0%)   Cognition Comprehension Comprehension assist level: Understands basic 75 - 89% of the time/ requires cueing 10 - 24% of the time  Expression Expression assist level: Expresses basic 90% of the time/requires cueing < 10% of the time.  Social Interaction Social Interaction assist level: Interacts appropriately 90% of the time - Needs monitoring or encouragement for participation or interaction.  Problem Solving Problem solving assist level: Solves basic 75 - 89% of the time/requires cueing 10 - 24% of the time  Memory Memory assist level: Recognizes or recalls 75 - 89% of the time/requires cueing 10 - 24% of the time    Medical Problem List and Plan: 1.  Limitations in self care, mobility deficits secondary to B/l embolic MCA infarcts.  CIR PT, OT SLP 2.  DVT Prophylaxis/Anticoagulation: Pharmaceutical: Lovenox, resumed 3. Pain Management: tylenol prn.  4. Mood: LCSW to follow for evaluation and support.  Mild adjustment issues but denies anxiety/depression. Monitor for now.   5. Neuropsych: This patient is capable of making decisions on his own behalf. 6. Skin/Wound Care: routine pressure relief measures. Cleanse incision with normal saline and apply Gentamicin cream every other day. 7. Fluids/Electrolytes/Nutrition: Monitor I/O. Educate patient on heart healthy diet. 8. Right forefoot reconstruction: NWB with boot for support.   DPM note reviewed from 08/19/16, cont NWB and dressing change qod, recheck 4 wks 9. H/o Gout: Stable. Monitor for now . 10. Insomnia: managed by trazodone.  11.  New diagnosis Diabetes: Hgb A1c- 7.0. Consulted RD for education. Monitor BS ac/hs  Started low dose metformin.   Use SSI for elevated BS.   Monitor with increased activity 12. Labile blood pressure   Vitals:   09/03/16 1323 09/04/16 0409  BP: (!) 148/81 140/72  Pulse: 89 80  Resp: 18 18  Temp: 97.8 F (36.6 C) 97.7 F (36.5 C)   13. Hypoalbuminemia  Supplement initiated 1/25 14. ABLA- mild  Hb  12.0 on 1/25  Cont to monitor 15.  Hamstring strain RIght add muscle relaxer, kpad and sportscreme LOS (Days) 2 A FACE TO FACE EVALUATION WAS PERFORMED  Claudette LawsKIRSTEINS,ANDREW E 09/04/2016 7:24 AM

## 2016-09-04 NOTE — Progress Notes (Signed)
Speech Language Pathology Daily Session Note  Patient Details  Name: Aaron Moon MRN: [id number] Date of Birth: 04/23/1946  Today's Date: 09/04/2016 SLP Individual Time: 1105-1130 SLP Individual Time Calculation (min): 25 min  Short Term Goals: Week 1: SLP Short Term Goal 1 (Week 1): Patient will demonstrate functional problem solving with basic and familiar tasks with Min A verbal cues.  SLP Short Term Goal 2 (Week 1): Patient will utilize external memory aids to recall new, daily information with Min A verbal and visual cues.  SLP Short Term Goal 3 (Week 1): Patient will self-monitor and correct errors during functional tasks with Min A question cues.  SLP Short Term Goal 4 (Week 1): Patient will follow complex tasks/directions with supervision verbal cues.   Skilled Therapeutic Interventions: Skilled treatment session focused on cognitive goals. Patient was perseverative on right hamstring pain throughout session and required Min-Mod A verbal cues for redirection. SLP also facilitated session by providing Max A verbal cues for problem solving and recall during a basic money management task. Patient left upright in wheelchair with all needs within reach. Continue with current plan of care.      Function:  Cognition Comprehension Comprehension assist level: Understands basic 75 - 89% of the time/ requires cueing 10 - 24% of the time  Expression   Expression assist level: Expresses basic 90% of the time/requires cueing < 10% of the time.  Social Interaction Social Interaction assist level: Interacts appropriately 90% of the time - Needs monitoring or encouragement for participation or interaction.  Problem Solving Problem solving assist level: Solves basic 75 - 89% of the time/requires cueing 10 - 24% of the time  Memory Memory assist level: Recognizes or recalls 75 - 89% of the time/requires cueing 10 - 24% of the time    Pain Pain in right hamstring. RN aware and heat applied.    Therapy/Group: Individual Therapy  Mercedes Fort 09/04/2016, 3:13 PM

## 2016-09-04 NOTE — IPOC Note (Signed)
Overall Plan of Care [facility]) Patient Details Name: Aaron Moon MRN: [id number] DOB: Dec 18, 1945  Admitting Diagnosis: B CVA  Hospital Problems: Active Problems:   Acute ischemic stroke (HCC)   Acute ischemic left MCA stroke (HCC)   Status post placement of implantable loop recorder   Acute blood loss anemia   Hypoalbuminemia due to protein-calorie malnutrition (HCC)   Labile blood pressure     Functional Problem List: Nursing Bladder, Bowel, Edema, Endurance, Medication Management, Motor, Safety, Skin Integrity  PT Balance, Endurance, Motor, Safety  OT Balance, Cognition, Endurance, Motor, Pain, Safety  SLP    TR         Basic ADL's: OT Grooming, Bathing, Dressing, Toileting     Advanced  ADL's: OT       Transfers: PT Bed Mobility, Bed to Chair, Car, Occupational psychologist, Research scientist (life sciences): PT Stairs, Ambulation, Psychologist, prison and probation services     Additional Impairments: OT Fuctional Use of Upper Extremity  SLP Social Cognition, Communication comprehension, expression Problem Solving, Memory, Attention, Awareness, Social Interaction  TR      Anticipated Outcomes Item Anticipated Outcome  Self Feeding Mod I  Swallowing      Basic self-care  Marketing executive Transfers Supervision  Bowel/Bladder  Min assist  Transfers  supervision  Locomotion  supervision  Communication  Supervision   Cognition  Supervision  Pain  < 3  Safety/Judgment  Min assist   Therapy Plan: PT Intensity: Minimum of 1-2 x/day ,45 to 90 minutes PT Frequency: 5 out of 7 days PT Duration Estimated Length of Stay: 14-16 days OT Intensity: Minimum of 1-2 x/day, 45 to 90 minutes OT Frequency: 5 out of 7 days OT Duration/Estimated Length of Stay: 14-16 days SLP Intensity: Minumum of 1-2 x/day, 30 to 90 minutes SLP Frequency: 3 to 5 out of 7 days SLP Duration/Estimated Length of Stay: 14-16 days        Team Interventions: Nursing Interventions  Patient/Family Education, Bladder Management, Bowel Management, Medication Management, Cognitive Remediation/Compensation, Disease Management/Prevention, Skin Care/Wound Management, Discharge Planning  PT interventions Ambulation/gait training, Balance/vestibular training, Cognitive remediation/compensation, Discharge planning, DME/adaptive equipment instruction, Neuromuscular re-education, Functional mobility training, Pain management, Patient/family education, Psychosocial support, Splinting/orthotics, Stair training, Therapeutic Exercise, Therapeutic Activities, UE/LE Strength taining/ROM, UE/LE Coordination activities, Visual/perceptual remediation/compensation, Wheelchair propulsion/positioning  OT Interventions Balance/vestibular training, Cognitive remediation/compensation, Discharge planning, Disease mangement/prevention, DME/adaptive equipment instruction, Functional mobility training, Neuromuscular re-education, Pain management, Patient/family education, Psychosocial support, Self Care/advanced ADL retraining, Skin care/wound managment, Splinting/orthotics, Therapeutic Activities, Therapeutic Exercise, UE/LE Strength taining/ROM, UE/LE Coordination activities, Wheelchair propulsion/positioning  SLP Interventions Cognitive remediation/compensation, Cueing hierarchy, Functional tasks, Patient/family education, Therapeutic Activities, Internal/external aids, Speech/Language facilitation, Environmental controls  TR Interventions    SW/CM Interventions Discharge Planning, Psychosocial Support, Patient/Family Education    Team Discharge Planning: Destination: PT-Home ,OT- Home , SLP-Home Projected Follow-up: PT-Home health PT, 24 hour supervision/assistance, OT-  Home health OT, 24 hour supervision/assistance, SLP-24 hour supervision/assistance, Home Health SLP, Outpatient SLP Projected Equipment Needs: PT-To be determined, OT- 3 in 1 bedside comode, To be determined, SLP-  Equipment Details: PT- ,  OT-  Patient/family involved in discharge planning: PT- Patient,  OT-Patient, SLP-Patient  MD ELOS: 14-16 days Medical Rehab Prognosis:  Excellent Assessment: The patient has been admitted for CIR therapies with the diagnosis of bilateral CVA's. The team will be addressing functional mobility, strength, stamina, balance, safety, adaptive techniques and equipment, self-care, bowel and bladder mgt, patient and caregiver education, NMR, cognition,  visual-spatial awareness, community reintegration. Goals have been set at supervision for mobility, self-care and cognition/communication.    [name], MD, FAAPMR      See Team Conference Notes for weekly updates to the plan of care

## 2016-09-04 NOTE — Telephone Encounter (Signed)
Pt is in [facility] room 2, he wont get out until Feb 10, hes scheduled to come in to have his pins removed next week. Pt. Wondering what he should do.

## 2016-09-04 NOTE — Telephone Encounter (Signed)
Begin partial weight bearing in CAM boot for the next 2 weeks. After 09/18/16 begin full weight bearing in a CAM boot.

## 2016-09-04 NOTE — Progress Notes (Addendum)
Physical Therapy Note  Patient Details  Name: Aaron Moon Cragg MRN: [id number] Date of Birth: 1946-01-25 Today's Date: 09/04/2016  1435-1610, 95 min individual tx Pain: 8/10 R hamstrings, premedicated; iced at end of session  Pt stated he needed to use BR.  +2-3 for stand pivot recliner> w/c to L in order to maintain NWBing RLE.  Toilet transfer for continent voiding.  neuromuscular re-education via multimodal cues for 10 x 1 each:  standing in parallel bars: R hip abduction, R hamstrings curls, R hip ext with flexed knee; R tap onto 4" high step seated R hip flexion;  supine lower trunk rotation with ball between knees for core activation, R/L straight leg raises, R/L short arc quad knee ext.  LLE with 3# wt at ankle for supine ex.   Gait with R knee walker x 150' with min guard assist> min assist during turns due to impulsivity.   From raised mat, pt stood NWBing RLE to get RLE onto knee walker, with mod assist.  Gait to return to room with safer speed during turns, supervision.  Due to pt's R hamstring pain, PT recommended pt not rest in recliner (which is low) with RLE extended.  W/c or bed recommended. PT educated pt about cryotherapy via ice pack to R hamstring after session, 20 min on/off.  Pt pleased with this tx and able to direct care regarding placement.  PT informed Whitney, RN and Armeniahina, NT about use of ice. Pt left resting in w/c with all needs within reach.  See function navigator for current status.  Elisabel Hanover 09/04/2016, 2:34 PM

## 2016-09-04 NOTE — Progress Notes (Signed)
Occupational Therapy Session Note  Patient Details  Name: Aaron Moon MRN: [id number] Date of Birth: 04/09/1946  Today's Date: 09/04/2016 OT Individual Time: 1352-1420 OT Individual Time Calculation (min): 28 min    Short Term Goals: Week 1:  OT Short Term Goal 1 (Week 1): Pt will complete toilet transfers with min assist and LRAD OT Short Term Goal 2 (Week 1): Pt will complete sit > stand with mod assist while maintaining NWB status to complete LB dressing/hygiene OT Short Term Goal 3 (Week 1): Pt will don underwear and pants with mod assist OT Short Term Goal 4 (Week 1): Pt will utilize RUE during bathing and dressing with min cues to incorporate  Skilled Therapeutic Interventions/Progress Updates:    Treatment session with focus on adherence to NWB status and RUE NMR.  Upon arrival, pt in recliner chair with c/o weight bearing status.  Pt reports that he has progressed past the NWB status and should be allowed to place weight through his Rt foot with the boot on, attempted to explain current orders and MD rationale.  Pt expressing desire to hear back from his surgeon.  Pt requesting to address RUE incoordination and ataxia.  Engaged in fine motor control activities with focus on grasp with resistive clothespins as well as in-hand manipulation with pegs and small beads.  Noted overshooting with pegboard activity, requiring min cues to increase awareness.  Therapy Documentation Precautions:  Precautions Precautions: Fall Required Braces or Orthoses: Other Brace/Splint Other Brace/Splint: CAM boot RLE, post op shoe on left Restrictions Weight Bearing Restrictions: Yes RLE Weight Bearing: Non weight bearing Other Position/Activity Restrictions: Pt reports being able to use it for balance in standing but not to walk on it, however per PA NWB General:   Vital Signs: Therapy Vitals Temp: 98 F (36.7 C) Temp Source: Oral Pulse Rate: 95 Resp: 18 BP: 132/87 Patient Position (if  appropriate): Sitting Oxygen Therapy SpO2: 94 % O2 Device: Not Delivered Pain:  Pt with no c/o pain  See Function Navigator for Current Functional Status.   Therapy/Group: Individual Therapy  Rosalio LoudHOXIE, Charlott Calvario 09/04/2016, 3:26 PM

## 2016-09-04 NOTE — Telephone Encounter (Signed)
That's fine, he can just come in as soon as he is discharged. A few more weeks will be fine.  Dr. Logan BoresEvans.

## 2016-09-05 LAB — GLUCOSE, CAPILLARY
Glucose-Capillary: 132 mg/dL — ABNORMAL HIGH (ref 65–99)
Glucose-Capillary: 153 mg/dL — ABNORMAL HIGH (ref 65–99)
Glucose-Capillary: 164 mg/dL — ABNORMAL HIGH (ref 65–99)
Glucose-Capillary: 141 mg/dL — ABNORMAL HIGH (ref 65–99)

## 2016-09-05 NOTE — Progress Notes (Signed)
Aaron Moon is a 71 y.o. male 04-Dec-1945 [id number]  Subjective: No new complaints. No new problems. Slept well. Feeling OK.  Objective: Vital signs in last 24 hours: Temp:  [98 F (36.7 C)-98.2 F (36.8 C)] 98.2 F (36.8 C) (01/27 0610) Pulse Rate:  [71-95] 71 (01/27 0610) Resp:  [17-18] 17 (01/27 0610) BP: (132-139)/(87-92) 139/92 (01/27 0610) SpO2:  [94 %-97 %] 97 % (01/27 0610) Weight change:  Last BM Date: 09/04/16  Intake/Output from previous day: 01/26 0701 - 01/27 0700 In: 720 [P.O.:720] Out: -  Last cbgs: CBG (last 3)   Recent Labs  09/04/16 1150 09/05/16 0637 09/05/16 1153  GLUCAP 150* 132* 153*     Physical Exam  General: No apparent distress   HEENT: not dry Lungs: Normal effort. Lungs clear to auscultation, no crackles or wheezes. Cardiovascular: Regular rate and rhythm, no edema Abdomen: S/NT/ND; BS(+) Musculoskeletal:  unchanged Neurological: No new neurological deficits   Skin: clear  Aging changes Mental state: Alert, oriented, cooperative    Lab Results: BMET    Component Value Date/Time   NA 136 09/03/2016 0213   K 4.0 09/03/2016 0213   CL 102 09/03/2016 0213   CO2 21 (L) 09/03/2016 0213   GLUCOSE 175 (H) 09/03/2016 0213   BUN 13 09/03/2016 0213   CREATININE 1.19 09/03/2016 0213   CREATININE 1.09 07/22/2015 0909   CALCIUM 8.5 (L) 09/03/2016 0213   GFRNONAA >60 09/03/2016 0213   GFRNONAA 69 07/22/2015 0909   GFRAA >60 09/03/2016 0213   GFRAA 80 07/22/2015 0909   CBC    Component Value Date/Time   WBC 9.4 09/03/2016 0213   RBC 4.27 09/03/2016 0213   HGB 12.0 (L) 09/03/2016 0213   HCT 37.4 (L) 09/03/2016 0213   PLT 233 09/03/2016 0213   MCV 87.6 09/03/2016 0213   MCV 86.5 07/22/2015 0924   MCH 28.1 09/03/2016 0213   MCHC 32.1 09/03/2016 0213   RDW 14.0 09/03/2016 0213   LYMPHSABS 3.0 09/03/2016 0213   MONOABS 0.8 09/03/2016 0213   EOSABS 0.3 09/03/2016 0213   BASOSABS 0.0 09/03/2016 0213    Studies/Results: No  results found.  Medications: I have reviewed the patient's current medications.  Assessment/Plan:  1. CVA. Cont w/therapy 2. DVT proph - Lovenox 3. Pain Management: tylenol prn.  4. Mood: LCSW to follow for evaluation and support. Mild adjustment issues but denies anxiety/depression. Monitor for now.  5. Neuropsych: This patient is capable of making decisions on his own behalf. 6. Skin/Wound Care: routine pressure relief measures. Cleanse incision with normal saline and apply Gentamicin cream every other day. 7. Fluids/Electrolytes/Nutrition: Monitor I/O. Educate patient on heart healthy diet. 8. S/p Right forefoot reconstruction 9. H/o Gout: Stable. Monitor for now . 10. Insomnia. On Trazodone  11.DM. On Metformin 12. Labile blood pressure 13. Hypoalbuminemia             Supplement initiated 1/25 14. ABLA- mild             Hb 12.0 on 1/25             Cont to monitor 15. R hamstring strain - K pad LOS (Days) 2     Length of stay, days: 3  Sonda PrimesAlex Plotnikov , MD 09/05/2016, 2:14 PM

## 2016-09-06 ENCOUNTER — Inpatient Hospital Stay (HOSPITAL_COMMUNITY): Payer: Federal, State, Local not specified - PPO | Admitting: Occupational Therapy

## 2016-09-06 ENCOUNTER — Inpatient Hospital Stay (HOSPITAL_COMMUNITY): Payer: Federal, State, Local not specified - PPO | Admitting: *Deleted

## 2016-09-06 LAB — GLUCOSE, CAPILLARY
GLUCOSE-CAPILLARY: 140 mg/dL — AB (ref 65–99)
GLUCOSE-CAPILLARY: 178 mg/dL — AB (ref 65–99)
Glucose-Capillary: 151 mg/dL — ABNORMAL HIGH (ref 65–99)
Glucose-Capillary: 173 mg/dL — ABNORMAL HIGH (ref 65–99)

## 2016-09-06 NOTE — Progress Notes (Signed)
Fissure noted at top of gluteal folds. Foam applied. Will report to oncoming RN.

## 2016-09-06 NOTE — Progress Notes (Signed)
Physical Therapy Session Note  Patient Details  Name: Aaron Moon MRN: [id number] Date of Birth: Oct 21, 1945  Today's Date: 09/06/2016 PT Individual Time: 0800-0900 PT Individual Time Calculation (min): 60 min     Skilled Therapeutic Interventions/Progress Updates:  Patient sitting in a w/c , agrees to therapy intervention, complains of inability to bear weight on R LE and does not understand why he can not try. Extensive education provided. Nursing requested to train and establish best way to transfer to the commode.  Session initiated with transfer back to bed and manual interventions to reduce discomfort in R hamstring area with soft tissue mobilization.  Short distance gait training with scooter, min A and verbal cues for safety.  Training in transfers sit to stand x 10 with RW in front w/o weight on R LE. Initially with min to mod A, progressing to supervision ( mat at 19 inches).  Training in stand pivot from mat to chair with RW with no weight on R , min A, repeated x 6 to assure compliance.  Patient returned to room , ambulated with scooter. Educated on safety.  All needs within reach, positioned in bed with alarm on.    Therapy Documentation Precautions:  Precautions Precautions: Fall Required Braces or Orthoses: Other Brace/Splint Other Brace/Splint: CAM boot RLE, post op shoe on left Restrictions Weight Bearing Restrictions: Yes RLE Weight Bearing: Non weight bearing Other Position/Activity Restrictions: Pt reports being able to use it for balance in standing but not to walk on it, however per PA NWB   See Function Navigator for Current Functional Status.   Therapy/Group: Individual Therapy  Dorna MaiCzajkowska, Yancey Pedley W 09/06/2016, 12:09 PM

## 2016-09-06 NOTE — Progress Notes (Signed)
Occupational Therapy Session Note  Patient Details  Name: Aaron Moon Cedar MRN: [id number] Date of Birth: 1946/04/16  Today's Date: 09/06/2016 OT Individual Time: [id number] and  1335-1420  OT Individual Time Calculation (min): 58 min and 45 min   Short Term Goals: Week 1:  OT Short Term Goal 1 (Week 1): Pt will complete toilet transfers with min assist and LRAD OT Short Term Goal 2 (Week 1): Pt will complete sit > stand with mod assist while maintaining NWB status to complete LB dressing/hygiene OT Short Term Goal 3 (Week 1): Pt will don underwear and pants with mod assist OT Short Term Goal 4 (Week 1): Pt will utilize RUE during bathing and dressing with min cues to incorporate  Skilled Therapeutic Interventions/Progress Updates: Pt was at EOB with RN present at time of arrival, about to complete toilet transfer. Per earlier collaboration with PT, step pivot transfer utilized with RW to drop arm commode with pt elevating R LE off of floor with mod cues. Post op shoe also donned. Pt was able to adhere to NWB while standing with RW while therapist completed pericare. Due to soiled pants, pt donned new pants w/c level at sink. After he threaded CAM boot into pants, fabric seam ripped along leg to knee. RN notified to complete laundry due to this being pts only clean pants. New shirt also donned with supervision.Shaving completed with focus on R UE NMR, with pt instructed on using R UE as stabilizer and assist with applying shaving cream. L UE used for shaving for safety. Afterwards pt was left in w/c with all needs within reach.   2nd Session 1:1 tx (45 min) Pt was lying in bed at time of arrival, agreeable to tx. Tx focus on toilet transfers and R UE NMR. Pt practiced stand pivot transfer with RW, as well as lateral scoot/squat pivot transfers to drop arm commode (all with Mod A). Max cues on technique with education to continue due to cognitive deficts. Pt did well with NWB precautions during  transfers with mod cueing. Afterwards pt propelled w/c to nursing station for R UE NMR, was then taken to gym by OT and engaged in theraputty exercises with max cues and encouragement. Pt then propelled halfway back to room and was taken by OT for remainder of way for time. He completed stand pivot with RW and Mod A back to bed. Pt was repositioned for comfort and left with all needs within reach and bed alarm activated at time of departure.       Therapy Documentation Precautions:  Precautions Precautions: Fall Required Braces or Orthoses: Other Brace/Splint Other Brace/Splint: CAM boot RLE, post op shoe on left Restrictions Weight Bearing Restrictions: Yes RLE Weight Bearing: Non weight bearing Other Position/Activity Restrictions: Pt reports being able to use it for balance in standing but not to walk on it, however per PA NWB General:   Vital Signs: Therapy Vitals Temp: 98.6 F (37 C) Temp Source: Oral Pulse Rate: 99 Resp: 18 BP: 130/71 Patient Position (if appropriate): Lying Oxygen Therapy SpO2: 99 % O2 Device: Not Delivered Pain: No c/o pain during sessions    ADL:  :    See Function Navigator for Current Functional Status.   Therapy/Group: Individual Therapy  Jalen Oberry A Kadan Millstein 09/06/2016, 2:59 PM

## 2016-09-06 NOTE — Telephone Encounter (Signed)
Are you talking about Arvin Collarddgar Love or Doug SouDana Viall (your message mentions PT-Ms Love)? Yes, both can transfer on the hindfoot.  Dr. Logan BoresEvans.

## 2016-09-06 NOTE — Progress Notes (Signed)
Aaron Moon is a 71 y.o. male [birth year] [id number]  Subjective: No new complaints. No new problems. In therapy now (OT). Feeling OK.  Objective: Vital signs in last 24 hours: Temp:  [97.3 F (36.3 C)-99.3 F (37.4 C)] 98.7 F (37.1 C) (01/28 0500) Pulse Rate:  [95] 95 (01/27 1500) Resp:  [18-20] 20 (01/28 0500) BP: (110-128)/(56-75) 128/75 (01/28 0500) SpO2:  [95 %-99 %] 99 % (01/28 0500) Weight change:  Last BM Date: 09/05/16  Intake/Output from previous day: 01/27 0701 - 01/28 0700 In: 480 [P.O.:480] Out: 925 [Urine:925] Last cbgs: CBG (last 3)   Recent Labs  09/05/16 1629 09/05/16 2038 09/06/16 0653  GLUCAP 164* 141* 151*     Physical Exam General: No apparent distress   HEENT: not dry Lungs: Normal effort. Lungs clear to auscultation, no crackles or wheezes. Cardiovascular: Regular rate and rhythm, no edema Abdomen: S/NT/ND; BS(+) Musculoskeletal:  unchanged Neurological: No new neurological deficits Wounds: N/A    Skin: clear  Aging changes Mental state: Alert, oriented, cooperative    Lab Results: BMET    Component Value Date/Time   NA 136 09/03/2016 0213   K 4.0 09/03/2016 0213   CL 102 09/03/2016 0213   CO2 21 (L) 09/03/2016 0213   GLUCOSE 175 (H) 09/03/2016 0213   BUN 13 09/03/2016 0213   CREATININE 1.19 09/03/2016 0213   CREATININE 1.09 07/22/2015 0909   CALCIUM 8.5 (L) 09/03/2016 0213   GFRNONAA >60 09/03/2016 0213   GFRNONAA 69 07/22/2015 0909   GFRAA >60 09/03/2016 0213   GFRAA 80 07/22/2015 0909   CBC    Component Value Date/Time   WBC 9.4 09/03/2016 0213   RBC 4.27 09/03/2016 0213   HGB 12.0 (L) 09/03/2016 0213   HCT 37.4 (L) 09/03/2016 0213   PLT 233 09/03/2016 0213   MCV 87.6 09/03/2016 0213   MCV 86.5 07/22/2015 0924   MCH 28.1 09/03/2016 0213   MCHC 32.1 09/03/2016 0213   RDW 14.0 09/03/2016 0213   LYMPHSABS 3.0 09/03/2016 0213   MONOABS 0.8 09/03/2016 0213   EOSABS 0.3 09/03/2016 0213   BASOSABS 0.0 09/03/2016  0213    Studies/Results: No results found.  Medications: I have reviewed the patient's current medications.  A/P:  1. CVA. Continue with inpatient rehabilitation services 2. DVT prophylaxis with Lovenox 3. History of gout. No relapse. 4. Insomnia. Continue with trazodone. 5. Diabetes mellitus type 2. Cont with metformin 6. Labile blood pressure. Will continue to watch. 7. Right hamstring strain. Continue with K pad   Length of stay, days: 4  Sonda PrimesAlex Plotnikov , MD 09/06/2016, 10:08 AM

## 2016-09-06 NOTE — Progress Notes (Signed)
Physical Therapy Session Note  Patient Details  Name: Doug SouDana Neidig MRN: [id number] Date of Birth: Dec 29, 1945  Today's Date: 09/06/2016 PT Individual Time: 1300-1330 PT Individual Time Calculation (min): 30 min    Skilled Therapeutic Interventions/Progress Updates:  Patient complains of slight pain in R Hamstring, but agrees to therapy intervention. Session initiated with training in sit to stand and standing with RW and no WB on R , patient able to perform supported standing 3 x 2 min with cues for weight distribution, posture and continuation of activity, difficulty with controled descant to w/c.  Stand pivot transfer with RW to bed-min A.  Therapeutic exercises in supine to R LE in all planes w/o additional resistance.  Patient asked for the pain ointment to be applied to hamstring for pain reduction.  At the end of therapy left in bed with alarm and all needs within reach .   Therapy Documentation Precautions:  Precautions Precautions: Fall Required Braces or Orthoses: Other Brace/Splint Other Brace/Splint: CAM boot RLE, post op shoe on left Restrictions Weight Bearing Restrictions: Yes RLE Weight Bearing: Non weight bearing Other Position/Activity Restrictions: Pt reports being able to use it for balance in standing but not to walk on it, however per PA NWB   See Function Navigator for Current Functional Status.   Therapy/Group: Individual Therapy  Dorna MaiCzajkowska, Shai Rasmussen W 09/06/2016, 3:43 PM

## 2016-09-07 ENCOUNTER — Inpatient Hospital Stay (HOSPITAL_COMMUNITY): Payer: Federal, State, Local not specified - PPO | Admitting: Physical Therapy

## 2016-09-07 ENCOUNTER — Telehealth: Payer: Self-pay | Admitting: *Deleted

## 2016-09-07 ENCOUNTER — Inpatient Hospital Stay (HOSPITAL_COMMUNITY): Payer: Federal, State, Local not specified - PPO | Admitting: Occupational Therapy

## 2016-09-07 ENCOUNTER — Inpatient Hospital Stay (HOSPITAL_COMMUNITY): Payer: Federal, State, Local not specified - PPO | Admitting: Speech Pathology

## 2016-09-07 LAB — GLUCOSE, CAPILLARY
GLUCOSE-CAPILLARY: 121 mg/dL — AB (ref 65–99)
GLUCOSE-CAPILLARY: 151 mg/dL — AB (ref 65–99)
Glucose-Capillary: 136 mg/dL — ABNORMAL HIGH (ref 65–99)
Glucose-Capillary: 131 mg/dL — ABNORMAL HIGH (ref 65–99)
Glucose-Capillary: 170 mg/dL — ABNORMAL HIGH (ref 65–99)
Glucose-Capillary: 152 mg/dL — ABNORMAL HIGH (ref 65–99)

## 2016-09-07 NOTE — Progress Notes (Signed)
Occupational Therapy Session Note  Patient Details  Name: Aaron Moon MRN: [id number] Date of Birth: 06/27/1946  Today's Date: 09/07/2016 OT Individual Time: 1000-1100 OT Individual Time Calculation (min): 60 min    Short Term Goals: Week 1:  OT Short Term Goal 1 (Week 1): Pt will complete toilet transfers with min assist and LRAD OT Short Term Goal 2 (Week 1): Pt will complete sit > stand with mod assist while maintaining NWB status to complete LB dressing/hygiene OT Short Term Goal 3 (Week 1): Pt will don underwear and pants with mod assist OT Short Term Goal 4 (Week 1): Pt will utilize RUE during bathing and dressing with min cues to incorporate  Skilled Therapeutic Interventions/Progress Updates:    Treatment session with focus on transfers, w/c mobility, and RUE NMR.  Completed sit > stand and stand pivot transfer from bed > w/c with weight bearing only through heel on RLE (per MD note/telephone encounter) with min assist to power up and then pivot to w/c with use of RW.  Pt deferred bathing and dressing at this time, however completed grooming tasks in sitting at sink.  Pt propelled w/c approx 50 feet with cues for use of RUE.  Engaged in RUE NMR with focus on gross and fine motor control with reaching in to various planes.  Pt demonstrating increased ataxia when reaching up and out to Rt.  Provided education to visually attend to movements to increase motor control.  Encouraged pt to continue to incorporate RUE into functional tasks, ie. Grooming and self-feeding.  Therapy Documentation Precautions:  Precautions Precautions: Fall Required Braces or Orthoses: Other Brace/Splint Other Brace/Splint: CAM boot RLE, post op shoe on left Restrictions Weight Bearing Restrictions: Yes RLE Weight Bearing: Non weight bearing Other Position/Activity Restrictions: Pt reports being able to use it for balance in standing but not to walk on it, however per PA NWB General:   Vital  Signs: Therapy Vitals Temp: 97.7 F (36.5 C) Temp Source: Oral Pulse Rate: 99 Resp: 18 BP: 124/78 Patient Position (if appropriate): Sitting Oxygen Therapy SpO2: 95 % O2 Device: Not Delivered Pain:  Pt with no c/o pain  See Function Navigator for Current Functional Status.   Therapy/Group: Individual Therapy  Rosalio LoudHOXIE, Arles Rumbold 09/07/2016, 3:03 PM

## 2016-09-07 NOTE — Progress Notes (Signed)
[facility] PHYSICAL MEDICINE & REHABILITATION     PROGRESS NOTE  Subjective/Complaints:   Pt without new issues, R hanmstring pain improving  ROS: Denies CP, SOB, N/V/D.  Objective: Vital Signs: Blood pressure 123/71, pulse 94, temperature 99 F (37.2 C), temperature source Oral, resp. rate 18, height 5\' 7"  (1.702 m), weight 115.8 kg (255 lb 4.7 oz), SpO2 97 %. No results found. No results for input(s): WBC, HGB, HCT, PLT in the last 72 hours. No results for input(s): NA, K, CL, GLUCOSE, BUN, CREATININE, CALCIUM in the last 72 hours.  Invalid input(s): CO CBG (last 3)   Recent Labs  09/06/16 1655 09/06/16 2029 09/07/16 0649  GLUCAP 173* 178* 151*    Wt Readings from Last 3 Encounters:  09/02/16 115.8 kg (255 lb 4.7 oz)  08/31/16 117.6 kg (259 lb 4.2 oz)  08/08/15 129.3 kg (285 lb)    Physical Exam:  BP 123/71 (BP Location: Left Arm)   Pulse 94   Temp 99 F (37.2 C) (Oral)   Resp 18   Ht 5\' 7"  (1.702 m)   Wt 115.8 kg (255 lb 4.7 oz)   SpO2 97%   BMI 39.98 kg/m  Constitutional: He appears well-developed. Obese  HENT: Normocephalic and atraumatic.  Eyes: EOM are normal. No discharge.  Cardiovascular: Normal rate and regular rhythm.  No JVD. Respiratory: Effort normal and breath sounds normal.  GI: Soft. Bowel sounds are normal. Obese  Musculoskeletal: He exhibits no edema or tenderness.  Neurological: He is alert and oriented.  Mild right facial weakness.  Speech clear.  Able to follow commands.  Motor: RUE: 4/5 proximal to distal, severe dysmetria and ataxia RLE: 5/5 proximally, boot distally LUE: 4+/5 proximal to distal LLE: 5/5 proximal to distal  Skin: Loop recorder site no evidence of hematoma, protruding K wires from distal toes on RIght no drainage Psychiatric: normal affect today   Assessment/Plan: 1. Functional deficits secondary to B/l embolic MCA infarcts which require 3+ hours per day of interdisciplinary therapy in a comprehensive inpatient  rehab setting. Physiatrist is providing close team supervision and 24 hour management of active medical problems listed below. Physiatrist and rehab team continue to assess barriers to discharge/monitor patient progress toward functional and medical goals.  Function:  Bathing Bathing position   Position: Wheelchair/chair at sink  Bathing parts Body parts bathed by patient: Right arm, Left arm, Chest, Abdomen Body parts bathed by helper: Buttocks, Back, Front perineal area  Bathing assist        Upper Body Dressing/Undressing Upper body dressing   What is the patient wearing?: Pull over shirt/dress     Pull over shirt/dress - Perfomed by patient: Thread/unthread right sleeve, Thread/unthread left sleeve, Put head through opening, Pull shirt over trunk          Upper body assist Assist Level: Supervision or verbal cues      Lower Body Dressing/Undressing Lower body dressing   What is the patient wearing?: Pants, AFO, Non-skid slipper socks     Pants- Performed by patient: Thread/unthread right pants leg, Thread/unthread left pants leg Pants- Performed by helper: Pull pants up/down               AFO - Performed by helper: Don/doff left AFO (left post op shoe)      Lower body assist Assist for lower body dressing: Touching or steadying assistance (Pt > 75%)      Toileting Toileting   Toileting steps completed by patient: Adjust clothing prior to  toileting, Adjust clothing after toileting Toileting steps completed by helper: Adjust clothing prior to toileting, Performs perineal hygiene, Adjust clothing after toileting Toileting Assistive Devices: Grab bar or rail  Toileting assist Assist level: Touching or steadying assistance (Pt.75%), Two helpers (one helper, mod-max assist)   Transfers Chair/bed transfer   Chair/bed transfer method: Squat pivot Chair/bed transfer assist level: 2 helpers Chair/bed transfer assistive device: Walker (knee)      Locomotion Ambulation     Max distance: 150 Assist level: Touching or steadying assistance (Pt > 75%)   Wheelchair   Type: Manual Max wheelchair distance: 150 Assist Level: Supervision or verbal cues  Cognition Comprehension Comprehension assist level: Understands basic 75 - 89% of the time/ requires cueing 10 - 24% of the time  Expression Expression assist level: Expresses basic 75 - 89% of the time/requires cueing 10 - 24% of the time. Needs helper to occlude trach/needs to repeat words.  Social Interaction Social Interaction assist level: Interacts appropriately 90% of the time - Needs monitoring or encouragement for participation or interaction.  Problem Solving Problem solving assist level: Solves basic 75 - 89% of the time/requires cueing 10 - 24% of the time  Memory Memory assist level: Recognizes or recalls 75 - 89% of the time/requires cueing 10 - 24% of the time    Medical Problem List and Plan: 1.  Limitations in self care, mobility deficits secondary to B/l embolic MCA infarcts.  CIR PT, OT SLP 2.  DVT Prophylaxis/Anticoagulation: Pharmaceutical: Lovenox, resumed , on ASA for CVA prophyllaxis  , normal LE dopplers                3. Pain Management: tylenol prn.  4. Mood: LCSW to follow for evaluation and support.  Mild adjustment issues but denies anxiety/depression. Monitor for now.   5. Neuropsych: This patient is capable of making decisions on his own behalf. 6. Skin/Wound Care: routine pressure relief measures. Cleanse incision with normal saline and apply Gentamicin cream every other day. 7. Fluids/Electrolytes/Nutrition: Monitor I/O. Educate patient on heart healthy diet. 8. Right forefoot reconstruction: NWB with boot for support.   DPM note reviewed from 08/19/16, cont NWB and dressing change qod, recheck 4 wks, pt states that his f/u appt was 1/31, will check with podiatry office to clarify WB restriction during and pin removal recs 9. H/o Gout: Stable. Monitor for  now . 10. Insomnia: managed by trazodone.  11.  New diagnosis Diabetes: Hgb A1c- 7.0. Consulted RD for education. Monitor BS ac/hs  Started low dose metformin.   Use SSI for elevated BS.   Monitor with increased activity 12. Labile blood pressure, Now improving   Vitals:   09/06/16 1439 09/07/16 0522  BP: 130/71 123/71  Pulse: 99 94  Resp: 18 18  Temp: 98.6 F (37 C) 99 F (37.2 C)   13. Hypoalbuminemia  Supplement initiated 1/25 14. ABLA- mild  Hb 12.0 on 1/25  Cont to monitor 15.  Hamstring strain improving muscle relaxer, stretching with PT, sportscreme LOS (Days) 5 A FACE TO FACE EVALUATION WAS PERFORMED  Shoshanna Mcquitty E 09/07/2016 7:07 AM

## 2016-09-07 NOTE — Progress Notes (Signed)
Physical Therapy Session Note  Patient Details  Name: Aaron Moon MRN: [id number] Date of Birth: [birth year]  Today's Date: 09/07/2016 PT Individual Time: 1500-1530 PT Individual Time Calculation (min): 30 min   Short Term Goals: Week 1:  PT Short Term Goal 1 (Week 1): pt will transfer with LRAD and min assist PT Short Term Goal 2 (Week 1): Pt will ambulate with LRAD x150' and min guard PT Short Term Goal 3 (Week 1): Pt will negotiate 4 steps with mod assist  Skilled Therapeutic Interventions/Progress Updates:    no c/o pain.  Session focus on transfers and ambulation with knee scooter and pt education.  Pt transfers sit<>stand from w/c and from therapy mat with knee scooter with step-step verbal cues and overall steady assist for transfer.  Ambulation to and from therapy gym with knee scooter with close supervision, steady assist on turns due to speed.  Pt required seated rest break after arriving in therapy gym.  PT provided written instructions for sit<>stand transfer using knee scooter for over pt's bed and alerted nursing staff to their location.  Pt returned to room at end of session and positioned in w/c with call bell in reach and needs met.   Therapy Documentation Precautions:  Precautions Precautions: Fall Required Braces or Orthoses: Other Brace/Splint Other Brace/Splint: CAM boot RLE, post op shoe on left Restrictions Weight Bearing Restrictions: Yes RLE Weight Bearing: Non weight bearing Other Position/Activity Restrictions: Pt reports being able to use it for balance in standing but not to walk on it, however per PA NWB   See Function Navigator for Current Functional Status.   Therapy/Group: Individual Therapy  Earnest Conroy Penven-Crew 09/07/2016, 3:34 PM

## 2016-09-07 NOTE — Telephone Encounter (Signed)
"  [facility].  I don't know how I'm going to come to surgery on the 31st unless Dr. Logan BoresEvans comes over here.  I don't know what else to tell you."

## 2016-09-07 NOTE — Progress Notes (Signed)
Speech Language Pathology Daily Session Note  Patient Details  Name: Aaron Moon MRN: [id number] Date of Birth: [birth year]  Today's Date: 09/07/2016 SLP Individual Time: 1400-1445 SLP Individual Time Calculation (min): 45 min  Short Term Goals: Week 1: SLP Short Term Goal 1 (Week 1): Patient will demonstrate functional problem solving with basic and familiar tasks with Min A verbal cues.  SLP Short Term Goal 2 (Week 1): Patient will utilize external memory aids to recall new, daily information with Min A verbal and visual cues.  SLP Short Term Goal 3 (Week 1): Patient will self-monitor and correct errors during functional tasks with Min A question cues.  SLP Short Term Goal 4 (Week 1): Patient will follow complex tasks/directions with supervision verbal cues.   Skilled Therapeutic Interventions: Skilled treatment session focused on cognition goals. SLP facilitated session by providing Mod A verbal cues for new learning of card game (Blink). Pt required Min A for problem solving when taking structured turns within game. Pt was returned to room, left upright in wheelchair with all needs within reach. Continue per current plan of care.      Function:   Cognition Comprehension Comprehension assist level: Understands basic 75 - 89% of the time/ requires cueing 10 - 24% of the time  Expression   Expression assist level: Expresses basic 75 - 89% of the time/requires cueing 10 - 24% of the time. Needs helper to occlude trach/needs to repeat words.  Social Interaction Social Interaction assist level: Interacts appropriately 90% of the time - Needs monitoring or encouragement for participation or interaction.  Problem Solving Problem solving assist level: Solves basic 75 - 89% of the time/requires cueing 10 - 24% of the time  Memory Memory assist level: Recognizes or recalls 75 - 89% of the time/requires cueing 10 - 24% of the time    Pain    Therapy/Group: Individual Therapy Michela Herst B.  Dreama Saaverton, M.S., CCC-SLP Speech-Language Pathologist  [name] 09/07/2016, 3:48 PM

## 2016-09-07 NOTE — Progress Notes (Signed)
Physical Therapy Session Note  Patient Details  Name: Aaron Moon MRN: [id number] Date of Birth: 10-03-1945  Today's Date: 09/07/2016 PT Individual Time: 1100-1155 PT Individual Time Calculation (min): 55 min   Short Term Goals: Week 1:  PT Short Term Goal 1 (Week 1): pt will transfer with LRAD and min assist PT Short Term Goal 2 (Week 1): Pt will ambulate with LRAD x150' and min guard PT Short Term Goal 3 (Week 1): Pt will negotiate 4 steps with mod assist  Skilled Therapeutic Interventions/Progress Updates:    no c/o pain.    Session focus on pt education, activity tolerance, transfers, and standing balance.  Pt continues to demonstrate significant impairments in safety awareness and problem solving which make him a high fall risk.    Pt propelled w/c to therapy gym with supervision.  Blocked practice sit<>stand from w/c initially mod, fade to min, assist focus on use of UEs to power up, maintaining weight bearing only through heel on RLE, forward weight shift, and attaining/maintaining balance.  Progress to sit<>stand using knee scooter focus on step by step cues to ensure safe transfer as pt has significant deficits in safety awareness.  On initial attempt to return to sitting, pt with significant LOB requiring assist to safely return to chair.  After repeated practice sit<>stand and sit<>stand with knee scooter, pt able to demonstrate with steady assist.  Pt requesting to return to room for void/BM, total assist back to room in w/c for urgency.  Stand/pivot from w/c<>toilet with grab bars and mod verbal cues for sequencing.  Pt requires total assist for hygiene but able to manage clothing.  Pt positioned upright in w/c at end of session with ice applied to R hamstring, call bell in reach and needs met.    Therapy Documentation Precautions:  Precautions Precautions: Fall Required Braces or Orthoses: Other Brace/Splint Other Brace/Splint: CAM boot RLE, post op shoe on  left Restrictions Weight Bearing Restrictions: Yes RLE Weight Bearing: Non weight bearing Other Position/Activity Restrictions: WBAT through R heel for transfers only  See Function Navigator for Current Functional Status.   Therapy/Group: Individual Therapy  Aaron Moon 09/07/2016, 11:56 AM

## 2016-09-07 NOTE — Progress Notes (Signed)
Received call from Dr. Logan BoresEvans nurse--Ok for patient to put weight on right heel for transfers. OK for pins to stay in for few more weeks --to follow up after discharge.

## 2016-09-08 ENCOUNTER — Inpatient Hospital Stay (HOSPITAL_COMMUNITY): Payer: Federal, State, Local not specified - PPO | Admitting: Occupational Therapy

## 2016-09-08 ENCOUNTER — Inpatient Hospital Stay (HOSPITAL_COMMUNITY): Payer: Federal, State, Local not specified - PPO | Admitting: Physical Therapy

## 2016-09-08 ENCOUNTER — Inpatient Hospital Stay (HOSPITAL_COMMUNITY): Payer: Federal, State, Local not specified - PPO | Admitting: Speech Pathology

## 2016-09-08 LAB — GLUCOSE, CAPILLARY
GLUCOSE-CAPILLARY: 142 mg/dL — AB (ref 65–99)
GLUCOSE-CAPILLARY: 175 mg/dL — AB (ref 65–99)
GLUCOSE-CAPILLARY: 192 mg/dL — AB (ref 65–99)
GLUCOSE-CAPILLARY: 162 mg/dL — AB (ref 65–99)

## 2016-09-08 MED FILL — Alteplase For Inj 100 MG: INTRAVENOUS | Qty: 100 | Status: AC

## 2016-09-08 NOTE — Progress Notes (Signed)
[place] PHYSICAL MEDICINE & REHABILITATION     PROGRESS NOTE  Subjective/Complaints:   Foot hurting on right, Ace wrap removed and this relieved pain c/o  ROS: Denies CP, SOB, N/V/D.  Objective: Vital Signs: Blood pressure 138/84, pulse 99, temperature 98.6 F (37 C), temperature source Oral, resp. rate 18, height 5\' 7"  (1.702 m), weight 115.8 kg (255 lb 4.7 oz), SpO2 96 %. No results found. No results for input(s): WBC, HGB, HCT, PLT in the last 72 hours. No results for input(s): NA, K, CL, GLUCOSE, BUN, CREATININE, CALCIUM in the last 72 hours.  Invalid input(s): CO CBG (last 3)   Recent Labs  09/07/16 1654 09/07/16 2032 09/08/16 0630  GLUCAP 170* 152* 142*    Wt Readings from Last 3 Encounters:  09/02/16 115.8 kg (255 lb 4.7 oz)  08/31/16 117.6 kg (259 lb 4.2 oz)  08/08/15 129.3 kg (285 lb)    Physical Exam:  BP 138/84 (BP Location: Left Arm)   Pulse 99   Temp 98.6 F (37 C) (Oral)   Resp 18   Ht 5\' 7"  (1.702 m)   Wt 115.8 kg (255 lb 4.7 oz)   SpO2 96%   BMI 39.98 kg/m  Constitutional: He appears well-developed. Obese  HENT: Normocephalic and atraumatic.  Eyes: EOM are normal. No discharge.  Cardiovascular: Normal rate and regular rhythm.  No JVD. Respiratory: Effort normal and breath sounds normal.  GI: Soft. Bowel sounds are normal. Obese  Musculoskeletal: He exhibits no edema or tenderness.  Neurological: He is alert and oriented.  Mild right facial weakness.  Speech clear.  Able to follow commands.  Motor: RUE: 4/5 proximal to distal, severe dysmetria and ataxia RLE: 5/5 proximally, boot distally LUE: 4+/5 proximal to distal LLE: 5/5 proximal to distal  Skin: Loop recorder site no evidence of hematoma, protruding K wires from distal toes on RIght no drainage Psychiatric: normal affect today   Assessment/Plan: 1. Functional deficits secondary to B/l embolic MCA infarcts which require 3+ hours per day of interdisciplinary therapy in a  comprehensive inpatient rehab setting. Physiatrist is providing close team supervision and 24 hour management of active medical problems listed below. Physiatrist and rehab team continue to assess barriers to discharge/monitor patient progress toward functional and medical goals.  Function:  Bathing Bathing position   Position: Wheelchair/chair at sink  Bathing parts Body parts bathed by patient: Right arm, Left arm, Chest, Abdomen Body parts bathed by helper: Buttocks, Back, Front perineal area  Bathing assist Assist Level:  (moderate assist)      Upper Body Dressing/Undressing Upper body dressing   What is the patient wearing?: Pull over shirt/dress     Pull over shirt/dress - Perfomed by patient: Thread/unthread right sleeve, Thread/unthread left sleeve, Put head through opening, Pull shirt over trunk          Upper body assist Assist Level: Supervision or verbal cues      Lower Body Dressing/Undressing Lower body dressing   What is the patient wearing?: Pants, AFO, Non-skid slipper socks     Pants- Performed by patient: Thread/unthread right pants leg, Thread/unthread left pants leg Pants- Performed by helper: Pull pants up/down               AFO - Performed by helper: Don/doff left AFO (left post op shoe)      Lower body assist Assist for lower body dressing: Touching or steadying assistance (Pt > 75%)      Toileting Toileting   Toileting steps  completed by patient: Adjust clothing prior to toileting, Adjust clothing after toileting Toileting steps completed by helper: Performs perineal hygiene Toileting Assistive Devices: Grab bar or rail  Toileting assist Assist level: Touching or steadying assistance (Pt.75%)   Transfers Chair/bed transfer   Chair/bed transfer method: Ambulatory Chair/bed transfer assist level: Touching or steadying assistance (Pt > 75%) Chair/bed transfer assistive device: Armrests, Other (knee scooter)     Locomotion Ambulation      Max distance: 150 Assist level: Supervision or verbal cues   Wheelchair   Type: Manual Max wheelchair distance: 150 Assist Level: Supervision or verbal cues  Cognition Comprehension Comprehension assist level: Understands basic 75 - 89% of the time/ requires cueing 10 - 24% of the time  Expression Expression assist level: Expresses basic 75 - 89% of the time/requires cueing 10 - 24% of the time. Needs helper to occlude trach/needs to repeat words.  Social Interaction Social Interaction assist level: Interacts appropriately 90% of the time - Needs monitoring or encouragement for participation or interaction.  Problem Solving Problem solving assist level: Solves basic 75 - 89% of the time/requires cueing 10 - 24% of the time  Memory Memory assist level: Recognizes or recalls 75 - 89% of the time/requires cueing 10 - 24% of the time    Medical Problem List and Plan: 1.  Limitations in self care, mobility deficits secondary to B/l embolic MCA infarcts.  CIR PT, OT SLP, team conf in am 2.  DVT Prophylaxis/Anticoagulation: Pharmaceutical: Lovenox, resumed , on ASA for CVA prophyllaxis  , normal LE dopplers                3. Pain Management: tylenol prn.  4. Mood: LCSW to follow for evaluation and support.  Mild adjustment issues but denies anxiety/depression. Monitor for now.   5. Neuropsych: This patient is capable of making decisions on his own behalf. 6. Skin/Wound Care: routine pressure relief measures. Cleanse incision with normal saline and apply Gentamicin cream every other day. 7. Fluids/Electrolytes/Nutrition: Monitor I/O. Educate patient on heart healthy diet. 8. Right forefoot reconstruction: NWB with boot for support.   DPM note reviewed from 08/19/16, cont NWB and dressing change qod, recheck 4 wks, pt states that his f/u appt was 1/31, Pins CDI, no erythema or drainage in toes, mild swelling of foot and ankle some indentation of ACE wrap at ankle 9. H/o Gout: Stable. Monitor for  now . 10. Insomnia: managed by trazodone.  11.  New diagnosis Diabetes: Hgb A1c- 7.0. Consulted RD for education. Monitor BS ac/hs  Started low dose metformin.   Use SSI for elevated BS.   Monitor with increased activity 12. Labile blood pressure, Now improving   Vitals:   09/07/16 1407 09/08/16 0444  BP: 124/78 138/84  Pulse: 99 99  Resp: 18 18  Temp: 97.7 F (36.5 C) 98.6 F (37 C)   13. Hypoalbuminemia  Supplement initiated 1/25 14. ABLA- mild  Hb 12.0 on 1/25  Cont to monitor 15.  Hamstring strain resolved LOS (Days) 6 A FACE TO FACE EVALUATION WAS PERFORMED  Claudette Laws E 09/08/2016 7:20 AM

## 2016-09-08 NOTE — Plan of Care (Signed)
Problem: RH SKIN INTEGRITY Goal: RH STG MAINTAIN SKIN INTEGRITY WITH ASSISTANCE STG Maintain Skin Integrity With Assistance. Max A Outcome: Not Progressing Total A at this time

## 2016-09-08 NOTE — Progress Notes (Signed)
Physical Therapy Session Note  Patient Details  Name: Aaron Moon MRN: [id number] Date of Birth: June 16, 1946  Today's Date: 09/08/2016 PT Individual Time: 1530-1630 PT Individual Time Calculation (min): 60 min   Short Term Goals: Week 1:  PT Short Term Goal 1 (Week 1): pt will transfer with LRAD and min assist PT Short Term Goal 2 (Week 1): Pt will ambulate with LRAD x150' and min guard PT Short Term Goal 3 (Week 1): Pt will negotiate 4 steps with mod assist  Skilled Therapeutic Interventions/Progress Updates:    no c/o pain.  Session focus on mobility with w/c and knee scooter, transfers, balance, and cognitive deficits.    Handoff from NT with pt on toilet with +BM.  Sit<>stand x2 from toilet with grab bars and close supervision, verbal cues for forward weight shift and maintaining weight only through heel on RLE.  Pt able to complete peri-care this session with close supervision in sitting and in standing, but requires cues to steady self with RUE on R grab bar, instead of pt's self selected position of RUE reaching across midline for grab bar on L.  Pt also able to manage clothing today with close supervision>steady assist.  Pt returned to w/c with steady assist for pivot.    W/C propulsion throughout unit, max distance 150', with supervision.  PT instructed pt in w/c mobility weaving through cones focus on navigating in tight spaces with supervision and initial mod cues fade to supervision only.  Gait training weaving through cones with knee scooter, steady assist and verbal cues for safe turning with scooter.  Pt continues to requires steady assist and step by step instructions for sit<>stand transfers with knee scooter.  PT instructed pt in car transfer with min assist and verbal cues for w/c set up and safety with transfer sitting down before lifting LEs into car.    Pt returned to room at end of session and positioned with call bell in reach and needs met.    Therapy  Documentation Precautions:  Precautions Precautions: Fall Required Braces or Orthoses: Other Brace/Splint Other Brace/Splint: CAM boot RLE, post op shoe on left Restrictions Weight Bearing Restrictions: Yes RLE Weight Bearing: Non weight bearing Other Position/Activity Restrictions: Pt reports being able to use it for balance in standing but not to walk on it, however per PA NWB   See Function Navigator for Current Functional Status.   Therapy/Group: Individual Therapy  Earnest Conroy Penven-Crew 09/08/2016, 4:39 PM

## 2016-09-08 NOTE — Progress Notes (Signed)
Orthopedic Tech Progress Note Patient Details:  Aaron SouDana Moon Nov 16, 1945 [id number]  Patient ID: Aaron Moon, male   DOB: Nov 16, 1945, 71 y.o.   MRN: [id number]   Aaron DomCrawford, Aaron Moon 09/08/2016, 2:29 PM Called in advanced brace order; [facility]

## 2016-09-08 NOTE — Progress Notes (Signed)
Speech Language Pathology Daily Session Note  Patient Details  Name: Aaron Moon Bar MRN: [id number] Date of Birth: May 19, 1946  Today's Date: 09/08/2016 SLP Individual Time: 1105-1200 SLP Individual Time Calculation (min): 55 min  Short Term Goals: Week 1: SLP Short Term Goal 1 (Week 1): Patient will demonstrate functional problem solving with basic and familiar tasks with Min A verbal cues.  SLP Short Term Goal 2 (Week 1): Patient will utilize external memory aids to recall new, daily information with Min A verbal and visual cues.  SLP Short Term Goal 3 (Week 1): Patient will self-monitor and correct errors during functional tasks with Min A question cues.  SLP Short Term Goal 4 (Week 1): Patient will follow complex tasks/directions with supervision verbal cues.   Skilled Therapeutic Interventions: Skilled treatment session focused on cognitive goals. SLP facilitated session by providing Max A verbal cues for recall of his current medications and their functions and Min A verbal cues for problem solving in regards to utilizing a pill box at discharge to maximize recall and overall safety with medication administration. Patient also demonstrated appropriate anticipatory awareness in regards to reporting concern that he currently does not have anyone who could assist him at home. CSW made aware. Patient left upright in wheelchair with all needs within reach. Continue with current plan of care.      Function:  Cognition Comprehension Comprehension assist level: Understands basic 75 - 89% of the time/ requires cueing 10 - 24% of the time  Expression   Expression assist level: Expresses basic 75 - 89% of the time/requires cueing 10 - 24% of the time. Needs helper to occlude trach/needs to repeat words.  Social Interaction Social Interaction assist level: Interacts appropriately 90% of the time - Needs monitoring or encouragement for participation or interaction.  Problem Solving Problem solving  assist level: Solves basic 75 - 89% of the time/requires cueing 10 - 24% of the time  Memory Memory assist level: Recognizes or recalls 75 - 89% of the time/requires cueing 10 - 24% of the time    Pain No/Denies Pain   Therapy/Group: Individual Therapy  Honor Fairbank 09/08/2016, 12:25 PM

## 2016-09-08 NOTE — Progress Notes (Signed)
Occupational Therapy Session Note  Patient Details  Name: Aaron Moon MRN: [id number] Date of Birth: 09-07-1945  Today's Date: 09/08/2016 OT Individual Time: 0930-1030 and 1300-1330 OT Individual Time Calculation (min): 60 min and 30 min   Short Term Goals: Week 1:  OT Short Term Goal 1 (Week 1): Pt will complete toilet transfers with min assist and LRAD OT Short Term Goal 2 (Week 1): Pt will complete sit > stand with mod assist while maintaining NWB status to complete LB dressing/hygiene OT Short Term Goal 3 (Week 1): Pt will don underwear and pants with mod assist OT Short Term Goal 4 (Week 1): Pt will utilize RUE during bathing and dressing with min cues to incorporate  Skilled Therapeutic Interventions/Progress Updates:    1) Treatment session with focus on functional transfers, sit > stand, and increased sequencing with LB dressing.  Pt received in bed.  Completed transfer to w/c at sink with use of knee walker.  Pt required max cues for sequencing safety of sit > stand and transfer on to knee walker and min assist. Weight bearing through Rt heel only with sit > stand but maintaining NWB in standing and use of knee walker for mobility. Cues for thoroughness with bathing at sink, including therapist assisting with washing buttocks while pt maintained standing.  Pt required assistance to thread RLE due to getting caught on ace wrap with no awareness or problem solving.  Completed toilet transfer in bathroom with use of knee walker with min assist for sequencing as well as assistance for hygiene.  Left upright in w/c with all needs in reach.  2) Treatment session with focus on RUE NMR.  Pt propelled w/c to therapy gym with increased time, demonstrating increased motor control of RUE with propulsion.  Engaged in reaching and fine motor coordination with use of resistive peg board.  Pt continuing to demonstrate mild-moderate ataxia with peg activity, most notably when reaching across midline and  out to Rt.  Stacking cups activity with focus on motor control with placing and removing cups, more ataxia noted when unstacking cups. Pt reports still not using his Rt hand with self-feeding as he does not trust it.  Therapy Documentation Precautions:  Precautions Precautions: Fall Required Braces or Orthoses: Other Brace/Splint Other Brace/Splint: CAM boot RLE, post op shoe on left Restrictions Weight Bearing Restrictions: Yes RLE Weight Bearing: Non weight bearing Other Position/Activity Restrictions: Pt reports being able to use it for balance in standing but not to walk on it, however per PA NWB Pain:  Pt with no c/o pain  See Function Navigator for Current Functional Status.   Therapy/Group: Individual Therapy  Rosalio LoudHOXIE, Aaron Moon 09/08/2016, 12:09 PM

## 2016-09-09 ENCOUNTER — Ambulatory Visit: Payer: Federal, State, Local not specified - PPO | Admitting: Podiatry

## 2016-09-09 ENCOUNTER — Inpatient Hospital Stay (HOSPITAL_COMMUNITY): Payer: Federal, State, Local not specified - PPO | Admitting: Speech Pathology

## 2016-09-09 ENCOUNTER — Inpatient Hospital Stay (HOSPITAL_COMMUNITY): Payer: Federal, State, Local not specified - PPO | Admitting: Physical Therapy

## 2016-09-09 ENCOUNTER — Inpatient Hospital Stay (HOSPITAL_COMMUNITY): Payer: Federal, State, Local not specified - PPO | Admitting: Occupational Therapy

## 2016-09-09 ENCOUNTER — Ambulatory Visit (HOSPITAL_COMMUNITY): Payer: Federal, State, Local not specified - PPO | Admitting: Psychology

## 2016-09-09 DIAGNOSIS — R4701 Aphasia: Secondary | ICD-10-CM

## 2016-09-09 DIAGNOSIS — R413 Other amnesia: Secondary | ICD-10-CM

## 2016-09-09 DIAGNOSIS — I639 Cerebral infarction, unspecified: Secondary | ICD-10-CM

## 2016-09-09 DIAGNOSIS — I69393 Ataxia following cerebral infarction: Secondary | ICD-10-CM

## 2016-09-09 LAB — CBC
HEMATOCRIT: 40.3 % (ref 39.0–52.0)
HEMOGLOBIN: 13 g/dL (ref 13.0–17.0)
MCH: 28.6 pg (ref 26.0–34.0)
MCHC: 32.3 g/dL (ref 30.0–36.0)
MCV: 88.6 fL (ref 78.0–100.0)
Platelets: 261 10*3/uL (ref 150–400)
RBC: 4.55 MIL/uL (ref 4.22–5.81)
RDW: 13.8 % (ref 11.5–15.5)
WBC: 9.7 10*3/uL (ref 4.0–10.5)

## 2016-09-09 LAB — BASIC METABOLIC PANEL
ANION GAP: 8 (ref 5–15)
BUN: 20 mg/dL (ref 6–20)
CHLORIDE: 104 mmol/L (ref 101–111)
CO2: 24 mmol/L (ref 22–32)
Calcium: 9 mg/dL (ref 8.9–10.3)
Creatinine, Ser: 1.05 mg/dL (ref 0.61–1.24)
Glucose, Bld: 125 mg/dL — ABNORMAL HIGH (ref 65–99)
POTASSIUM: 4.6 mmol/L (ref 3.5–5.1)
SODIUM: 136 mmol/L (ref 135–145)

## 2016-09-09 LAB — GLUCOSE, CAPILLARY
GLUCOSE-CAPILLARY: 159 mg/dL — AB (ref 65–99)
GLUCOSE-CAPILLARY: 166 mg/dL — AB (ref 65–99)
GLUCOSE-CAPILLARY: 151 mg/dL — AB (ref 65–99)
Glucose-Capillary: 142 mg/dL — ABNORMAL HIGH (ref 65–99)

## 2016-09-09 NOTE — Progress Notes (Signed)
Physical Therapy Session Note  Patient Details  Name: Aaron Moon MRN: [id number] Date of Birth: 1945/11/26  Today's Date: 09/09/2016 PT Individual Time: 1500-1600 PT Individual Time Calculation (min): 60 min   Short Term Goals: Week 1:  PT Short Term Goal 1 (Week 1): pt will transfer with LRAD and min assist PT Short Term Goal 2 (Week 1): Pt will ambulate with LRAD x150' and min guard PT Short Term Goal 3 (Week 1): Pt will negotiate 4 steps with mod assist  Skilled Therapeutic Interventions/Progress Updates:    no c/o pain.  Session focus on UE/LE strengthening, activity tolerance, functional transfers, and recall.    Pt propelled w/c throughout unit, 150' +300' +150', with supervision for mobility, UE strengthening, and cardiopulmonary endurance.  PT provided pt with w/c gloves for improved tolerance with pushing drive wheels.  Propulsion on tile and carpeted surface and around obstacles/through doorways.    LE therex 2x15 reps with 3# ankle weights LAQ, hip flexion, and ankle DF with min cues for technique and repetitions.  Pt completed second set with supervision.    PT instructed pt in simulated home ambulation and furniture transfer with knee scooter in therapy apartment.  Close supervision/steady assist for ambulatory transfer from w/c to therapy apartment and ending on couch.  Pt requiring decreased cues for sit<>stand this session.    Pt returned to room at end of session with call bell in reach and needs met.   Therapy Documentation Precautions:  Precautions Precautions: Fall Required Braces or Orthoses: Other Brace/Splint Other Brace/Splint: CAM boot RLE, post op shoe on left Restrictions Weight Bearing Restrictions: Yes RLE Weight Bearing: Non weight bearing Other Position/Activity Restrictions: per DPM, pt able to bear weight through R heel for transfers only   See Function Navigator for Current Functional Status.   Therapy/Group: Individual Therapy  Earnest Conroy  Penven-Crew 09/09/2016, 4:01 PM

## 2016-09-09 NOTE — Progress Notes (Signed)
Occupational Therapy Session Note  Patient Details  Name: Aaron Moon MRN: [id number] Date of Birth: 1946-02-22  Today's Date: 09/09/2016 OT Individual Time: [id number] and [id number] OT Individual Time Calculation (min): 60 min and 44 min   Short Term Goals: Week 1:  OT Short Term Goal 1 (Week 1): Pt will complete toilet transfers with min assist and LRAD OT Short Term Goal 2 (Week 1): Pt will complete sit > stand with mod assist while maintaining NWB status to complete LB dressing/hygiene OT Short Term Goal 3 (Week 1): Pt will don underwear and pants with mod assist OT Short Term Goal 4 (Week 1): Pt will utilize RUE during bathing and dressing with min cues to incorporate  Skilled Therapeutic Interventions/Progress Updates:    1) Treatment session with focus on sit > stand, transfers, LB dressing, and functional use of RUE.  Pt received seated at sink beginning to complete bathing without assistance.  Provided cues for thoroughness.  Pt reports need to toilet. Completed stand pivot transfer w/c > toilet with use of grab bars and min assist.  Pt demonstrating improved sequencing with more appropriate hand placement on grab bars to increase safety with transfers.  Pt completed hygiene and transferred back to w/c with supervision.  Pt donned pants this session with steady assist while standing, continues to require assistance with donning post op shoe and CAM boot due to floor to seat heigh of w/c.  Engaged in blocked practice of transfers with use of knee walker, with pt asking appropriate questions about setup of walker.  Clarified positioning with improved sequencing.  Pt required min cues for hand placement to release brakes prior to sitting.  Engaged in table top task with focus on increased attention to RUE and functional use during fine motor task.  Pt required min cues to incorporate use of Rt hand.  2) Treatment session with focus on functional use of RUE and d/c planning.  Upon arrival pt  reporting to therapist planned d/c date.  Pt reports concern regarding decreased functional use of RUE.  Discussed goals of IP Rehab as well as plans for continued HHOT upon d/c home to continue to address safety and RUE.  Pt propelled w/c to therapy gym without assistance.  Engaged in functional activity with focus on RUE gross and fine motor control as well as grasp strength, utilizing resistive clothespins and various grips followed by 3D pipe tree puzzle. Pt continues to demonstrate ataxia with gross motor activities as well as increased time to motor plan smaller movements.  Pt demonstrating difficulty with pipe tree puzzle, often choosing incorrect pieces requiring mod cues for sequencing and awareness of errors.  Discussed carryover of activity into function, with pt reporting that his challenge was in organization both with activity and with functional mobility - showing some intellectual and emergent awareness.  Therapy Documentation Precautions:  Precautions Precautions: Fall Required Braces or Orthoses: Other Brace/Splint Other Brace/Splint: CAM boot RLE, post op shoe on left Restrictions Weight Bearing Restrictions: Yes (nwb to r sole, wb to r heel) RLE Weight Bearing: Non weight bearing Other Position/Activity Restrictions: per DPM, pt able to bear weight through R heel for transfers only General:   Vital Signs:   Pain:   ADL:   Exercises:   Other Treatments:    See Function Navigator for Current Functional Status.   Therapy/Group: Individual Therapy  Rosalio LoudHOXIE, Evella Kasal 09/09/2016, 9:25 AM

## 2016-09-09 NOTE — Progress Notes (Signed)
Social Work Patient ID: Aaron Moon, male   DOB: [birth year], 71 y.o.   MRN: [id number]  Met with pt to discuss team conference goals supervision level and discharge 2/8. He reports his friend will help him some But not his nephew, he just laughed when asked if he comes by and check on him. Will contact [employer] social worker to see if any programs can assist pt with to assist him at home. Will resume his home health services And work on his discharge plans.

## 2016-09-09 NOTE — Progress Notes (Signed)
PHYSICAL MEDICINE & REHABILITATION     PROGRESS NOTE  Subjective/Complaints:   Some nocturnal foot pain relieved with Tylenol, had questions about etiology of stroke.  ROS: Denies CP, SOB, N/V/D.  Objective: Vital Signs: Blood pressure 116/74, pulse 75, temperature 98.8 F (37.1 C), temperature source Oral, resp. rate 18, height 5\' 7"  (1.702 m), weight 115.8 kg (255 lb 4.7 oz), SpO2 96 %. No results found.  Recent Labs  09/09/16 0511  WBC 9.7  HGB 13.0  HCT 40.3  PLT 261    Recent Labs  09/09/16 0511  NA 136  K 4.6  CL 104  GLUCOSE 125*  BUN 20  CREATININE 1.05  CALCIUM 9.0   CBG (last 3)   Recent Labs  09/08/16 1653 09/08/16 2110 09/09/16 0625  GLUCAP 192* 162* 142*    Wt Readings from Last 3 Encounters:  09/02/16 115.8 kg (255 lb 4.7 oz)  08/31/16 117.6 kg (259 lb 4.2 oz)  08/08/15 129.3 kg (285 lb)    Physical Exam:  BP 116/74 (BP Location: Left Arm)   Pulse 75   Temp 98.8 F (37.1 C) (Oral)   Resp 18   Ht 5\' 7"  (1.702 m)   Wt 115.8 kg (255 lb 4.7 oz)   SpO2 96%   BMI 39.98 kg/m  Constitutional: He appears well-developed. Obese  HENT: Normocephalic and atraumatic.  Eyes: EOM are normal. No discharge.  Cardiovascular: Normal rate and regular rhythm.  No JVD. Respiratory: Effort normal and breath sounds normal.  GI: Soft. Bowel sounds are normal. Obese  Musculoskeletal: He exhibits no edema or tenderness.  Neurological: He is alert and oriented.  Mild right facial weakness.  Speech clear.  Able to follow commands.  Motor: RUE: 4/5 proximal to distal, severe dysmetria and ataxia RLE: 5/5 proximally, boot distally LUE: 4+/5 proximal to distal LLE: 5/5 proximal to distal  Skin: Loop recorder site no evidence of hematoma, protruding K wires from distal toes on RIght no drainage Psychiatric: normal affect today   Assessment/Plan: 1. Functional deficits secondary to B/l embolic MCA infarcts which require 3+ hours per day of  interdisciplinary therapy in a comprehensive inpatient rehab setting. Physiatrist is providing close team supervision and 24 hour management of active medical problems listed below. Physiatrist and rehab team continue to assess barriers to discharge/monitor patient progress toward functional and medical goals.  Function:  Bathing Bathing position   Position: Wheelchair/chair at sink  Bathing parts Body parts bathed by patient: Right arm, Left arm, Chest, Abdomen, Front perineal area, Right upper leg, Left upper leg Body parts bathed by helper: Buttocks, Back  Bathing assist Assist Level:  (mod assist)      Upper Body Dressing/Undressing Upper body dressing   What is the patient wearing?: Pull over shirt/dress     Pull over shirt/dress - Perfomed by patient: Thread/unthread right sleeve, Thread/unthread left sleeve, Put head through opening, Pull shirt over trunk          Upper body assist Assist Level: Set up   Set up : To obtain clothing/put away  Lower Body Dressing/Undressing Lower body dressing   What is the patient wearing?: Pants, AFO, Non-skid slipper socks     Pants- Performed by patient: Thread/unthread left pants leg Pants- Performed by helper: Thread/unthread right pants leg, Pull pants up/down               AFO - Performed by helper: Don/doff right AFO, Don/doff left AFO (Rt CAM  boot and Lt  post op shoe)      Lower body assist Assist for lower body dressing:  (max assist)      Toileting Toileting   Toileting steps completed by patient: Adjust clothing prior to toileting Toileting steps completed by helper: Performs perineal hygiene, Adjust clothing after toileting Toileting Assistive Devices: Grab bar or rail  Toileting assist Assist level:  (max assist)   Transfers Chair/bed transfer   Chair/bed transfer method: Stand pivot, Ambulatory Chair/bed transfer assist level: Touching or steadying assistance (Pt > 75%) Chair/bed transfer assistive  device: Armrests, Other (knee scooter)     Locomotion Ambulation     Max distance: 150 Assist level: Supervision or verbal cues   Wheelchair   Type: Manual Max wheelchair distance: 150 Assist Level: Supervision or verbal cues  Cognition Comprehension Comprehension assist level: Understands basic 75 - 89% of the time/ requires cueing 10 - 24% of the time  Expression Expression assist level: Expresses basic 75 - 89% of the time/requires cueing 10 - 24% of the time. Needs helper to occlude trach/needs to repeat words.  Social Interaction Social Interaction assist level: Interacts appropriately 90% of the time - Needs monitoring or encouragement for participation or interaction.  Problem Solving Problem solving assist level: Solves basic 75 - 89% of the time/requires cueing 10 - 24% of the time  Memory Memory assist level: Recognizes or recalls 25 - 49% of the time/requires cueing 50 - 75% of the time    Medical Problem List and Plan: 1.  Limitations in self care, mobility deficits secondary to B/l embolic MCA infarcts.  CIR PT, OT SLP, team conf in am 2.  DVT Prophylaxis/Anticoagulation: Pharmaceutical: Lovenox, resumed , on ASA for CVA prophyllaxis  , normal LE dopplers                3. Pain Management: tylenol prn.  4. Mood: LCSW to follow for evaluation and support.  Mild adjustment issues but denies anxiety/depression. Monitor for now.   5. Neuropsych: This patient is capable of making decisions on his own behalf. 6. Skin/Wound Care: routine pressure relief measures. Cleanse incision with normal saline and apply Gentamicin cream every other day. 7. Fluids/Electrolytes/Nutrition: Monitor I/O. Educate patient on heart healthy diet. 8. Right forefoot reconstruction: NWB with boot for support.   DPM note reviewed from 08/19/16, cont NWB and dressing change qod, recheck 4 wks, pt states that his f/u appt was 1/31, Pins CDI, no erythema or drainage in toes, mild swelling of foot and ankle  some indentation of ACE wrap at ankle 9. H/o Gout: Stable. Monitor for now . 10. Insomnia: managed by trazodone.  11.  New diagnosis Diabetes: Hgb A1c- 7.0. Consulted RD for education. Monitor BS ac/hs  Started low dose metformin.   Use SSI for elevated BS.   CBGs in desired range 12. Labile blood pressure, improved no meds   Vitals:   09/08/16 1348 09/09/16 0449  BP: 124/81 116/74  Pulse: 94 75  Resp: 18 18  Temp: 98 F (36.7 C) 98.8 F (37.1 C)   13. Hypoalbuminemia  Supplement initiated 1/25 14. ABLA-resolved   LOS (Days) 7 A FACE TO FACE EVALUATION WAS PERFORMED  Erick ColaceKIRSTEINS,Yanessa Hocevar E 09/09/2016 7:36 AM

## 2016-09-09 NOTE — Progress Notes (Signed)
Speech Language Pathology Daily Session Note  Patient Details  Name: Aaron SouDana Moon MRN: [id number] Date of Birth: [birth year]  Today's Date: 09/09/2016 SLP Individual Time: 0730-0800 SLP Individual Time Calculation (min): 30 min  Short Term Goals: Week 1: SLP Short Term Goal 1 (Week 1): Patient will demonstrate functional problem solving with basic and familiar tasks with Min A verbal cues.  SLP Short Term Goal 2 (Week 1): Patient will utilize external memory aids to recall new, daily information with Min A verbal and visual cues.  SLP Short Term Goal 3 (Week 1): Patient will self-monitor and correct errors during functional tasks with Min A question cues.  SLP Short Term Goal 4 (Week 1): Patient will follow complex tasks/directions with supervision verbal cues.   Skilled Therapeutic Interventions: Skilled treatment session focused on cognitive goals. SLP facilitated session by providing Min-Mod A verbal cues for sequencing and problem solving sit to stands with the knee scooter to maximize recall during PT/OT sessions. Patient also transferred to the wheelchair with Mod A verbal cues for sequencing. Patient left upright in wheelchair with all needs within reach. Continue with current plan of care.      Function:   Cognition Comprehension Comprehension assist level: Understands basic 75 - 89% of the time/ requires cueing 10 - 24% of the time  Expression   Expression assist level: Expresses basic 75 - 89% of the time/requires cueing 10 - 24% of the time. Needs helper to occlude trach/needs to repeat words.  Social Interaction Social Interaction assist level: Interacts appropriately 90% of the time - Needs monitoring or encouragement for participation or interaction.  Problem Solving Problem solving assist level: Solves basic 75 - 89% of the time/requires cueing 10 - 24% of the time  Memory Memory assist level: Recognizes or recalls 25 - 49% of the time/requires cueing 50 - 75% of the time     Pain No/Denies Pain   Therapy/Group: Individual Therapy  Aaron Moon 09/09/2016, 3:01 PM

## 2016-09-09 NOTE — Progress Notes (Signed)
Neuropsychological consultation  Patient:   Aaron Moon   DOB:   Sep 24, 1945  MR Number:  [id number]  Location:  [facility] [id number] [place] [address] Dept: [phone] Loc: [phone]           Date of Service:   09/09/2016  Start Time:   1 PM End Time:   2 PM  Provider/Observer:  Hershal Coria PSYD       Billing Code/Service: [phone]  Chief Complaint:     Chief Complaint  Patient presents with  . Memory Loss    Reason for Service:  The patient was referred for neuropsychological consultation primarily to do with a psychological assessment regarding his coping and dealing with all of the current changes it happened with him. The patient had a cerebrovascular accident/stroke. The patient was in the process of having a series of multiple foot surgeries and had just had his second surgery on his foot when the CVA happen. The patient had done very well after his first surgery but did need some help after his second operation. She reports that he initially developed pain in the side of his leg and didn't think much about it. Later on, the patient reports he was watching a football game and then his arm went limp and feels it was pretty clear that he was having a stroke. His cousin was there with him. The patient was admitted to [facility] long. The patient is now on his fifth week from recovery from his foot surgery and is beginning to be able to bear weight on his foot.  As far as the resulting effects of a CVA, the patient does report that he is continuing to have short-term memory deficits, expressive language deficits and primarily right side mostly right arm weakness. He reports that he is gaining more and more control over his right arm and hand but he still has significant limited functioning. The patient reports that he is worried about living by himself once he is discharged. He  reports that cognitively he is doing much better with the exception of his word finding issues and his motor function issues. He reports that he is in good spirits although he is worried about his cats at home and was happening while he is away. He reports that he is disappointed by the fact that his cousin is not around helping him like he thought he would be.  Current Status:  The patient reports that he is coping better today than he had been in prior days. He reports that this is been quite overwhelming to him and he has been worried about what can happen once he is unable to do his job. The patient lives alone and does appear to be a very gregarious individual who might not do very well at home by himself most the time.  Reliability of Information: Information is provided by the patient as well as review of available medical records and consultation with staff.  Behavioral Observation: Aaron Moon  presents as a 71 y.o.-year-old right handed  Male who appeared his stated age. his dress was Appropriate and he was Fairly Groomed and his manners were Appropriate to the situation.  his participation was indicative of Appropriate behaviors.  There were  physical disabilities noted, primarily have him do with right arm weakness. I was unable to assess his leg strength as he is just now getting to her recovery point from  surgery where he is able to weight-bear on his heel.  he displayed an appropriate level of cooperation and motivation.     Interactions:    Active Sharing  Attention:   within normal limits and attention span and concentration were age appropriate  Memory:   within normal limits; recent and remote memory intact  Visuo-spatial:  within normal limits  Speech (Volume):  normal  Speech:   fluent aphasia; The patient did spray issues regarding expressive language and fluency. He clearly had some word finding deficits and complained about them on numerous occasions. Most of the time he  was able to be quite fluent but there were particular words particularly nouns of a descriptive nature but the patient had the most difficulty with.  Thought Process:  Coherent  Though Content:  WNL; no indications of any hallucinations, suicidal ideation, or homicidal ideation.  Orientation:   person, place, time/date and situation  Judgment:   Fair  Planning:   Fair  Affect:    Depressed  Mood:    Depressed  Insight:   Good  Intelligence:   normal  Marital Status/Living: The patient is currently living by himself and has no one else to take care of him or for him to take care of besides his 3 cats.  Current Employment: The patient had been working some even though he is able to fully retire he wanted to. The patient had been doing very well at work and it was likely very positive for him as he gave Ms. primary interactions with others.   Medical History:   Past Medical History:  Diagnosis Date  . Gout   . Hyperlipidemia   . Obesity         Facility-Administered Encounter Medications as of 09/09/2016  Medication  . acetaminophen (TYLENOL) tablet 325-650 mg  . albuterol (PROVENTIL) (2.5 MG/3ML) 0.083% nebulizer solution 3 mL  . alum & mag hydroxide-simeth (MAALOX/MYLANTA) 200-200-20 MG/5ML suspension 30 mL  . aspirin EC tablet 325 mg  . atorvastatin (LIPITOR) tablet 20 mg  . bisacodyl (DULCOLAX) suppository 10 mg  . cyclobenzaprine (FLEXERIL) tablet 5 mg  . diphenhydrAMINE (BENADRYL) 12.5 MG/5ML elixir 12.5-25 mg  . enoxaparin (LOVENOX) injection 40 mg  . feeding supplement (ENSURE ENLIVE) (ENSURE ENLIVE) liquid 237 mL  . feeding supplement (PRO-STAT SUGAR FREE 64) liquid 30 mL  . gabapentin (NEURONTIN) capsule 100 mg  . gentamicin cream (GARAMYCIN) 0.1 %  . guaiFENesin-dextromethorphan (ROBITUSSIN DM) 100-10 MG/5ML syrup 5-10 mL  . hydrocerin (EUCERIN) cream  . insulin aspart (novoLOG) injection 0-5 Units  . insulin aspart (novoLOG) injection 0-9 Units  . metFORMIN  (GLUCOPHAGE) tablet 500 mg  . MUSCLE RUB CREA  . pantoprazole (PROTONIX) EC tablet 40 mg  . polyethylene glycol (MIRALAX / GLYCOLAX) packet 17 g  . prochlorperazine (COMPAZINE) tablet 5-10 mg   Or  . prochlorperazine (COMPAZINE) injection 5-10 mg   Or  . prochlorperazine (COMPAZINE) suppository 12.5 mg  . sodium phosphate (FLEET) 7-19 GM/118ML enema 1 enema  . traZODone (DESYREL) tablet 100 mg   Outpatient Encounter Prescriptions as of 09/09/2016  Medication Sig  . albuterol (PROVENTIL HFA;VENTOLIN HFA) 108 (90 BASE) MCG/ACT inhaler Inhale 2 puffs into the lungs every 6 (six) hours as needed for wheezing.  Marland Kitchen. ALPRAZolam (XANAX) 0.5 MG tablet Take 1 tablet (0.5 mg total) by mouth at bedtime as needed.  Marland Kitchen. gentamicin cream (GARAMYCIN) 0.1 % Apply 1 application topically 3 (three) times daily. (Patient taking differently: Apply 1 application topically daily as  needed (wound care/dressing change). )  . meloxicam (MOBIC) 15 MG tablet Take 15 mg by mouth daily.  Marland Kitchen omeprazole (PRILOSEC) 20 MG capsule Take 20 mg by mouth daily as needed (acid reflux).   Marland Kitchen oxyCODONE-acetaminophen (ROXICET) 5-325 MG tablet Take 1 tablet by mouth every 8 (eight) hours as needed for severe pain.  Marland Kitchen oxyCODONE-acetaminophen (ROXICET) 5-325 MG tablet Take 1 tablet by mouth every 8 (eight) hours as needed for severe pain.  . traZODone (DESYREL) 100 MG tablet Take 100 mg by mouth at bedtime.          Sexual History:   History  Sexual Activity  . Sexual activity: No      Family Med/Psych History:  Family History  Problem Relation Age of Onset  . ALS Mother   . Pancreatic cancer Father     Risk of Suicide/Violence: virtually non-existent   Impression/DX:  The patient acknowledges that he has had some difficulties adjusting to what he expects to be an inability to return to the work that he been doing. The patient tends to be isolated at his own apartment and while he has a cousin that lives nearby he describes  his cousin is being fairly unreliable. The patient has no other friends and family around and her eyes most of his social interaction from people at work. He is a very talkative individual. Given the circumstances think he is coping adequately and appropriate to the situation but I will remain available for further consultation and will plan on meeting with him the first of next week.  Disposition/Plan:  I will have a follow-up psychological appointment therapeutic appointment  Diagnosis:    Acute ischemic stroke (HCC)  Ataxia, post-stroke  Memory loss  Expressive aphasia         Electronically Signed   _______________________ Arley Phenix, Psy.D.

## 2016-09-10 ENCOUNTER — Inpatient Hospital Stay (HOSPITAL_COMMUNITY): Payer: Federal, State, Local not specified - PPO | Admitting: Physical Therapy

## 2016-09-10 ENCOUNTER — Inpatient Hospital Stay (HOSPITAL_COMMUNITY): Payer: Federal, State, Local not specified - PPO | Admitting: Speech Pathology

## 2016-09-10 ENCOUNTER — Inpatient Hospital Stay (HOSPITAL_COMMUNITY): Payer: Federal, State, Local not specified - PPO | Admitting: Occupational Therapy

## 2016-09-10 DIAGNOSIS — G8191 Hemiplegia, unspecified affecting right dominant side: Secondary | ICD-10-CM

## 2016-09-10 LAB — GLUCOSE, CAPILLARY
GLUCOSE-CAPILLARY: 149 mg/dL — AB (ref 65–99)
GLUCOSE-CAPILLARY: 187 mg/dL — AB (ref 65–99)
Glucose-Capillary: 128 mg/dL — ABNORMAL HIGH (ref 65–99)
Glucose-Capillary: 172 mg/dL — ABNORMAL HIGH (ref 65–99)

## 2016-09-10 NOTE — Progress Notes (Signed)
Social Work Patient ID: Doug SouDana Dorman, male   DOB: January 30, 1946, 71 y.o.   MRN: [id number]  Have contacted [facility] the home health agency pt had before coming into the hospital. Have informed them About his discharge date and his needs, HHPT,HHOT and HHRN. Have also left two messages for [employer] [facility]ennifer Mischler-SW at [facility] [place] to seek out resources. Will continue to call her until I get her on the Phone.

## 2016-09-10 NOTE — Patient Care Conference (Signed)
Inpatient RehabilitationTeam Conference and Plan of Care Update Date: 09/09/2016   Time: 11:00 AM    Patient Name: Aaron Moon      Medical Record Number: [id number]  Date of Birth: 1946-04-04 Sex: Male         Room/Bed: 4M02C/4M02C-01 Payor Info: Payor: BLUE CROSS BLUE SHIELD / Plan: BCBS/[employer]ER[employer] PPO / Product Type: *No Product type* /    Admitting Diagnosis: B CVA  Admit Date/Time:  09/02/2016  5:14 PM Admission Comments: No comment available   Primary Diagnosis:  <principal problem not specified> Principal Problem: <principal problem not specified>  Patient Active Problem List   Diagnosis Date Noted  . Status post placement of implantable loop recorder   . Acute blood loss anemia   . Hypoalbuminemia due to protein-calorie malnutrition (HCC)   . Labile blood pressure   . Acute ischemic left MCA stroke (HCC) 09/02/2016  . Ataxia, post-stroke   . Adjustment disorder with mixed anxiety and depressed mood   . Status post right foot surgery   . History of gout   . Primary insomnia   . Acute ischemic stroke (HCC)   . Idiopathic gout   . Morbid obesity (HCC)   . Diastolic dysfunction   . Benign essential HTN   . Diabetes mellitus (HCC)   . Leukocytosis   . CVA (cerebral vascular accident) (HCC) 08/30/2016  . Stroke (cerebrum) (HCC) 08/30/2016  . 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 Hammertoe of right foot 07/20/2016  . Hallux valgus, acquired, bilateral 02/19/2014  . Gout 02/13/2013  . Hyperlipidemia 01/18/2012  . Obesity 01/18/2012    Expected Discharge Date: Expected Discharge Date: 09/17/16  Team Members Present: Physician leading conference: Dr. Claudette LawsAndrew Kirsteins Social Worker Present: Dossie DerBecky Mane Consolo, LCSW Nurse Present: Chana Bodeeborah Sharp, RN PT Present: Teodoro Kilaitlin Penven-Crew, PT OT Present: Rosalio LoudSarah Hoxie, OT SLP Present: Fae PippinMelissa Bowie, SLP PPS Coordinator present : Tora DuckMarie Noel, RN, CRRN     Current Status/Progress Goal Weekly Team Focus  Medical   BM 2-3 times a day, allowed WB through heel, Foot  and hamstring pain improving  Improve awareness of deficits, improve safety  D/C planning   Bowel/Bladder   Continent of bowel and bladder this shift. Last BM 09/07/16.   Pt to remain continent of bowel and bladder  Monitor for signs of constipation.   Swallow/Nutrition/ Hydration             ADL's   min-mod assist transfers with knee walker, mod assist bathing, max assist LB dressing, decreased mental flexibility and problem solving  Supervision overall  ADL retraining, functional transfers with knee walker, standing balance, RUE NMR   Mobility   min guard>min assist for transfers and gait, supervision w/c mobility  supervision overall  transfers, gait with knee scooter, w/c propulsion for community, cognition   Communication             Safety/Cognition/ Behavioral Observations  Min-Mod A  Supervision  recall, awareness and problem solving    Pain   Tylenol 650 mg taken twice during night shift.   Reduce need for pain medication.  Monitoring for effectiveness of prn medication and alternative therapy (massage, position, etc.)   Skin   ORIF R ankle/IM nailing to R tib with visible pins to R toes  No additonal skin breakdown  Continue to assess area daily, and to apply antibiotic to pin site      *See Care Plan and progress notes for long and short-term goals.  Barriers to Discharge:  Will achieve supervision, no 24/7 sup    Possible Resolutions to Barriers:  Neuropsych f/u as outpt, check [facility] benefits    Discharge Planning/Teaching Needs:  Home with intermittent assist from nephew, wants to get services through the [place] for caregiver-doesn't happen that fast.      Team Discussion:  Goal of supervision level-which pa does not have at home. Difficulty with problem solving and sequencing in therapies. Now can bear weight through his heel and darco sandal ordered. Neuro-psych saw and will continue for coping. Impulsive needs to be  reminded to slow down. [facility]acting [facility] regarding any available services. Pt plans to manage at home like did prior to admission  Revisions to Treatment Plan:  DC 2/8      Lucy Chris 09/10/2016, 9:09 AM

## 2016-09-10 NOTE — Progress Notes (Signed)
Speech Language Pathology Weekly Progress and Session Note  Patient Details  Name: Aaron Moon MRN: [id number] Date of Birth: 02-09-1946  Beginning of progress report period: September 03, 2016 End of progress report period: September 10, 2016   Today's Date: 09/10/2016 SLP Individual Time: 1430-1500 SLP Individual Time Calculation (min): 30 min  Short Term Goals: Week 1: SLP Short Term Goal 1 (Week 1): Patient will demonstrate functional problem solving with basic and familiar tasks with Min A verbal cues.  SLP Short Term Goal 1 - Progress (Week 1): Met SLP Short Term Goal 2 (Week 1): Patient will utilize external memory aids to recall new, daily information with Min A verbal and visual cues.  SLP Short Term Goal 2 - Progress (Week 1): Not met SLP Short Term Goal 3 (Week 1): Patient will self-monitor and correct errors during functional tasks with Min A question cues.  SLP Short Term Goal 3 - Progress (Week 1): Met SLP Short Term Goal 4 (Week 1): Patient will follow complex tasks/directions with supervision verbal cues.  SLP Short Term Goal 4 - Progress (Week 1): Met    New Short Term Goals: Week 2: SLP Short Term Goal 1 (Week 2): Patient will utilize external memory aids to recall new, daily information with Min A verbal and visual cues.  SLP Short Term Goal 2 (Week 2): Patient will demonstrate functional problem solving with basic and familiar tasks with supervision verbal cues.  SLP Short Term Goal 3 (Week 2): Patient will self-monitor and correct errors during functional tasks with supervision verbal cues.  SLP Short Term Goal 4 (Week 2): Patient will follow complex tasks/directions with Mod I.   Weekly Progress Updates: Patient has made functional gains and has met 3 of 4 STG's this reporting period due to improved cognitive-linguistic function. Currently, patient follows complex commands/tasks with supervision verbal cues and requires Min A for functional problem solving and emergent  awareness of errors. Patient continues to require Mod-Max A multimodal cues for recall of functional information which is a barrier at this time. Patient would benefit from continued skilled SLP intervention to maximize his cognitive-linguistic function and overall functional independence prior to discharge.      Intensity: Minumum of 1-2 x/day, 30 to 90 minutes Frequency: 3 to 5 out of 7 days Duration/Length of Stay: 2/8 Treatment/Interventions: Cognitive remediation/compensation;Cueing hierarchy;Functional tasks;Patient/family education;Therapeutic Activities;Internal/external aids;Speech/Language facilitation;Environmental controls   Daily Session  Skilled Therapeutic Interventions: Skilled treatment session focused on cognitive-linguistic goals. SLP facilitated session by providing Min A question cues for recall of events from previous therapy sessions. Patient also participated in a verbal description task with focus on verbal reasoning and word-finding due to reports of questionable word-finding difficulty. Patient completed task with supervision verbal cues for reasoning and was Mod I for word-finding. Patient left upright in wheelchair with all needs within reach. Continue with current plan of care.      Function:   Cognition Comprehension Comprehension assist level: Understands basic 75 - 89% of the time/ requires cueing 10 - 24% of the time  Expression   Expression assist level: Expresses basic 90% of the time/requires cueing < 10% of the time.  Social Interaction Social Interaction assist level: Interacts appropriately 90% of the time - Needs monitoring or encouragement for participation or interaction.  Problem Solving Problem solving assist level: Solves basic 75 - 89% of the time/requires cueing 10 - 24% of the time  Memory Memory assist level: Recognizes or recalls 50 - 74% of the time/requires cueing  25 - 49% of the time   Pain No/Denies Pain   Therapy/Group: Individual  Therapy  Avaree Gilberti 09/10/2016, 3:10 PM

## 2016-09-10 NOTE — Progress Notes (Signed)
Physical Therapy Weekly Progress Note  Patient Details  Name: Aaron Moon MRN: [id number] Date of Birth: 11-07-1945  Beginning of progress report period: September 03, 2016 End of progress report period: September 10, 2016  Today's Date: 09/10/2016 PT Individual Time: 1000-1100 PT Individual Time Calculation (min): 60 min   Patient has met 2 of 3 short term goals.  Pt has made progress towards mobility this week and is currently performing at an overall close supervision>steady assist level for transfers and gait with knee walker.  Pt continues to demo cognitive deficits which impact his ability to solve problems and recall precautions.    Patient continues to demonstrate the following deficits muscle weakness, decreased cardiorespiratoy endurance and decreased attention, decreased awareness, decreased problem solving, decreased safety awareness, decreased memory and delayed processing and therefore will continue to benefit from skilled PT intervention to increase functional independence with mobility.  Patient progressing toward long term goals..  Continue plan of care.  PT Short Term Goals Week 1:  PT Short Term Goal 1 (Week 1): pt will transfer with LRAD and min assist PT Short Term Goal 1 - Progress (Week 1): Met PT Short Term Goal 2 (Week 1): Pt will ambulate with LRAD x150' and min guard PT Short Term Goal 2 - Progress (Week 1): Met PT Short Term Goal 3 (Week 1): Pt will negotiate 4 steps with mod assist PT Short Term Goal 3 - Progress (Week 1): Not met Week 2:  PT Short Term Goal 1 (Week 2): =LTGs due to ELOS  Skilled Therapeutic Interventions/Progress Updates:    no c/o pain.  Session focus on community level w/c propulsion, path finding, attention, awareness, and problem solving.  Pt propelled w/c to and from gift shop focus on using external aids to locate gift shop and recall how to return to unit.  Pt requiring overall min assist for pathfinding and increased cues needed to  recognize difference between the buttons to call the elevator and buttons to select the floor.  Pt returned to ADL apartment and set up with cognitive task to select groceries from a list (simulating ordering groceries online), add up the bill, and select money to pay.  Pt requires max cues attend to task and for awareness of purpose of task for cognition and problem solving as he kept repeating "once I leave here, I'll get the pins out of my foot and then I can put as much weight as I want to on it."  Pt returned to room at end of session and left upright with call bell in reach and needs met.   Therapy Documentation Precautions:  Precautions Precautions: Fall Required Braces or Orthoses: Other Brace/Splint Other Brace/Splint: CAM boot RLE, post op shoe on left Restrictions Weight Bearing Restrictions: Yes RLE Weight Bearing: Non weight bearing Other Position/Activity Restrictions: per DPM, pt able to bear weight through R heel for transfers only   See Function Navigator for Current Functional Status.  Therapy/Group: Individual Therapy  Jacinda Kanady E Penven-Crew 09/10/2016, 11:26 AM

## 2016-09-10 NOTE — Progress Notes (Signed)
[facility]     PROGRESS NOTE  Subjective/Complaints:   No issues overnite except pt felt chilly and had some itching in groin area No other c/os  ROS: Denies CP, SOB, N/V/D.  Objective: Vital Signs: Blood pressure 113/64, pulse 80, temperature 98.1 F (36.7 C), temperature source Oral, resp. rate 17, height 5\' 7"  (1.702 m), weight 117.4 kg (258 lb 14.9 oz), SpO2 97 %. No results found.  Recent Labs  09/09/16 0511  WBC 9.7  HGB 13.0  HCT 40.3  PLT 261    Recent Labs  09/09/16 0511  NA 136  K 4.6  CL 104  GLUCOSE 125*  BUN 20  CREATININE 1.05  CALCIUM 9.0   CBG (last 3)   Recent Labs  09/09/16 1636 09/09/16 2114 09/10/16 0633  GLUCAP 166* 151* 128*    Wt Readings from Last 3 Encounters:  09/09/16 117.4 kg (258 lb 14.9 oz)  08/31/16 117.6 kg (259 lb 4.2 oz)  08/08/15 129.3 kg (285 lb)    Physical Exam:  BP 113/64 (BP Location: Left Arm)   Pulse 80   Temp 98.1 F (36.7 C) (Oral)   Resp 17   Ht 5\' 7"  (1.702 m)   Wt 117.4 kg (258 lb 14.9 oz)   SpO2 97%   BMI 40.55 kg/m  Constitutional: He appears well-developed. Obese  HENT: Normocephalic and atraumatic.  Eyes: EOM are normal. No discharge.  Cardiovascular: Normal rate and regular rhythm.  No JVD. Respiratory: Effort normal and breath sounds normal.  GI: Soft. Bowel sounds are normal. Obese  Musculoskeletal: He exhibits no edema or tenderness.  Neurological: He is alert and oriented.  Mild right facial weakness.  Speech clear.  Able to follow commands.  Motor: RUE: 4/5 proximal to distal, mod/severe dysmetria and ataxia RLE: 5/5 proximally, boot distally LUE: 4+/5 proximal to distal LLE: 5/5 proximal to distal  Skin: no evidence of erythema in inguinal creases or scrotal area Psychiatric: normal affect today   Assessment/Plan: 1. Functional deficits secondary to B/l embolic MCA infarcts which require 3+ hours per day of interdisciplinary therapy in a  comprehensive inpatient rehab setting. Physiatrist is providing close team supervision and 24 hour management of active medical problems listed below. Physiatrist and rehab team continue to assess barriers to discharge/monitor patient progress toward functional and medical goals.  Function:  Bathing Bathing position   Position: Wheelchair/chair at sink  Bathing parts Body parts bathed by patient: Right arm, Left arm, Chest, Abdomen, Front perineal area, Right upper leg, Left upper leg Body parts bathed by helper: Buttocks, Back  Bathing assist Assist Level:  (Mod assist)      Upper Body Dressing/Undressing Upper body dressing   What is the patient wearing?: Pull over shirt/dress     Pull over shirt/dress - Perfomed by patient: Thread/unthread right sleeve, Thread/unthread left sleeve, Put head through opening, Pull shirt over trunk          Upper body assist Assist Level: Set up   Set up : To obtain clothing/put away  Lower Body Dressing/Undressing Lower body dressing   What is the patient wearing?: Pants, AFO, Non-skid slipper socks     Pants- Performed by patient: Thread/unthread right pants leg, Thread/unthread left pants leg, Pull pants up/down Pants- Performed by helper: Thread/unthread right pants leg, Pull pants up/down   Non-skid slipper socks- Performed by helper: Don/doff left sock           AFO - Performed by helper: Don/doff  right AFO, Don/doff left AFO      Lower body assist Assist for lower body dressing:  (Mod assist)      Toileting Toileting   Toileting steps completed by patient: Adjust clothing prior to toileting, Performs perineal hygiene, Adjust clothing after toileting Toileting steps completed by helper: Performs perineal hygiene, Adjust clothing after toileting Toileting Assistive Devices: Grab bar or rail  Toileting assist Assist level: Touching or steadying assistance (Pt.75%)   Transfers Chair/bed transfer   Chair/bed transfer method:  Ambulatory Chair/bed transfer assist level: Touching or steadying assistance (Pt > 75%) Chair/bed transfer assistive device: Armrests, Other (knee scooter)     Locomotion Ambulation     Max distance: 150 Assist level: Supervision or verbal cues   Wheelchair   Type: Manual Max wheelchair distance: 300 Assist Level: Supervision or verbal cues  Cognition Comprehension Comprehension assist level: Understands basic 75 - 89% of the time/ requires cueing 10 - 24% of the time  Expression Expression assist level: Expresses basic 75 - 89% of the time/requires cueing 10 - 24% of the time. Needs helper to occlude trach/needs to repeat words.  Social Interaction Social Interaction assist level: Interacts appropriately 90% of the time - Needs monitoring or encouragement for participation or interaction.  Problem Solving Problem solving assist level: Solves basic 75 - 89% of the time/requires cueing 10 - 24% of the time  Memory Memory assist level: Recognizes or recalls 25 - 49% of the time/requires cueing 50 - 75% of the time    Medical Problem List and Plan: 1.  Limitations in self care, mobility deficits secondary to B/l embolic MCA infarcts.  CIR PT, OT SLP, discussed tent D/C 2/8 2.  DVT Prophylaxis/Anticoagulation: Pharmaceutical: Lovenox, resumed , on ASA for CVA prophyllaxis  , normal LE dopplers                3. Pain Management: tylenol prn.  4. Mood: LCSW to follow for evaluation and support.  Mild adjustment issues but denies anxiety/depression. Monitor for now.   5. Neuropsych: This patient is capable of making decisions on his own behalf. 6. Skin/Wound Care: routine pressure relief measures. Cleanse incision with normal saline and apply Gentamicin cream every other day. 7. Fluids/Electrolytes/Nutrition: Monitor I/O. Educate patient on heart healthy diet. 8. Right forefoot reconstruction: NWB with boot for support.   DPM note reviewed from 08/19/16, cont NWB and dressing change qod,  recheck 4 wks, pt states that his f/u appt was 1/31, Pins CDI, no erythema or drainage in toes, mild swelling of foot and ankle some indentation of ACE wrap at ankle 9. H/o Gout: Stable. Monitor for now . 10. Insomnia: managed by trazodone.  11.  New diagnosis Diabetes: Hgb A1c- 7.0. Consulted RD for education. Monitor BS ac/hs  Started low dose metformin.   Use SSI for elevated BS.    CBG (last 3)   Recent Labs  09/09/16 1636 09/09/16 2114 09/10/16 0633  GLUCAP 166* 151* 128*    12. Labile blood pressure, Resolved, no dizziness with standing   Vitals:   09/09/16 1403 09/10/16 0425  BP: 133/77 113/64  Pulse: 73 80  Resp: 18 17  Temp: 97.9 F (36.6 C) 98.1 F (36.7 C)   13. Hypoalbuminemia  Supplement initiated 1/25   LOS (Days) 8 A FACE TO FACE EVALUATION WAS PERFORMED  KIRSTEINS,ANDREW E 09/10/2016 7:03 AM

## 2016-09-10 NOTE — Progress Notes (Signed)
Occupational Therapy Weekly Progress Note  Patient Details  Name: Aaron Moon MRN: [id number] Date of Birth: 17-Mar-1946  Beginning of progress report period: September 03, 2016 End of progress report period: September 10, 2016  Today's Date: 09/10/2016 OT Individual Time: 9628-3662 and 1330-1415 OT Individual Time Calculation (min): 58 min and 45 min   Patient has met 4 of 4 short term goals.  Pt is making steady progress towards goals.  Pt's weight bearing status was clarified to allow weight bearing through Rt heel with sit > stand for transfers, increasing pt ability to complete transfers with use of knee walker.  Pt continues to require min-mod cues for sequencing with transfers due to mild impulsivity as well as decreased mental flexibility and problem solving.  Pt is demonstrating improved RUE motor control, still with ataxia at end ranges and decreased grasp and fine motor control, however able to incorporate functionally.  Pt will benefit from supervision upon d/c due to memory deficits and decreased safety awareness.  Patient continues to demonstrate the following deficits: muscle weakness, unbalanced muscle activation, ataxia, decreased coordination and decreased motor planning, decreased attention to left, decreased awareness, decreased problem solving, decreased safety awareness and decreased memory and decreased standing balance, hemiplegia, decreased balance strategies and difficulty maintaining precautions and therefore will continue to benefit from skilled OT intervention to enhance overall performance with BADL and Reduce care partner burden.  Patient progressing toward long term goals..  Continue plan of care.  OT Short Term Goals Week 1:  OT Short Term Goal 1 (Week 1): Pt will complete toilet transfers with min assist and LRAD OT Short Term Goal 1 - Progress (Week 1): Met OT Short Term Goal 2 (Week 1): Pt will complete sit > stand with mod assist while maintaining NWB status to  complete LB dressing/hygiene OT Short Term Goal 2 - Progress (Week 1): Partly met OT Short Term Goal 3 (Week 1): Pt will don underwear and pants with mod assist OT Short Term Goal 3 - Progress (Week 1): Met OT Short Term Goal 4 (Week 1): Pt will utilize RUE during bathing and dressing with min cues to incorporate OT Short Term Goal 4 - Progress (Week 1): Met Week 2:  OT Short Term Goal 1 (Week 2): STG = LTGs due to remaining LOS  Skilled Therapeutic Interventions/Progress Updates:    1) Treatment session with focus on transfers, sit > stand, and recall of techniques to increase safety post d/c.  Pt received seated at sink, declined shower until pins removed from foot.  Explained that therapist could wrap foot, however pt continuing to refuse.  Bathing and dressing completed at sink with setup assist to obtain items.  Pt deferred washing buttocks this session.  Required assist to don post op shoes on Rt and Lt foot due to decreased ability to reach feet from w/c.  Engaged in blocked practice of toilet transfers in ADL apt bathroom to simulate home setup.  Pt initially reporting only having grab bar on Lt of commode, then reporting having an elongated toilet seat riser with arms.  Completed transfers on/off toilet with just Lt grab bar with mod assist and then with BSC placed over top to simulate home setup with pt able to complete with min/steady assist. Pt continues to require min-mod cues for sequencing transfers and mobility with knee walker.    2) Treatment session with focus on functional problem solving and RUE NMR.  Pt received in w/c, propelled w/c to therapy gym as pt  still with concerns regarding RUE strength and motor control.  Completed stand pivot transfer with use of knee walker with min cues, pt initially attempting stand pivot transfers without AD but unable to complete full upright stance and reaching for therapist.  Completed transfers with knee walker with min assist and min cues for  sequencing.  Engaged in 3D pipe tree activity with focus on organization, sequencing, grasp, motor control and coordination with pt demonstrating improved mobility as well as improved awareness of errors, still requiring min cues and setup assist to correctly identify each size of pipe. Ambulated back to room with knee walker and min/steady assist for transfers and turns but supervision during ambulation.  Therapy Documentation Precautions:  Precautions Precautions: Fall Required Braces or Orthoses: Other Brace/Splint Other Brace/Splint: CAM boot RLE, post op shoe on left Restrictions Weight Bearing Restrictions: Yes RLE Weight Bearing: Non weight bearing (wt bearing to heel) Other Position/Activity Restrictions: per DPM, pt able to bear weight through R heel for transfers only General:   Vital Signs: Therapy Vitals Temp: 98.1 F (36.7 C) Temp Source: Oral Pulse Rate: 80 Resp: 17 BP: 113/64 Patient Position (if appropriate): Lying Oxygen Therapy SpO2: 97 % O2 Device: Not Delivered Pain:   ADL:   Exercises:   Other Treatments:    See Function Navigator for Current Functional Status.   Therapy/Group: Individual Therapy  Simonne Come 09/10/2016, 7:18 AM

## 2016-09-10 NOTE — Plan of Care (Signed)
Problem: RH BOWEL ELIMINATION Goal: RH STG MANAGE BOWEL WITH ASSISTANCE STG Manage Bowel with Assistance. Mod I   Outcome: Progressing Pt will require minimal assistance through all the steps in toileting

## 2016-09-11 ENCOUNTER — Inpatient Hospital Stay (HOSPITAL_COMMUNITY): Payer: Federal, State, Local not specified - PPO | Admitting: Physical Therapy

## 2016-09-11 ENCOUNTER — Inpatient Hospital Stay (HOSPITAL_COMMUNITY): Payer: Federal, State, Local not specified - PPO | Admitting: Occupational Therapy

## 2016-09-11 ENCOUNTER — Inpatient Hospital Stay (HOSPITAL_COMMUNITY): Payer: Federal, State, Local not specified - PPO | Admitting: Speech Pathology

## 2016-09-11 LAB — GLUCOSE, CAPILLARY
Glucose-Capillary: 144 mg/dL — ABNORMAL HIGH (ref 65–99)
Glucose-Capillary: 145 mg/dL — ABNORMAL HIGH (ref 65–99)
Glucose-Capillary: 129 mg/dL — ABNORMAL HIGH (ref 65–99)
Glucose-Capillary: 137 mg/dL — ABNORMAL HIGH (ref 65–99)

## 2016-09-11 NOTE — Progress Notes (Signed)
[place] PHYSICAL MEDICINE & REHABILITATION     PROGRESS NOTE  Subjective/Complaints:  No issues overnite except Left foot felt "numb"  No tingling or new weakness, no trauma to area or fall  ROS: Denies CP, SOB, N/V/D.  Objective: Vital Signs: Blood pressure 104/62, pulse 88, temperature 98 F (36.7 C), temperature source Oral, resp. rate 17, height 5\' 7"  (1.702 m), weight 117.4 kg (258 lb 14.9 oz), SpO2 99 %. No results found.  Recent Labs  09/09/16 0511  WBC 9.7  HGB 13.0  HCT 40.3  PLT 261    Recent Labs  09/09/16 0511  NA 136  K 4.6  CL 104  GLUCOSE 125*  BUN 20  CREATININE 1.05  CALCIUM 9.0   CBG (last 3)   Recent Labs  09/10/16 1640 09/10/16 2135 09/11/16 0630  GLUCAP 172* 187* 144*    Wt Readings from Last 3 Encounters:  09/09/16 117.4 kg (258 lb 14.9 oz)  08/31/16 117.6 kg (259 lb 4.2 oz)  08/08/15 129.3 kg (285 lb)    Physical Exam:  BP 104/62 (BP Location: Left Arm)   Pulse 88   Temp 98 F (36.7 C) (Oral)   Resp 17   Ht 5\' 7"  (1.702 m)   Wt 117.4 kg (258 lb 14.9 oz)   SpO2 99%   BMI 40.55 kg/m  Constitutional: He appears well-developed. Obese  HENT: Normocephalic and atraumatic.  Eyes: EOM are normal. No discharge.  Cardiovascular: Normal rate and regular rhythm.  No JVD. Respiratory: Effort normal and breath sounds normal.  GI: Soft. Bowel sounds are normal. Obese  Musculoskeletal: He exhibits no edema or tenderness.  Neurological: He is alert and oriented. Sensation normal to LT on left foot Mild right facial weakness.  Speech clear.  Able to follow commands.  Motor: RUE: 4/5 proximal to distal, mod/severe dysmetria and ataxia RLE: 5/5 proximally, boot distally LUE: 4+/5 proximal to distal LLE: 5/5 proximal to distal  Skin: no evidence of erythema in inguinal creases or scrotal area, left foot without lesions Psychiatric: normal affect today Ext mild edema left pedal no pain to palpation or with ROM  Assessment/Plan: 1.  Functional deficits secondary to B/l embolic MCA infarcts which require 3+ hours per day of interdisciplinary therapy in a comprehensive inpatient rehab setting. Physiatrist is providing close team supervision and 24 hour management of active medical problems listed below. Physiatrist and rehab team continue to assess barriers to discharge/monitor patient progress toward functional and medical goals.  Function:  Bathing Bathing position   Position: Wheelchair/chair at sink  Bathing parts Body parts bathed by patient: Right arm, Left arm, Chest, Abdomen, Front perineal area, Right upper leg, Left upper leg Body parts bathed by helper: Buttocks, Back  Bathing assist Assist Level: Set up      Upper Body Dressing/Undressing Upper body dressing   What is the patient wearing?: Pull over shirt/dress     Pull over shirt/dress - Perfomed by patient: Thread/unthread right sleeve, Thread/unthread left sleeve, Put head through opening, Pull shirt over trunk          Upper body assist Assist Level: Set up   Set up : To obtain clothing/put away  Lower Body Dressing/Undressing Lower body dressing   What is the patient wearing?: Pants     Pants- Performed by patient: Thread/unthread right pants leg, Thread/unthread left pants leg, Pull pants up/down Pants- Performed by helper: Thread/unthread right pants leg, Pull pants up/down   Non-skid slipper socks- Performed by helper: Don/doff  left sock           AFO - Performed by helper: Don/doff right AFO, Don/doff left AFO      Lower body assist Assist for lower body dressing:  (Mod assist)      Toileting Toileting   Toileting steps completed by patient: Adjust clothing prior to toileting, Performs perineal hygiene, Adjust clothing after toileting Toileting steps completed by helper: Performs perineal hygiene, Adjust clothing after toileting Toileting Assistive Devices: Grab bar or rail  Toileting assist Assist level: Touching or  steadying assistance (Pt.75%)   Transfers Chair/bed transfer   Chair/bed transfer method: Ambulatory Chair/bed transfer assist level: Touching or steadying assistance (Pt > 75%) Chair/bed transfer assistive device: Armrests, Other (knee scooter)     Locomotion Ambulation     Max distance: 150 Assist level: Supervision or verbal cues   Wheelchair   Type: Manual Max wheelchair distance: >300 Assist Level: Supervision or verbal cues  Cognition Comprehension Comprehension assist level: Understands basic 75 - 89% of the time/ requires cueing 10 - 24% of the time  Expression Expression assist level: Expresses basic 90% of the time/requires cueing < 10% of the time.  Social Interaction Social Interaction assist level: Interacts appropriately 90% of the time - Needs monitoring or encouragement for participation or interaction.  Problem Solving Problem solving assist level: Solves basic 75 - 89% of the time/requires cueing 10 - 24% of the time  Memory Memory assist level: Recognizes or recalls 75 - 89% of the time/requires cueing 10 - 24% of the time    Medical Problem List and Plan: 1.  Limitations in self care, mobility deficits secondary to B/l embolic MCA infarcts.  CIR PT, OT SLP, cont CIR, working on fall risk reduction 2.  DVT Prophylaxis/Anticoagulation: Pharmaceutical: Lovenox, resumed , on ASA for CVA prophyllaxis  , normal LE dopplers                3. Pain Management: tylenol prn.  4. Mood: LCSW to follow for evaluation and support.  Mild adjustment issues but denies anxiety/depression. Monitor for now.   5. Neuropsych: This patient is capable of making decisions on his own behalf. 6. Skin/Wound Care: routine pressure relief measures. Cleanse incision with normal saline and apply Gentamicin cream every other day. 7. Fluids/Electrolytes/Nutrition: Monitor I/O. Educate patient on heart healthy diet. 8. Right forefoot reconstruction: NWB with boot for support.   DPM note reviewed  from 08/19/16, cont NWB and dressing change qod, recheck 4 wks, pt states that his f/u appt was 1/31, Pins CDI, no erythema or drainage in toes, mild swelling of foot and ankle some indentation of ACE wrap at ankle 9. H/o Gout: Stable. Monitor for now . 10. Insomnia: managed by trazodone.  11.  New diagnosis Diabetes: Hgb A1c- 7.0. Consulted RD for education. Monitor BS ac/hs  Started low dose metformin.   Use SSI for elevated BS.    CBG (last 3)   Recent Labs  09/10/16 1640 09/10/16 2135 09/11/16 0630  GLUCAP 172* 187* 144*     12. Hypoalbuminemia  Supplement initiated 1/25  13.  Foot "numbness" actually swelling , is sensate , add TEDs LLE LOS (Days) 9 A FACE TO FACE EVALUATION WAS PERFORMED  Erick ColaceKIRSTEINS,Khaliyah Northrop E 09/11/2016 6:36 AM

## 2016-09-11 NOTE — Plan of Care (Signed)
Problem: RH SKIN INTEGRITY Goal: RH STG ABLE TO PERFORM INCISION/WOUND CARE W/ASSISTANCE STG Able To Perform Incision/Wound Care With min Assistance.  Educate during dressing changes, allow pt involvement and increasing independence.

## 2016-09-11 NOTE — Progress Notes (Signed)
Occupational Therapy Session Note  Patient Details  Name: Aaron Moon MRN: [id number] Date of Birth: 08/22/1945  Today's Date: 09/11/2016 OT Individual Time: 0800-0930 and 1330-1400 OT Individual Time Calculation (min): 90 min and 30 min   Short Term Goals: Week 2:  OT Short Term Goal 1 (Week 2): STG = LTGs due to remaining LOS  Skilled Therapeutic Interventions/Progress Updates:    1) Treatment session with focus on sequencing and problem solving during functional tasks of bathing/dressing and maneuvering in home environment.  Pt completed grooming and bathing and dressing at overall supervision - setup level at sink.  Pt requiring min cues for safe technique with sit > stand to pull up pants as pt initially planning to leave RLE on w/c foot rest.  Completed transfer w/c > recliner with use of knee walker with steady assist and min cues for sequencing.  Donned post op shoes from recliner with increased time.  Ambulated to ADL apt kitchen with knee walker with supervision.  Engaged in problem solving and awareness in ADL kitchen with pt demonstrating decreased problem solving, mental flexibility, and awareness of deficits with safety while maneuvering around with knee walker.  Explained rationale for supervision with ADLs and iADLs with pt expressing concerns d/t decreased family support and needing to hire someone.  Pt also continues to perseverate on the fact that he will be able to weight bear through Rt foot once pins removed and seems to think that all will be better after that, therefore seems to dismiss some of the education from therapist.  2) Treatment session with focus on RUE motor control.  Pt continues to report c/o decreased strength and control in RUE.  Engaged in ball toss in unsupported sitting with focus on trunk control and symmetrical use of BUE.  Pt demonstrating decreased motor control and ataxia in RUE with ball toss to therapist, requiring mod cues to attend to RUE to attempt  to increase control with minimal improvement.  Pt engaged in bouncing tennis ball from Rt to Lt hand with difficulty coordinating catch with Rt hand.  Applied 1/2# weight to Rt wrist with improvements in motor control.  Noted improvements in control with weight removed.  Returned to room and left upright in w/c with all needs in reach.  Therapy Documentation Precautions:  Precautions Precautions: Fall Required Braces or Orthoses: Other Brace/Splint Other Brace/Splint: CAM boot RLE, post op shoe on left Restrictions Weight Bearing Restrictions: Yes RLE Weight Bearing: Non weight bearing Other Position/Activity Restrictions: per DPM, pt able to bear weight through R heel for transfers only General:   Vital Signs:   Pain:   ADL:   Exercises:   Other Treatments:    See Function Navigator for Current Functional Status.   Therapy/Group: Individual Therapy  Rosalio LoudHOXIE, Sheridan Hew 09/11/2016, 8:34 AM

## 2016-09-11 NOTE — Progress Notes (Signed)
Speech Language Pathology Daily Session Note  Patient Details  Name: Aaron SouDana Moon MRN: [id number] Date of Birth: 1945-10-03  Today's Date: 09/11/2016 SLP Individual Time: 1435-1505 SLP Individual Time Calculation (min): 30 min  Short Term Goals: Week 2: SLP Short Term Goal 1 (Week 2): Patient will utilize external memory aids to recall new, daily information with Min A verbal and visual cues.  SLP Short Term Goal 2 (Week 2): Patient will demonstrate functional problem solving with basic and familiar tasks with supervision verbal cues.  SLP Short Term Goal 3 (Week 2): Patient will self-monitor and correct errors during functional tasks with supervision verbal cues.  SLP Short Term Goal 4 (Week 2): Patient will follow complex tasks/directions with Mod I.   Skilled Therapeutic Interventions: Skilled treatment session focused on cognitive goals. SLP facilitated session by providing Min A verbal cues for word-finding during a structured, mildly complex task and Mod A verbal cues to self-monitor and correct errors with written expression in regards to perseveration on letters at the word level. Patient propelled himself to and from the SLP office with extra time. Patient left upright in wheelchair with all needs within reach. Continue with current plan of care.      Function:  Cognition Comprehension Comprehension assist level: Understands basic 75 - 89% of the time/ requires cueing 10 - 24% of the time  Expression   Expression assist level: Expresses basic 90% of the time/requires cueing < 10% of the time.  Social Interaction Social Interaction assist level: Interacts appropriately 90% of the time - Needs monitoring or encouragement for participation or interaction.  Problem Solving Problem solving assist level: Solves basic 75 - 89% of the time/requires cueing 10 - 24% of the time  Memory Memory assist level: Recognizes or recalls 75 - 89% of the time/requires cueing 10 - 24% of the time     Pain No/Denies Pain   Therapy/Group: Individual Therapy  [name] 09/11/2016, 3:09 PM

## 2016-09-11 NOTE — Progress Notes (Signed)
Physical Therapy Session Note  Patient Details  Name: Aaron Moon MRN: [id number] Date of Birth: 1945/09/22  Today's Date: 09/11/2016 PT Individual Time: 1000-1100 PT Individual Time Calculation (min): 60 min   Short Term Goals: Week 2:  PT Short Term Goal 1 (Week 2): =LTGs due to ELOS  Skilled Therapeutic Interventions/Progress Updates:    no c/o pain.  Session focus on w/c propulsion, activity tolerance, gait with knee scooter, transfers with knee scooter, and RUE NMR.  Pt propelled w/c to day room mod I.  Sit<>stand with knee scooter throughout session with supervision and min verbal cues.  Pt is improving with ability to recall steps for transfers in familiar set-ups but continues to have difficulty generalizing to new situations.  Gait training x300' with knee scooter and supervision.  Pt engaged in resistive clothespin activity focus on RUE strengthening and coordination with dynamic sitting balance component reaching outside BOS.  Pt completed transfer on/off bed with knee scooter and mod/max cues for set up in unfamiliar situation.  Pt returned to room in w/c at end of session and positioned upright in w/c with call bell in reach and needs met.    Therapy Documentation Precautions:  Precautions Precautions: Fall Required Braces or Orthoses: Other Brace/Splint Other Brace/Splint: CAM boot RLE, post op shoe on left Restrictions Weight Bearing Restrictions: Yes RLE Weight Bearing: Non weight bearing Other Position/Activity Restrictions: per DPM, pt able to bear weight through R heel for transfers only   See Function Navigator for Current Functional Status.   Therapy/Group: Individual Therapy  Earnest Conroy Penven-Crew 09/11/2016, 12:24 PM

## 2016-09-11 NOTE — Plan of Care (Signed)
Problem: RH SAFETY Goal: RH STG ADHERE TO SAFETY PRECAUTIONS W/ASSISTANCE/DEVICE STG Adhere to Safety Precautions With Assistance/Device. Supervision  Remind pt to call for assistance w/transferring.

## 2016-09-12 ENCOUNTER — Inpatient Hospital Stay (HOSPITAL_COMMUNITY): Payer: Federal, State, Local not specified - PPO | Admitting: Occupational Therapy

## 2016-09-12 LAB — GLUCOSE, CAPILLARY
GLUCOSE-CAPILLARY: 136 mg/dL — AB (ref 65–99)
Glucose-Capillary: 142 mg/dL — ABNORMAL HIGH (ref 65–99)
Glucose-Capillary: 121 mg/dL — ABNORMAL HIGH (ref 65–99)
Glucose-Capillary: 190 mg/dL — ABNORMAL HIGH (ref 65–99)

## 2016-09-12 NOTE — Progress Notes (Signed)
Occupational Therapy Session Note  Patient Details  Name: Aaron Moon MRN: [id number] Date of Birth: 10/02/1945  Today's Date: 09/12/2016 OT Individual Time: 0800-0900 OT Individual Time Calculation (min): 60 min    Short Term Goals: Week 2:  OT Short Term Goal 1 (Week 2): STG = LTGs due to remaining LOS  Skilled Therapeutic Interventions/Progress Updates: 1:1 SElf care retraining at shower level. Pt able to perform bed mobility mod I to come to EOB. Pt's right foot wrapped. Pt able to ambulate with knee scooter with min A. Pt still requires min cues for functional problem solving for how to set up knee scooter for transfer from seated position to standing. Pt bathed sit to stand with min A for left foot and bottom with extra time. Again pt required A to transition onto the knee scooter. Pt also continues to require cuing to only weight bear through heel of right foot. Pt able to don clothing with extra time but difficulty with standing balance to pull up pants without UE support. Tried standing and putting right knee in knee scooter but requires A to maintain scooter brake on while pulling up pants with minA. Completed grooming at sink with setup.     Therapy Documentation Precautions:  Precautions Precautions: Fall Required Braces or Orthoses: Other Brace/Splint Other Brace/Splint: CAM boot RLE, post op shoe on left Restrictions Weight Bearing Restrictions: Yes RLE Weight Bearing: Non weight bearing Other Position/Activity Restrictions: per DPM, pt able to bear weight through R heel for transfers only Pain: No c/o pain in session  See Function Navigator for Current Functional Status.   Therapy/Group: Individual Therapy  Roney MansSmith, [name] [facility] 09/12/2016, 8:27 AM

## 2016-09-12 NOTE — Progress Notes (Signed)
[place] PHYSICAL MEDICINE & REHABILITATION     PROGRESS NOTE  Subjective/Complaints:  No new issues overnite, tolerating therapy  ROS: Denies CP, SOB, N/V/D.  Objective: Vital Signs: Blood pressure 128/63, pulse 78, temperature 98.1 F (36.7 C), temperature source Oral, resp. rate 16, height 5\' 7"  (1.702 m), weight 117.4 kg (258 lb 14.9 oz), SpO2 98 %. No results found. No results for input(s): WBC, HGB, HCT, PLT in the last 72 hours. No results for input(s): NA, K, CL, GLUCOSE, BUN, CREATININE, CALCIUM in the last 72 hours.  Invalid input(s): CO CBG (last 3)   Recent Labs  09/11/16 1143 09/11/16 1704 09/11/16 2056  GLUCAP 145* 129* 137*    Wt Readings from Last 3 Encounters:  09/09/16 117.4 kg (258 lb 14.9 oz)  08/31/16 117.6 kg (259 lb 4.2 oz)  08/08/15 129.3 kg (285 lb)    Physical Exam:  BP 128/63 (BP Location: Left Arm)   Pulse 78   Temp 98.1 F (36.7 C) (Oral)   Resp 16   Ht 5\' 7"  (1.702 m)   Wt 117.4 kg (258 lb 14.9 oz)   SpO2 98%   BMI 40.55 kg/m  Constitutional: He appears well-developed. Obese  HENT: Normocephalic and atraumatic.  Eyes: EOM are normal. No discharge.  Cardiovascular: Normal rate and regular rhythm.  No JVD. Respiratory: Effort normal and breath sounds normal.  GI: Soft. Bowel sounds are normal. Obese  Musculoskeletal: He exhibits no edema or tenderness.  Neurological: He is alert and oriented. Sensation normal to LT on left foot Mild right facial weakness.  Speech clear.  Able to follow commands.  Motor: RUE: 4/5 proximal to distal, mod/severe dysmetria and ataxia RLE: 5/5 proximally, boot distally LUE: 4+/5 proximal to distal LLE: 5/5 proximal to distal  Skin: no evidence of erythema in inguinal creases or scrotal area, left foot without lesions Psychiatric: normal affect today Ext mild edema left pedal no pain to palpation or with ROM  Assessment/Plan: 1. Functional deficits secondary to B/l embolic MCA infarcts which  require 3+ hours per day of interdisciplinary therapy in a comprehensive inpatient rehab setting. Physiatrist is providing close team supervision and 24 hour management of active medical problems listed below. Physiatrist and rehab team continue to assess barriers to discharge/monitor patient progress toward functional and medical goals.  Function:  Bathing Bathing position   Position: Wheelchair/chair at sink  Bathing parts Body parts bathed by patient: Right arm, Left arm, Chest, Abdomen, Front perineal area, Right upper leg, Left upper leg Body parts bathed by helper: Buttocks, Back  Bathing assist Assist Level: Set up      Upper Body Dressing/Undressing Upper body dressing   What is the patient wearing?: Pull over shirt/dress     Pull over shirt/dress - Perfomed by patient: Thread/unthread right sleeve, Thread/unthread left sleeve, Put head through opening, Pull shirt over trunk          Upper body assist Assist Level: Set up   Set up : To obtain clothing/put away  Lower Body Dressing/Undressing Lower body dressing   What is the patient wearing?: Pants     Pants- Performed by patient: Thread/unthread right pants leg, Thread/unthread left pants leg, Pull pants up/down Pants- Performed by helper: Thread/unthread right pants leg, Pull pants up/down   Non-skid slipper socks- Performed by helper: Don/doff left sock           AFO - Performed by helper: Don/doff right AFO, Don/doff left AFO      Lower body  assist Assist for lower body dressing:  (Mod assist)      Toileting Toileting   Toileting steps completed by patient: Adjust clothing prior to toileting, Performs perineal hygiene, Adjust clothing after toileting Toileting steps completed by helper: Performs perineal hygiene, Adjust clothing after toileting Toileting Assistive Devices: Grab bar or rail  Toileting assist Assist level: Set up/obtain supplies   Transfers Chair/bed transfer   Chair/bed transfer  method: Ambulatory Chair/bed transfer assist level: Supervision or verbal cues Chair/bed transfer assistive device: Armrests, Other (knee scooter)     Locomotion Ambulation     Max distance: 300 Assist level: Supervision or verbal cues   Wheelchair   Type: Manual Max wheelchair distance: >300 Assist Level: No help, No cues, assistive device, takes more than reasonable amount of time  Cognition Comprehension Comprehension assist level: Understands basic 75 - 89% of the time/ requires cueing 10 - 24% of the time  Expression Expression assist level: Expresses basic 90% of the time/requires cueing < 10% of the time.  Social Interaction Social Interaction assist level: Interacts appropriately 90% of the time - Needs monitoring or encouragement for participation or interaction.  Problem Solving Problem solving assist level: Solves basic 75 - 89% of the time/requires cueing 10 - 24% of the time  Memory Memory assist level: Recognizes or recalls 75 - 89% of the time/requires cueing 10 - 24% of the time    Medical Problem List and Plan: 1.  Limitations in self care, mobility deficits secondary to B/l embolic MCA infarcts.  CIR PT, OT SLP, cont CIR,  2.  DVT Prophylaxis/Anticoagulation: Pharmaceutical: Lovenox, resumed , on ASA for CVA prophyllaxis  , normal LE dopplers                3. Pain Management: tylenol prn.  4. Mood: LCSW to follow for evaluation and support.  Mild adjustment issues but denies anxiety/depression. Monitor for now.   5. Neuropsych: This patient is capable of making decisions on his own behalf. 6. Skin/Wound Care: routine pressure relief measures. Cleanse incision with normal saline and apply Gentamicin cream every other day. 7. Fluids/Electrolytes/Nutrition: Monitor I/O. Educate patient on heart healthy diet. 8. Right forefoot reconstruction: NWB with boot for support.   DPM note reviewed from 08/19/16, cont NWB and dressing change qod, recheck 4 wks, pt states that his  f/u appt was 1/31, Pins CDI, no erythema or drainage in toes, mild swelling of foot and ankle some indentation of ACE wrap at ankle 9. H/o Gout: Stable. Monitor for now . 10. Insomnia: managed by trazodone.  11.  New diagnosis Diabetes: Hgb A1c- 7.0. Consulted RD for education. Monitor BS ac/hs  Started low dose metformin. CBG only mildly elevated  Use SSI for elevated BS.    CBG (last 3)   Recent Labs  09/11/16 1143 09/11/16 1704 09/11/16 2056  GLUCAP 145* 129* 137*     12. Hypoalbuminemia  Supplement initiated 1/25  13.  Foot "numbness" actually swelling , is sensate , add TEDs LLE LOS (Days) 10 A FACE TO FACE EVALUATION WAS PERFORMED  Erick ColaceKIRSTEINS,Aloysius Heinle E 09/12/2016 6:19 AM

## 2016-09-12 NOTE — Plan of Care (Signed)
Problem: RH Balance Goal: LTG Patient will maintain dynamic standing with ADLs (OT) LTG:  Patient will maintain dynamic standing balance with assist during activities of daily living (OT)   downgraded to go along with other functional goals JLS

## 2016-09-13 ENCOUNTER — Inpatient Hospital Stay (HOSPITAL_COMMUNITY): Payer: Federal, State, Local not specified - PPO | Admitting: Physical Therapy

## 2016-09-13 DIAGNOSIS — E1142 Type 2 diabetes mellitus with diabetic polyneuropathy: Secondary | ICD-10-CM

## 2016-09-13 LAB — GLUCOSE, CAPILLARY
GLUCOSE-CAPILLARY: 121 mg/dL — AB (ref 65–99)
Glucose-Capillary: 119 mg/dL — ABNORMAL HIGH (ref 65–99)
Glucose-Capillary: 193 mg/dL — ABNORMAL HIGH (ref 65–99)
Glucose-Capillary: 186 mg/dL — ABNORMAL HIGH (ref 65–99)

## 2016-09-13 MED ORDER — GABAPENTIN 300 MG PO CAPS
300.0000 mg | ORAL_CAPSULE | Freq: Every day | ORAL | Status: DC
  Administered 2016-09-13: 300 mg via ORAL
  Filled 2016-09-13: qty 1

## 2016-09-13 NOTE — Progress Notes (Signed)
Physical Therapy Session Note  Patient Details  Name: Aaron Moon MRN: [id number] Date of Birth: January 23, 1946  Today's Date: 09/13/2016 PT Individual Time: 0800-0900 PT Individual Time Calculation (min): 60 min   Short Term Goals: Week 1:  PT Short Term Goal 1 (Week 1): pt will transfer with LRAD and min assist PT Short Term Goal 1 - Progress (Week 1): Met PT Short Term Goal 2 (Week 1): Pt will ambulate with LRAD x150' and min guard PT Short Term Goal 2 - Progress (Week 1): Met PT Short Term Goal 3 (Week 1): Pt will negotiate 4 steps with mod assist PT Short Term Goal 3 - Progress (Week 1): Not met  Skilled Therapeutic Interventions/Progress Updates:  Pt was seen bedside in the am. Pt propelled w/c to gym with no assist, just required increased time. Pt performed multiple sit to stand transfers with knee scooter and S with verbal cues. Pt ambulated 300 and 150 feet with knee scooter and S with verbal cues. Pt performed LE exercises, 3 sets x 10 reps each with 3 lbs for LAQs and hip flex. Pt returned to room and left sitting in up in w/c with call bell within reach.   Therapy Documentation Precautions:  Precautions Precautions: Fall Required Braces or Orthoses: Other Brace/Splint Other Brace/Splint: CAM boot RLE, post op shoe on left Restrictions Weight Bearing Restrictions: Yes RLE Weight Bearing: Non weight bearing Other Position/Activity Restrictions: per DPM, pt able to bear weight through R heel for transfers only General:   Pain: Pt c/o 4/10 R foot pain, medicated prior to therapy.   See Function Navigator for Current Functional Status.   Therapy/Group: Individual Therapy  Dub Amis 09/13/2016, 12:40 PM

## 2016-09-13 NOTE — Progress Notes (Signed)
[place] PHYSICAL MEDICINE & REHABILITATION     PROGRESS NOTE  Subjective/Complaints:    ROS: Denies CP, SOB, N/V/D.  Objective: Vital Signs: Blood pressure (!) 126/94, pulse 91, temperature 98 F (36.7 C), temperature source Oral, resp. rate 16, height 5\' 7"  (1.702 m), weight 117.4 kg (258 lb 14.9 oz), SpO2 95 %. No results found. No results for input(s): WBC, HGB, HCT, PLT in the last 72 hours. No results for input(s): NA, K, CL, GLUCOSE, BUN, CREATININE, CALCIUM in the last 72 hours.  Invalid input(s): CO CBG (last 3)   Recent Labs  09/12/16 1633 09/12/16 2136 09/13/16 0622  GLUCAP 121* 190* 119*    Wt Readings from Last 3 Encounters:  09/09/16 117.4 kg (258 lb 14.9 oz)  08/31/16 117.6 kg (259 lb 4.2 oz)  08/08/15 129.3 kg (285 lb)    Physical Exam:  BP (!) 126/94 (BP Location: Right Arm)   Pulse 91   Temp 98 F (36.7 C) (Oral)   Resp 16   Ht 5\' 7"  (1.702 m)   Wt 117.4 kg (258 lb 14.9 oz)   SpO2 95%   BMI 40.55 kg/m  Constitutional: He appears well-developed. Obese  HENT: Normocephalic and atraumatic.  Eyes: EOM are normal. No discharge.  Cardiovascular: Normal rate and regular rhythm.  No JVD. Respiratory: Effort normal and breath sounds normal.  GI: Soft. Bowel sounds are normal. Obese  Musculoskeletal: He exhibits no edema or tenderness.  Neurological: He is alert and oriented. Sensation normal to LT on left foot Mild right facial weakness.  Speech clear.  Able to follow commands.  Motor: RUE: 4/5 proximal to distal, mod/severe dysmetria and ataxia RLE: 5/5 proximally, boot distally LUE: 4+/5 proximal to distal LLE: 5/5 proximal to distal  Skin: no evidence of erythema in inguinal creases or scrotal area, left foot without lesions Psychiatric: normal affect today Ext mild edema left pedal no pain to palpation or with ROM  Assessment/Plan: 1. Functional deficits secondary to B/l embolic MCA infarcts which require 3+ hours per day of  interdisciplinary therapy in a comprehensive inpatient rehab setting. Physiatrist is providing close team supervision and 24 hour management of active medical problems listed below. Physiatrist and rehab team continue to assess barriers to discharge/monitor patient progress toward functional and medical goals.  Function:  Bathing Bathing position   Position: Shower  Bathing parts Body parts bathed by patient: Right arm, Left arm, Chest, Abdomen, Front perineal area, Right upper leg, Left upper leg Body parts bathed by helper: Back, Left lower leg, Buttocks  Bathing assist Assist Level: Touching or steadying assistance(Pt > 75%)      Upper Body Dressing/Undressing Upper body dressing   What is the patient wearing?: Pull over shirt/dress     Pull over shirt/dress - Perfomed by patient: Thread/unthread right sleeve, Thread/unthread left sleeve, Put head through opening, Pull shirt over trunk          Upper body assist Assist Level: Set up   Set up : To obtain clothing/put away  Lower Body Dressing/Undressing Lower body dressing   What is the patient wearing?: Pants, AFO, Socks     Pants- Performed by patient: Thread/unthread right pants leg, Thread/unthread left pants leg, Pull pants up/down Pants- Performed by helper: Thread/unthread right pants leg, Pull pants up/down   Non-skid slipper socks- Performed by helper: Don/doff left sock           AFO - Performed by helper: Don/doff left AFO, Don/doff right AFO  Lower body assist Assist for lower body dressing: Touching or steadying assistance (Pt > 75%)      Toileting Toileting   Toileting steps completed by patient: Adjust clothing prior to toileting, Performs perineal hygiene, Adjust clothing after toileting Toileting steps completed by helper: Performs perineal hygiene, Adjust clothing after toileting Toileting Assistive Devices: Grab bar or rail  Toileting assist Assist level: Set up/obtain supplies    Transfers Chair/bed transfer   Chair/bed transfer method: Ambulatory Chair/bed transfer assist level: Supervision or verbal cues Chair/bed transfer assistive device: Armrests, Other (knee scooter)     Locomotion Ambulation     Max distance: 300 Assist level: Supervision or verbal cues   Wheelchair   Type: Manual Max wheelchair distance: >300 Assist Level: No help, No cues, assistive device, takes more than reasonable amount of time  Cognition Comprehension Comprehension assist level: Understands basic 75 - 89% of the time/ requires cueing 10 - 24% of the time  Expression Expression assist level: Expresses basic 90% of the time/requires cueing < 10% of the time.  Social Interaction Social Interaction assist level: Interacts appropriately 90% of the time - Needs monitoring or encouragement for participation or interaction.  Problem Solving Problem solving assist level: Solves basic 75 - 89% of the time/requires cueing 10 - 24% of the time  Memory Memory assist level: Recognizes or recalls 75 - 89% of the time/requires cueing 10 - 24% of the time    Medical Problem List and Plan: 1.  Limitations in self care, mobility deficits secondary to B/l embolic MCA infarcts.  CIR PT, OT SLP, cont CIR,  2.  DVT Prophylaxis/Anticoagulation: Pharmaceutical: Lovenox, resumed , on ASA for CVA prophyllaxis  , normal LE dopplers                3. Pain Management: tylenol prn.  4. Mood: LCSW to follow for evaluation and support.  Mild adjustment issues but denies anxiety/depression. Monitor for now.   5. Neuropsych: This patient is capable of making decisions on his own behalf. 6. Skin/Wound Care: routine pressure relief measures. Cleanse incision with normal saline and apply Gentamicin cream every other day. 7. Fluids/Electrolytes/Nutrition: Monitor I/O. Educate patient on heart healthy diet. 8. Right forefoot reconstruction: NWB DARCO sandal  DPM note reviewed from 08/19/16, cont NWB and dressing  change qod, recheck 4 wks, pt states that his f/u appt was 1/31, Pins CDI, no erythema or drainage in toes, mild swelling of foot and ankle some indentation of ACE wrap at ankle 9. H/o Gout: Stable. Monitor for now . 10. Insomnia: managed by trazodone.  11.  New diagnosis Diabetes: Hgb A1c- 7.0. Consulted RD for education. Monitor BS ac/hs  Started low dose metformin. CBG only mildly elevated in pm  Use SSI for elevated BS.    CBG (last 3)   Recent Labs  09/12/16 1633 09/12/16 2136 09/13/16 0622  GLUCAP 121* 190* 119*     12. Hypoalbuminemia  Supplement initiated 1/25  13.  Foot "numbness", now complaining of bilateral foot pain at noc, currently on gabapentin 100mg  TID will change to 300mg  Qhs LOS (Days) 11 A FACE TO FACE EVALUATION WAS PERFORMED  Giani Betzold E 09/13/2016 6:55 AM

## 2016-09-14 ENCOUNTER — Inpatient Hospital Stay (HOSPITAL_COMMUNITY): Payer: Federal, State, Local not specified - PPO | Admitting: Occupational Therapy

## 2016-09-14 ENCOUNTER — Inpatient Hospital Stay (HOSPITAL_COMMUNITY): Payer: Federal, State, Local not specified - PPO | Admitting: Speech Pathology

## 2016-09-14 ENCOUNTER — Inpatient Hospital Stay (HOSPITAL_COMMUNITY): Payer: Federal, State, Local not specified - PPO | Admitting: Physical Therapy

## 2016-09-14 LAB — GLUCOSE, CAPILLARY
GLUCOSE-CAPILLARY: 143 mg/dL — AB (ref 65–99)
GLUCOSE-CAPILLARY: 140 mg/dL — AB (ref 65–99)
Glucose-Capillary: 139 mg/dL — ABNORMAL HIGH (ref 65–99)
Glucose-Capillary: 120 mg/dL — ABNORMAL HIGH (ref 65–99)

## 2016-09-14 MED ORDER — GABAPENTIN 400 MG PO CAPS
400.0000 mg | ORAL_CAPSULE | Freq: Every day | ORAL | Status: DC
  Administered 2016-09-14 – 2016-09-16 (×3): 400 mg via ORAL
  Filled 2016-09-14 (×3): qty 1

## 2016-09-14 NOTE — Progress Notes (Signed)
[place] PHYSICAL MEDICINE & REHABILITATION     PROGRESS NOTE  Subjective/Complaints:   Neuropathy pain in feet somewhat better but still disturbed sleep  ROS: Denies CP, SOB, N/V/D.  Objective: Vital Signs: Blood pressure (!) 111/55, pulse 80, temperature 98.4 F (36.9 C), temperature source Oral, resp. rate 16, height 5\' 7"  (1.702 m), weight 117.4 kg (258 lb 14.9 oz), SpO2 93 %. No results found. No results for input(s): WBC, HGB, HCT, PLT in the last 72 hours. No results for input(s): NA, K, CL, GLUCOSE, BUN, CREATININE, CALCIUM in the last 72 hours.  Invalid input(s): CO CBG (last 3)   Recent Labs  09/13/16 1655 09/13/16 2056 09/14/16 0644  GLUCAP 193* 186* 143*    Wt Readings from Last 3 Encounters:  09/09/16 117.4 kg (258 lb 14.9 oz)  08/31/16 117.6 kg (259 lb 4.2 oz)  08/08/15 129.3 kg (285 lb)    Physical Exam:  BP (!) 111/55 (BP Location: Left Arm)   Pulse 80   Temp 98.4 F (36.9 C) (Oral)   Resp 16   Ht 5\' 7"  (1.702 m)   Wt 117.4 kg (258 lb 14.9 oz)   SpO2 93%   BMI 40.55 kg/m  Constitutional: He appears well-developed. Obese  HENT: Normocephalic and atraumatic.  Eyes: EOM are normal. No discharge.  Cardiovascular: Normal rate and regular rhythm.  No JVD. Respiratory: Effort normal and breath sounds normal.  GI: Soft. Bowel sounds are normal. Obese  Musculoskeletal: He exhibits no edema or tenderness.  Neurological: He is alert and oriented. Sensation normal to LT on left foot Mild right facial weakness.  Speech clear.  Able to follow commands.  Motor: RUE: 4/5 proximal to distal, mod/severe dysmetria and ataxia RLE: 5/5 proximally, boot distally LUE: 4+/5 proximal to distal LLE: 5/5 proximal to distal  Skin: no evidence of erythema in inguinal creases or scrotal area, left foot without lesions Psychiatric: normal affect today Ext mild edema left pedal no pain to palpation or with ROM  Assessment/Plan: 1. Functional deficits secondary to  B/l embolic MCA infarcts which require 3+ hours per day of interdisciplinary therapy in a comprehensive inpatient rehab setting. Physiatrist is providing close team supervision and 24 hour management of active medical problems listed below. Physiatrist and rehab team continue to assess barriers to discharge/monitor patient progress toward functional and medical goals.  Function:  Bathing Bathing position   Position: Shower  Bathing parts Body parts bathed by patient: Right arm, Left arm, Chest, Abdomen, Front perineal area, Right upper leg, Left upper leg Body parts bathed by helper: Back, Left lower leg, Buttocks  Bathing assist Assist Level: Touching or steadying assistance(Pt > 75%)      Upper Body Dressing/Undressing Upper body dressing   What is the patient wearing?: Pull over shirt/dress     Pull over shirt/dress - Perfomed by patient: Thread/unthread right sleeve, Thread/unthread left sleeve, Put head through opening, Pull shirt over trunk          Upper body assist Assist Level: Set up   Set up : To obtain clothing/put away  Lower Body Dressing/Undressing Lower body dressing   What is the patient wearing?: Pants, AFO, Socks     Pants- Performed by patient: Thread/unthread right pants leg, Thread/unthread left pants leg, Pull pants up/down Pants- Performed by helper: Thread/unthread right pants leg, Pull pants up/down   Non-skid slipper socks- Performed by helper: Don/doff left sock           AFO - Performed by  helper: Don/doff left AFO, Don/doff right AFO      Lower body assist Assist for lower body dressing: Touching or steadying assistance (Pt > 75%)      Toileting Toileting   Toileting steps completed by patient: Adjust clothing prior to toileting, Performs perineal hygiene, Adjust clothing after toileting Toileting steps completed by helper: Performs perineal hygiene, Adjust clothing after toileting Toileting Assistive Devices: Grab bar or rail   Toileting assist Assist level: Set up/obtain supplies   Transfers Chair/bed transfer   Chair/bed transfer method: Ambulatory Chair/bed transfer assist level: Supervision or verbal cues Chair/bed transfer assistive device: Armrests, Other (knee scooter)     Locomotion Ambulation     Max distance: 300 Assist level: Supervision or verbal cues   Wheelchair   Type: Manual Max wheelchair distance: 150 Assist Level: No help, No cues, assistive device, takes more than reasonable amount of time  Cognition Comprehension Comprehension assist level: Understands basic 75 - 89% of the time/ requires cueing 10 - 24% of the time  Expression Expression assist level: Expresses basic 90% of the time/requires cueing < 10% of the time.  Social Interaction Social Interaction assist level: Interacts appropriately 90% of the time - Needs monitoring or encouragement for participation or interaction.  Problem Solving Problem solving assist level: Solves basic 75 - 89% of the time/requires cueing 10 - 24% of the time  Memory Memory assist level: Recognizes or recalls 75 - 89% of the time/requires cueing 10 - 24% of the time    Medical Problem List and Plan: 1.  Limitations in self care, mobility deficits secondary to B/l embolic MCA infarcts.  CIR PT, OT SLP, cont CIR, planning d/c later in week 2.  DVT Prophylaxis/Anticoagulation: Pharmaceutical: Lovenox, resumed , on ASA for CVA prophyllaxis  , normal LE dopplers                3. Pain Management: tylenol prn.  4. Mood: LCSW to follow for evaluation and support.  Mild adjustment issues but denies anxiety/depression. Monitor for now.   5. Neuropsych: This patient is capable of making decisions on his own behalf. 6. Skin/Wound Care: routine pressure relief measures. Cleanse incision with normal saline and apply Gentamicin cream every other day. 7. Fluids/Electrolytes/Nutrition: Monitor I/O. Educate patient on heart healthy diet. 8. Right forefoot  reconstruction: NWB DARCO sandal  DPM note reviewed from 08/19/16, cont NWB and dressing change qod, recheck 4 wks, pt states that his f/u appt was 1/31, 9. H/o Gout: Stable. Monitor for now . 10. Insomnia: managed by trazodone.  11.  New diagnosis Diabetes: Hgb A1c- 7.0. Consulted RD for education. Monitor BS ac/hs  Started low dose metformin. CBG only elevated in pm, increase metofrmin  Use SSI for elevated BS.    CBG (last 3)   Recent Labs  09/13/16 1655 09/13/16 2056 09/14/16 0644  GLUCAP 193* 186* 143*     12. Hypoalbuminemia  Supplement initiated 1/25  13. Diabetic neuropathy gabapentin 300mg  Qhs, some improvement vs 100mg  TID, will increase to 400mg  qhs LOS (Days) 12 A FACE TO FACE EVALUATION WAS PERFORMED  Erick ColaceKIRSTEINS,ANDREW E 09/14/2016 6:47 AM

## 2016-09-14 NOTE — Discharge Summary (Signed)
Physician Discharge Summary  Patient ID: Aaron Moon MRN: [id number] DOB/AGE: 1945/09/17 71 y.o.  Admit date: 09/02/2016 Discharge date: 09/17/2016  Discharge Diagnoses:  Principal Problem:   Acute ischemic left MCA stroke [facility]) Active Problems:   Hallux abductovalgus with bunions, right   Morbid obesity ([facility])   Diabetes mellitus ([place])   Primary insomnia   Status post placement of implantable loop recorder   Acute blood loss anemia   Hypoalbuminemia due to protein-calorie malnutrition ([facility])   Labile blood pressure   Discharged Condition: stable   Significant Diagnostic Studies: N/A   Labs:  Basic Metabolic Panel: BMP Latest Ref Rng & Units 09/16/2016 09/09/2016 09/03/2016  Glucose 65 - 99 mg/dL 128(H) 125(H) 175(H)  BUN 6 - 20 mg/dL _0 Creatinine 0.61 - 1.24 mg/dL 1.19 1.05 1.19  Sodium 135 - 145 mmol/L 139 136 136  Potassium 3.5 - 5.1 mmol/L 4.2 4.6 4.0  Chloride 101 - 111 mmol/L 104 104 102  CO2 22 - 32 mmol/L 24 24 21(L)  Calcium 8.9 - 10.3 mg/dL 9.2 9.0 8.5(L)    CBC: CBC Latest Ref Rng & Units 09/16/2016 09/09/2016 09/03/2016  WBC 4.0 - 10.5 K/uL 8.5 9.7 9.4  Hemoglobin 13.0 - 17.0 g/dL 12.3(L) 13.0 12.0(L)  Hematocrit 39.0 - 52.0 % 38.4(L) 40.3 37.4(L)  Platelets 150 - 400 K/uL 271 261 233    CBG:  Recent Labs Lab 09/16/16 0646 09/16/16 1140 09/16/16 1638 09/16/16 2033 09/17/16 0631  GLUCAP 117* 130* 161* 158* 131*     Brief HPI:   Aaron Moon a 71 y.o.R-handed male with history of gout, obesity, right foot surgery 07/2016--PWB who was admitted on 08/30/16 with sudden onset of right arm weakness and tPA administered at [facility] prior to transfer to [facility]. On admission, patient noted to have  RUE> RLE weakness as well as facial weakness. CTA head/neck with moderate proximal L-VA stenosis and mild intracranial atherosclerosis. MRI brain reviewed, showing patchy LEFT MCA infarct with petechial hemorrhage and small RIGHT nonhemorrhagic MCA territory infarct  concerning for central embolic etiology.   TEE done 1/24 revealing small PFO, EF 60-65% and trivial MR.  BLE dopplers negative and Loop recorder placed 1/24.  Therapy evaluations done revealing impaired selective attention, limited awareness of deficits and inability to maintain NWB on RLE. CIR recommended for follow up therapy.    Hospital Course: Brand Siever was admitted to rehab 09/02/2016 for inpatient therapies to consist of PT, ST and OT at least three hours five days a week. Past admission physiatrist, therapy team and rehab RN have worked together to provide customized collaborative inpatient rehab. He was found to have elevated Hgb A1c- 7 with new diagnosis of DM and was started on metformin. BS have been monitored on ac/hs basis and he as educated on CM diet by RD.  Po intake has been good and he is continent of bowel and bladder. Blood pressures have been monitored on bid basis and have been controlled without medications. Right foot pin sites are clean, dry and healing well.  Weight bearing status was clarified by podiatry and he was allowed to WB on right heel with Darco shoe. Reactive leucocytosis has resolve and renal status has been stable. He has made good progress but continues to be limited by WB restrictions. He is currently at supervision level. LCSW has been following for support and has [facility] for [facility] on [facility] assistance. Patient has elected on discharge to home for trial and will contact SW if he  decides on ned for SNF. He will continue to receive follow up [place] [place], [place], [place] and [facility] after discharge.    Rehab course: During patient's stay in rehab weekly team conferences were held to monitor patient's progress, set goals and discuss barriers to discharge. At admission, patient required mod assist with mobility and max to total assist with basic self care tasks. He displayed mild to moderate cognitive impairments affecting comprehension of complex  information and complex verbal expression.  He has had improvement in activity tolerance, balance, postural control, as well as ability to compensate for deficits. He is has had improvement in functional use RUE and RLE as well as improved awareness. He is able to complete ADL tasks with supervision and intermittent cues for safety and sequencing of higher level tasks. He requires min assist for shower transfers and has been advised not to perform this will cleared to WBAT on RLE.  He is modified independent for bed mobility and requires supervision with tranfers and to ambulate 300' with knee scooter.  He requires supervisory cues for functional problem solving with basic tasks and selective attention and min assist for complex problem solving and recall of new information. Patient education was completed regarding safety concerns as his friends can only provide intermittent supervision.    Disposition:  Home   Diet: Diabetic diet  Special Instructions: 1. Check blood sugars twice a day and record. 2. Maintain weight bearing as advised by podiatry.   Discharge Instructions    Ambulatory referral to Physical Medicine Rehab    Complete by:  As directed    1-2 week transitional care     Allergies as of 09/17/2016   No Known Allergies     Medication List    STOP taking these medications   ALPRAZolam 0.5 MG tablet Commonly known as:  XANAX   meloxicam 15 MG tablet Commonly known as:  MOBIC   oxyCODONE-acetaminophen 5-325 MG tablet Commonly known as:  PERCOCET/ROXICET     TAKE these medications   albuterol 108 (90 Base) MCG/ACT inhaler Commonly known as:  PROVENTIL HFA;VENTOLIN HFA Inhale 2 puffs into the lungs every 6 (six) hours as needed for wheezing.   aspirin 325 MG EC tablet Take 1 tablet (325 mg total) by mouth daily. Start taking on:  09/18/2016   atorvastatin 20 MG tablet Commonly known as:  LIPITOR Take 1 tablet (20 mg total) by mouth daily at 6 PM.   blood glucose  meter kit and supplies Kit Dispense based on patient and insurance preference. Use up to four times daily as directed. (FOR ICD-9 250.00, 250.01).   cyclobenzaprine 5 MG tablet Commonly known as:  FLEXERIL Take 1 tablet (5 mg total) by mouth 3 (three) times daily as needed for muscle spasms.   gabapentin 400 MG capsule Commonly known as:  NEURONTIN Take 1 capsule (400 mg total) by mouth at bedtime.   gentamicin cream 0.1 % Commonly known as:  GARAMYCIN Apply 1 application topically 3 (three) times daily. What changed:  when to take this  reasons to take this   hydrocerin Crea Apply 1 application topically daily.   metFORMIN 500 MG tablet Commonly known as:  GLUCOPHAGE Take 1 tablet (500 mg total) by mouth daily with breakfast. Start taking on:  09/18/2016   MUSCLE RUB 10-15 % Crea Apply 1 application topically 2 (two) times daily as needed for muscle pain.   omeprazole 20 MG capsule Commonly known as:  PRILOSEC Take 20 mg  by mouth daily as needed (acid reflux).   polyethylene glycol packet Commonly known as:  MIRALAX / GLYCOLAX Take 17 g by mouth daily as needed for mild constipation.   traZODone 100 MG tablet Commonly known as:  DESYREL Take 1 tablet (100 mg total) by mouth at bedtime.      Follow-up Information    Charlett Blake, MD Follow up.   Specialty:  Physical Medicine and Rehabilitation Why:  office will call you with follow up appointment Contact information: [place] [address] [phone]        Antony Contras, MD. Call in 1 day(s).   Specialties:  Neurology, Radiology Why:  for follow up appointment in 4 weeks.  Contact information: [address] [place] [address] [phone]        Brent M Evans, DPM Follow up.   Specialty:  Podiatry Why:  keep appointment for pin removal tomorrow Contact information: [place] [address] [phone]        Charlsie Merles,  MD Follow up on 09/29/2016.   Specialty:  Internal Medicine Why:  Be there at 11:30 am for post hospital follow up.  Contact information: [place] [address] [phone]           Signed: Bary Leriche 09/17/2016, 4:51 PM

## 2016-09-14 NOTE — Progress Notes (Signed)
Speech Language Pathology Daily Session Note  Patient Details  Name: Aaron Moon MRN: [id number] Date of Birth: 1946-02-19  Today's Date: 09/14/2016 SLP Individual Time: [id number] SLP Individual Time Calculation (min): 30 min  Short Term Goals: Week 2: SLP Short Term Goal 1 (Week 2): Patient will utilize external memory aids to recall new, daily information with Min A verbal and visual cues.  SLP Short Term Goal 2 (Week 2): Patient will demonstrate functional problem solving with basic and familiar tasks with supervision verbal cues.  SLP Short Term Goal 3 (Week 2): Patient will self-monitor and correct errors during functional tasks with supervision verbal cues.  SLP Short Term Goal 4 (Week 2): Patient will follow complex tasks/directions with Mod I.   Skilled Therapeutic Interventions: Skilled treatment session focused on cognitive goals. SLP facilitated session by providing Min A verbal cues for use of memory compensatory strategies during a structured memory task. However, patient recalled all items named within task with supervision verbal cues after a 10 minute delay. SLP also facilitated session with a functional conversation that focused on anticipatory awareness in regards to d/c planning with Min A verbal cues. Patient left upright in wheelchair with all needs within reach.    Function:  Cognition Comprehension Comprehension assist level: Understands basic 75 - 89% of the time/ requires cueing 10 - 24% of the time  Expression   Expression assist level: Expresses basic 90% of the time/requires cueing < 10% of the time.  Social Interaction Social Interaction assist level: Interacts appropriately 90% of the time - Needs monitoring or encouragement for participation or interaction.  Problem Solving Problem solving assist level: Solves basic 75 - 89% of the time/requires cueing 10 - 24% of the time  Memory Memory assist level: Recognizes or recalls 75 - 89% of the time/requires cueing  10 - 24% of the time    Pain No/Denies Pain   Therapy/Group: Individual Therapy  Aaron Moon 09/14/2016, 3:18 PM

## 2016-09-14 NOTE — Progress Notes (Signed)
Physical Therapy Session Note  Patient Details  Name: Aaron Moon MRN: [id number] Date of Birth: 12-09-1945  Today's Date: 09/14/2016 PT Individual Time: 1300-1400 PT Individual Time Calculation (min): 60 min   Short Term Goals: Week 2:  PT Short Term Goal 1 (Week 2): =LTGs due to ELOS  Skilled Therapeutic Interventions/Progress Updates:    no c/o pain.  Session focus on safe transfers, w/c mobility, gait with knee scooter, LE strengthening, and endurance.    Pt propels w/c throughout unit mod I.  Transfers to/from car, to/from therapy mat, and to/from bed in ADL apartment with overall supervision and min>mod verbal cues for sequencing.  Pt continues to require more cues for transfers in unfamiliar set-ups indicated decreased ability to generalize.  PT provided pt with HEP for LAQ, hip flexion, and isometric hip adduction and reviewed with pt performing 2x15 reps with 3# ankle weights.  UEB x6 minutes (3 forward, 3 backward) for UE strengthening and cardiopulmonary endurance.  Pt asking throughout session, "how is this helping me?" and PT reviewed focus of session and PT goals/plan of care.    Pt returned to room at end of session and left upright in w/c with call bell in reach and needs met.   Therapy Documentation Precautions:  Precautions Precautions: Fall Required Braces or Orthoses: Other Brace/Splint Other Brace/Splint: CAM boot RLE, post op shoe on left Restrictions Weight Bearing Restrictions: Yes RLE Weight Bearing: Non weight bearing Other Position/Activity Restrictions: per DPM, pt able to bear weight through R heel for transfers only   See Function Navigator for Current Functional Status.   Therapy/Group: Individual Therapy  Earnest Conroy Penven-Crew 09/14/2016, 1:54 PM

## 2016-09-14 NOTE — Progress Notes (Signed)
Occupational Therapy Session Note  Patient Details  Name: Aaron Moon MRN: [id number] Date of Birth: 04-01-1946  Today's Date: 09/14/2016 OT Individual Time: 1100-1200 and 1417-1500 OT Individual Time Calculation (min): 60 min and 43 min   Short Term Goals: Week 2:  OT Short Term Goal 1 (Week 2): STG = LTGs due to remaining LOS  Skilled Therapeutic Interventions/Progress Updates:    1) Treatment session with focus on RUE NMR with gross and fine motor control tasks.  Pt received seated in w/c at doorway awaiting therapist.  Pt already dressed, reporting washing at sink with nurse tech assist.  Pt continues to c/o decreased legibility of handwriting.  Engaged in gross and fine motor control tasks with use of 1/4# weight on Lt wrist to improve feedback.  Utilized resistive clothespins with and without weighted cuff and stacking small blocks with focus on gross motor control to reach for specific ones to follow 2 step commands, with pt requiring min cues to recall directions.  Pt internally distracted and with decreased memory, impacting ability to complete tasks as well as demonstrate carryover with functional tasks.    2) Treatment session with focus on RUE NMR and safety awareness.  Pt received seated in w/c working on handwriting, with some improvements.  Engaged in RUE NMR with focus on grasp and digit strengthening with theraputty, issued HEP handout with visual and written instructions to increase recall.  Engaged in checkers activity with velcro set with focus on grasp strength and various pinch patterns while addressing ataxia.  Increased challenge by addressing functional challenges and recommendation to plan ahead, especially potentially challenging tasks to increase safety awareness and problem solving.  Therapy Documentation Precautions:  Precautions Precautions: Fall Required Braces or Orthoses: Other Brace/Splint Other Brace/Splint: CAM boot RLE, post op shoe on  left Restrictions Weight Bearing Restrictions: Yes RLE Weight Bearing: Non weight bearing Other Position/Activity Restrictions: per DPM, pt able to bear weight through R heel for transfers only Pain: Pain Assessment Pain Assessment: 0-10 Pain Score: 0-No pain  See Function Navigator for Current Functional Status.   Therapy/Group: Individual Therapy  Rosalio LoudHOXIE, Shaquoia Miers 09/14/2016, 12:22 PM

## 2016-09-15 ENCOUNTER — Inpatient Hospital Stay (HOSPITAL_COMMUNITY): Payer: Federal, State, Local not specified - PPO | Admitting: Occupational Therapy

## 2016-09-15 ENCOUNTER — Inpatient Hospital Stay (HOSPITAL_COMMUNITY): Payer: Federal, State, Local not specified - PPO | Admitting: Speech Pathology

## 2016-09-15 ENCOUNTER — Inpatient Hospital Stay (HOSPITAL_COMMUNITY): Payer: Federal, State, Local not specified - PPO | Admitting: Physical Therapy

## 2016-09-15 ENCOUNTER — Ambulatory Visit: Payer: Federal, State, Local not specified - PPO

## 2016-09-15 LAB — GLUCOSE, CAPILLARY
GLUCOSE-CAPILLARY: 137 mg/dL — AB (ref 65–99)
Glucose-Capillary: 134 mg/dL — ABNORMAL HIGH (ref 65–99)
Glucose-Capillary: 115 mg/dL — ABNORMAL HIGH (ref 65–99)
Glucose-Capillary: 183 mg/dL — ABNORMAL HIGH (ref 65–99)

## 2016-09-15 NOTE — Progress Notes (Signed)
Physical Therapy Session Note  Patient Details  Name: Aaron Moon MRN: [id number] Date of Birth: 12/24/1945  Today's Date: 09/15/2016 PT Individual Time: 1000-1056 PT Individual Time Calculation (min): 56 min   Short Term Goals: Week 2:  PT Short Term Goal 1 (Week 2): =LTGs due to ELOS  Skilled Therapeutic Interventions/Progress Updates:    no c/o pain.  Session focus on car transfers, recall, LE/UE strengthening, and cardiopulmonary endurance.   PT instructed pt in BLE therex focus on pt recall from yesterday's session x15 reps of LAQ, hip flexion, and isometric hip adduction with min verbal cues for technique.  PT instructed pt in BUE therex for strengthening and cardiovascular endurance x15 reps of chest press, D2 diagonal, elbow extension, shoulder flexion, trunk rotation with isometric shoulder flexion hold, and bicep curls using level 3 theraband or 2-4# weight.  Rest breaks throughout as needed to recover.    Transfers throughout session with supervision.  PT instructed pt in car transfer at simulated SUV height with mod multimodal cues for safe transfer.  W/C propulsion throughout unit, max distance 150' with BUEs for mobility and cardiovascular endurance.    Pt frequently laughing throughout session and internally distracted, requiring cues to attend to therapy tasks.  Pt unable to recall events from AM OT session (830-930) until min cues provided.  Pt continues to require frequent education on deficits and impact on return to daily life once d/c'd.  Returned to room at end of session and positioned in w/c with call bell in reach and needs met.      Therapy Documentation Precautions:  Precautions Precautions: Fall Required Braces or Orthoses: Other Brace/Splint Other Brace/Splint: CAM boot RLE, post op shoe on left Restrictions Weight Bearing Restrictions: Yes RLE Weight Bearing: Non weight bearing Other Position/Activity Restrictions: per DPM, pt able to bear weight through R  heel for transfers only   See Function Navigator for Current Functional Status.   Therapy/Group: Individual Therapy  Aaron Moon 09/15/2016, 10:38 AM

## 2016-09-15 NOTE — Progress Notes (Signed)
Social Work Patient ID: Aaron Moon, male   DOB: [birth year], 71 y.o.   MRN: [id number]  Pt has changed his mind now and wants to pursue going to a NH, he does not feel he will do the exercises at home. [facility] and see what I need to do to pursue this option. [employer] has only authorized him until 2/8.

## 2016-09-15 NOTE — Progress Notes (Signed)
[facility]ICINE & REHABILITATION     PROGRESS NOTE  Subjective/Complaints:   Neuropathy pain in feet better with increased gabapentin Has some left foot swelling that has persisted post op Left foot reconstruction 05/28/16  ROS: Denies CP, SOB, N/V/D.  Objective: Vital Signs: Blood pressure 115/64, pulse 78, temperature 97.8 F (36.6 C), temperature source Oral, resp. rate 18, height 5\' 7"  (1.702 m), weight 117.4 kg (258 lb 14.9 oz), SpO2 97 %. No results found. No results for input(s): WBC, HGB, HCT, PLT in the last 72 hours. No results for input(s): NA, K, CL, GLUCOSE, BUN, CREATININE, CALCIUM in the last 72 hours.  Invalid input(s): CO CBG (last 3)   Recent Labs  09/14/16 1615 09/14/16 2058 09/15/16 0641  GLUCAP 140* 120* 134*    Wt Readings from Last 3 Encounters:  09/09/16 117.4 kg (258 lb 14.9 oz)  08/31/16 117.6 kg (259 lb 4.2 oz)  08/08/15 129.3 kg (285 lb)    Physical Exam:  BP 115/64 (BP Location: Left Arm)   Pulse 78   Temp 97.8 F (36.6 C) (Oral)   Resp 18   Ht 5\' 7"  (1.702 m)   Wt 117.4 kg (258 lb 14.9 oz)   SpO2 97%   BMI 40.55 kg/m  Constitutional: He appears well-developed. Obese  HENT: Normocephalic and atraumatic.  Eyes: EOM are normal. No discharge.  Cardiovascular: Normal rate and regular rhythm.  No JVD. Respiratory: Effort normal and breath sounds normal.  GI: Soft. Bowel sounds are normal. Obese  Musculoskeletal: He exhibits no edema or tenderness. Extensive surgical scarring dorsum L foot and medial to 1 st MTP, well healed, mild toe edema , no hypersensitivity or tenderness Neurological: He is alert and oriented. Sensation normal to LT on left foot Mild right facial weakness.  Speech clear.  Able to follow commands.  Motor: RUE: 4/5 proximal to distal, mod/severe dysmetria and ataxia RLE: 5/5 proximally, boot distally LUE: 4+/5 proximal to distal LLE: 5/5 proximal to distal  Skin: no evidence of erythema in inguinal  creases or scrotal area, left foot without lesions Psychiatric: normal affect today Ext mild edema left pedal no pain to palpation or with ROM  Assessment/Plan: 1. Functional deficits secondary to B/l embolic MCA infarcts which require 3+ hours per day of interdisciplinary therapy in a comprehensive inpatient rehab setting. Physiatrist is providing close team supervision and 24 hour management of active medical problems listed below. Physiatrist and rehab team continue to assess barriers to discharge/monitor patient progress toward functional and medical goals.  Function:  Bathing Bathing position   Position: Shower  Bathing parts Body parts bathed by patient: Right arm, Left arm, Chest, Abdomen, Front perineal area, Right upper leg, Left upper leg Body parts bathed by helper: Back, Left lower leg, Buttocks  Bathing assist Assist Level: Touching or steadying assistance(Pt > 75%)      Upper Body Dressing/Undressing Upper body dressing   What is the patient wearing?: Pull over shirt/dress     Pull over shirt/dress - Perfomed by patient: Thread/unthread right sleeve, Thread/unthread left sleeve, Put head through opening, Pull shirt over trunk          Upper body assist Assist Level: Set up   Set up : To obtain clothing/put away  Lower Body Dressing/Undressing Lower body dressing   What is the patient wearing?: Pants, AFO, Socks     Pants- Performed by patient: Thread/unthread right pants leg, Thread/unthread left pants leg, Pull pants up/down Pants- Performed by helper: Thread/unthread  right pants leg, Pull pants up/down   Non-skid slipper socks- Performed by helper: Don/doff left sock           AFO - Performed by helper: Don/doff left AFO, Don/doff right AFO      Lower body assist Assist for lower body dressing: Touching or steadying assistance (Pt > 75%)      Toileting Toileting   Toileting steps completed by patient: Adjust clothing prior to toileting, Performs  perineal hygiene, Adjust clothing after toileting Toileting steps completed by helper: Performs perineal hygiene, Adjust clothing after toileting Toileting Assistive Devices: Grab bar or rail  Toileting assist Assist level: Supervision or verbal cues   Transfers Chair/bed transfer   Chair/bed transfer method: Ambulatory Chair/bed transfer assist level: Supervision or verbal cues Chair/bed transfer assistive device: Armrests, Other (scooter)     Locomotion Ambulation     Max distance: 150 Assist level: Supervision or verbal cues   Wheelchair   Type: Manual Max wheelchair distance: 150 Assist Level: No help, No cues, assistive device, takes more than reasonable amount of time  Cognition Comprehension Comprehension assist level: Understands basic 75 - 89% of the time/ requires cueing 10 - 24% of the time  Expression Expression assist level: Expresses basic 90% of the time/requires cueing < 10% of the time.  Social Interaction Social Interaction assist level: Interacts appropriately 90% of the time - Needs monitoring or encouragement for participation or interaction.  Problem Solving Problem solving assist level: Solves basic 75 - 89% of the time/requires cueing 10 - 24% of the time  Memory Memory assist level: Recognizes or recalls 75 - 89% of the time/requires cueing 10 - 24% of the time    Medical Problem List and Plan: 1.  Limitations in self care, mobility deficits secondary to B/l embolic MCA infarcts.  CIR PT, OT SLP, cont CIR, planning d/c later in week 2.  DVT Prophylaxis/Anticoagulation: Pharmaceutical: Lovenox, resumed , on ASA for CVA prophyllaxis  , normal LE dopplers                3. Pain Management: tylenol prn.  4. Mood: LCSW to follow for evaluation and support.  Mild adjustment issues but denies anxiety/depression. Monitor for now.   5. Neuropsych: This patient is capable of making decisions on his own behalf. 6. Skin/Wound Care: routine pressure relief measures.  Cleanse incision with normal saline and apply Gentamicin cream every other day. 7. Fluids/Electrolytes/Nutrition: Monitor I/O. Educate patient on heart healthy diet. 8. Right forefoot reconstruction: NWB DARCO sandal  DPM note reviewed from 08/19/16, cont NWB and dressing change qod, recheck 4 wks, pt states that his f/u appt was 1/31, 9. H/o Gout: Stable. Monitor for now . 10. Insomnia: managed by trazodone.  11.  New diagnosis Diabetes: Hgb A1c- 7.0. Consulted RD for education. Monitor BS ac/hs  CBG controlled on metformin 500mg  qam  Use SSI for elevated BS.    CBG (last 3)   Recent Labs  09/14/16 1615 09/14/16 2058 09/15/16 0641  GLUCAP 140* 120* 134*     12. Hypoalbuminemia  Supplement initiated 1/25  13. Diabetic neuropathy gabapentin 400mg  Qhs, will be d/c medLOS (Days) 13 A FACE TO FACE EVALUATION WAS PERFORMED  Ilia Dimaano E 09/15/2016 6:46 AM

## 2016-09-15 NOTE — Progress Notes (Signed)
Social Work Patient ID: Aaron Moon, male   DOB: 1945-10-02, 71 y.o.   MRN: [id number]  Spoke with Jennifer-[facility] Social Worker to discuss services he may be eligible for at discharge from the hospital. She has placed him in the bath aide list and will work on getting him [employer], after talking with him. [place] and see if he wants to Pursue this route. He will have home health services resume via [facility] whom he had before after his foot surgery. Will work toward discharge Thursday.

## 2016-09-15 NOTE — Progress Notes (Signed)
Social Work Patient ID: Aaron Moon, male   DOB: [birth year], 71 y.o.   MRN: [id number]  Met with pt to discuss have faxed information to [place] in [place] the place that determines if NH beds available and If pt is appropriate for the one open. Pt aware when dealing with the [facility] it is a process and his insurance has only approved him until 2/8, so he states: " I may need to go home then once the [place] has decided Go from there." Pt does want to go to his foot MD appointment on Friday to get his pins removed. Will see what [facility] decides and when can reach them again. Have left three messages for today.

## 2016-09-15 NOTE — Progress Notes (Signed)
DOS 12.21.2017 Lapidus bunionectomy right. Cheilectomy right foot. Metatarsal head resections 2,3,4 right. Hammertoe repair 2,3,4,5 right Any other indicated procedure right foot.

## 2016-09-15 NOTE — Progress Notes (Signed)
Speech Language Pathology Daily Session Note  Patient Details  Name: Aaron Moon MRN: [id number] Date of Birth: 1945/10/21  Today's Date: 09/15/2016 SLP Individual Time: 1300-1330 SLP Individual Time Calculation (min): 30 min  Short Term Goals: Week 2: SLP Short Term Goal 1 (Week 2): Patient will utilize external memory aids to recall new, daily information with Min A verbal and visual cues.  SLP Short Term Goal 2 (Week 2): Patient will demonstrate functional problem solving with basic and familiar tasks with supervision verbal cues.  SLP Short Term Goal 3 (Week 2): Patient will self-monitor and correct errors during functional tasks with supervision verbal cues.  SLP Short Term Goal 4 (Week 2): Patient will follow complex tasks/directions with Mod I.   Skilled Therapeutic Interventions: Skilled treatment session focused on cognition goals. SLP facilitated session by providing Min A verbal cues for completion of basic functional problem solving task. Pt was returned to room, left upright in wheelchair with all needs within reach.      Function:    Cognition Comprehension Comprehension assist level: Understands basic 75 - 89% of the time/ requires cueing 10 - 24% of the time  Expression   Expression assist level: Expresses basic 90% of the time/requires cueing < 10% of the time.  Social Interaction Social Interaction assist level: Interacts appropriately 90% of the time - Needs monitoring or encouragement for participation or interaction.  Problem Solving Problem solving assist level: Solves basic 75 - 89% of the time/requires cueing 10 - 24% of the time  Memory Memory assist level: Recognizes or recalls 75 - 89% of the time/requires cueing 10 - 24% of the time    Pain    Therapy/Group: Individual Therapy  Mc Bloodworth B. Dreama Saaverton, M.S., CCC-SLP Speech-Language Pathologist  Maurizio Geno 09/15/2016, 1:34 PM

## 2016-09-15 NOTE — NC FL2 (Signed)
[place] MEDICAID FL2 LEVEL OF CARE SCREENING TOOL     IDENTIFICATION  Patient Name: Aaron Moon Birthdate: Jun 26, 1946 Sex: male Admission Date (Current Location): 09/02/2016  [facility] [place]   Facility and Address[facility]  [facility], [address], [place], [place] [address]      Provider Number: [id number]  Attending Physician Name and Address:  Erick ColaceAndrew E Kirsteins, MD  Relative Name and Phone Number:  Ailene ArdsMarie Harrison-sister [phone]    Current Level of Care: SNF Recommended Level of Care: Skilled Nursing Facility Prior Approval Number:    Date Approved/Denied:   PASRR Number: [id number]  Discharge Plan: SNF    Current Diagnoses: Patient Active Problem List   Diagnosis Date Noted  . Status post placement of implantable loop recorder   . Acute blood loss anemia   . Hypoalbuminemia due to protein-calorie malnutrition (HCC)   . Labile blood pressure   . Acute ischemic left MCA stroke (HCC) 09/02/2016  . Ataxia, post-stroke   . Adjustment disorder with mixed anxiety and depressed mood   . Status post right foot surgery   . History of gout   . Primary insomnia   . Acute ischemic stroke (HCC)   . Idiopathic gout   . Morbid obesity (HCC)   . Diastolic dysfunction   . Benign essential HTN   . Diabetes mellitus (HCC)   . Leukocytosis   . CVA (cerebral vascular accident) (HCC) 08/30/2016  . Stroke (cerebrum) (HCC) 08/30/2016  . 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 Hammertoe of right foot 07/20/2016  . Hallux valgus, acquired, bilateral 02/19/2014  . Gout 02/13/2013  . Hyperlipidemia 01/18/2012  . Obesity 01/18/2012    Orientation RESPIRATION BLADDER Height & Weight     Self, Time, Situation, Place  Normal Continent Weight: 258 lb 14.9 oz (117.4 kg) Height:  5\' 7"  (170.2 cm)  BEHAVIORAL SYMPTOMS/MOOD NEUROLOGICAL BOWEL NUTRITION STATUS      Continent Diet (Carb modified)  AMBULATORY  STATUS COMMUNICATION OF NEEDS Skin   Supervision Verbally Surgical wounds                       Personal Care Assistance Level of Assistance  Bathing, Dressing Bathing Assistance: Limited assistance Feeding assistance: Independent Dressing Assistance: Limited assistance     Functional Limitations Info  Speech     Speech Info: Impaired    SPECIAL CARE FACTORS FREQUENCY  PT (By licensed PT), OT (By licensed OT), Speech therapy     PT Frequency: 5x week OT Frequency: 5x week     Speech Therapy Frequency: 5 x week      Contractures Contractures Info: Not present    Additional Factors Info  Code Status, Allergies Code Status Info: Full Allergies Info: NKDA           Current Medications (09/15/2016):  This is the current hospital active medication list Current Facility-Administered Medications  Medication Dose Route Frequency Provider Last Rate Last Dose  . acetaminophen (TYLENOL) tablet 325-650 mg  325-650 mg Oral Q4H PRN Jacquelynn Creeamela S Love, PA-C   650 mg at 09/13/16 1718  . albuterol (PROVENTIL) (2.5 MG/3ML) 0.083% nebulizer solution 3 mL  3 mL Inhalation Q6H PRN Jacquelynn CreePamela S Love, PA-C      . alum & mag hydroxide-simeth (MAALOX/MYLANTA) 200-200-20 MG/5ML suspension 30 mL  30 mL Oral Q4H PRN Jacquelynn CreePamela S Love, PA-C      . aspirin EC tablet 325 mg  325 mg  Oral Daily Jacquelynn Cree, PA-C   325 mg at 09/15/16 0810  . atorvastatin (LIPITOR) tablet 20 mg  20 mg Oral q1800 Evlyn Kanner Love, PA-C   20 mg at 09/14/16 1706  . bisacodyl (DULCOLAX) suppository 10 mg  10 mg Rectal Daily PRN Jacquelynn Cree, PA-C      . cyclobenzaprine (FLEXERIL) tablet 5 mg  5 mg Oral TID PRN Erick Colace, MD   5 mg at 09/14/16 2103  . diphenhydrAMINE (BENADRYL) 12.5 MG/5ML elixir 12.5-25 mg  12.5-25 mg Oral Q6H PRN Evlyn Kanner Love, PA-C      . enoxaparin (LOVENOX) injection 40 mg  40 mg Subcutaneous Q24H Pamela S Love, PA-C   40 mg at 09/14/16 2103  . feeding supplement (PRO-STAT SUGAR FREE 64) liquid 30 mL   30 mL Oral BID Ankit Karis Juba, MD   30 mL at 09/15/16 0810  . gabapentin (NEURONTIN) capsule 400 mg  400 mg Oral QHS Erick Colace, MD   400 mg at 09/14/16 2103  . gentamicin cream (GARAMYCIN) 0.1 %   Topical Once per day on Tue Thu Sat Pamela S Love, PA-C      . guaiFENesin-dextromethorphan [facility] DM) 100-10 MG/5ML syrup 5-10 mL  5-10 mL Oral Q6H PRN Jacquelynn Cree, PA-C      . hydrocerin (EUCERIN) cream   Topical Once per day on Tue Thu Sat Pamela S Love, PA-C      . insulin aspart (novoLOG) injection 0-5 Units  0-5 Units Subcutaneous QHS Pamela S Love, PA-C      . insulin aspart (novoLOG) injection 0-9 Units  0-9 Units Subcutaneous TID WC Jacquelynn Cree, PA-C   1 Units at 09/15/16 4098  . metFORMIN (GLUCOPHAGE) tablet 500 mg  500 mg Oral Q breakfast Jacquelynn Cree, PA-C   500 mg at 09/15/16 0810  . MUSCLE RUB CREA   Topical BID PRN Erick Colace, MD      . pantoprazole (PROTONIX) EC tablet 40 mg  40 mg Oral QHS Evlyn Kanner Love, PA-C   40 mg at 09/14/16 2103  . polyethylene glycol (MIRALAX / GLYCOLAX) packet 17 g  17 g Oral Daily PRN Jacquelynn Cree, PA-C      . prochlorperazine (COMPAZINE) tablet 5-10 mg  5-10 mg Oral Q6H PRN Jacquelynn Cree, PA-C       Or  . prochlorperazine (COMPAZINE) injection 5-10 mg  5-10 mg Intramuscular Q6H PRN Jacquelynn Cree, PA-C       Or  . prochlorperazine (COMPAZINE) suppository 12.5 mg  12.5 mg Rectal Q6H PRN Jacquelynn Cree, PA-C      . sodium phosphate (FLEET) 7-19 GM/118ML enema 1 enema  1 enema Rectal Once PRN Jacquelynn Cree, PA-C      . traZODone (DESYREL) tablet 100 mg  100 mg Oral QHS Pamela S Love, PA-C   100 mg at 09/14/16 2103     Discharge Medications: Please see discharge summary for a list of discharge medications.  Relevant Imaging Results:  Relevant Lab Results:   Additional Information SSN: [id number] Pt [place] a veteran and connected to the [place] in [place], [place], [place]

## 2016-09-15 NOTE — Progress Notes (Signed)
Occupational Therapy Session Note  Patient Details  Name: Aaron SouDana Delmar MRN: [id number] Date of Birth: Aug 05, 1946  Today's Date: 09/15/2016 OT Individual Time: [phone] [id number] and 1351-1433 OT Individual Time Calculation (min): 57 min and 42 min   Short Term Goals: Week 2:  OT Short Term Goal 1 (Week 2): STG = LTGs due to remaining LOS  Skilled Therapeutic Interventions/Progress Updates:    1) Treatment session with focus on functional awareness and problem solving with familiar home management tasks.  Pt received upright in w/c, declined bathing and dressing at this time.  Discussed plan for bathing at shower level Weds in preparation for d/c Thurs, with pt agreeable.  Pt continues to report concerns with functional use of RUE with handwriting and computer skills.  Discussed use of computer with typing for making lists as handwriting continues to be poor.  Pt reports use of bill pay online as well as grocery shopping online, therefore minimal writing involved.  Utilized computer with focus on RUE motor control with use of mouse as well as typing.  Pt frequently hitting incorrect button on mouse, requiring increased time.  Educated on taking his time and double checking his work before clicking submit or pay.  Functional transfers in therapy gym with use of knee scooter with supervision only, no cues, with pt able to recall correct sequence safely.  Engaged in RUE NMR with focus on fine motor control with cognitive component to increase challenge with sequencing and planning, discussed functional carryover.  2) Treatment session with focus on functional mobility in home environment.  Pt completed mobility with knee walker in ADL apt, completing bed mobility, toilet transfers, and mobility in kitchen environment all at overall supervision level.  Pt is demonstrating increased consistency with safety with mobility, ability to recall and utilize strategies for safe transfers with nee walker.  Min cues for  sequencing in kitchen setting due to decreased awareness and problem solving.  Pt asking questions about theraputty exercises, with therapist demonstrating each from previously provided handout.  Encouraged pt to attempt exercises this evening and ask questions again if needed.  Therapy Documentation Precautions:  Precautions Precautions: Fall Required Braces or Orthoses: Other Brace/Splint Other Brace/Splint: CAM boot RLE, post op shoe on left Restrictions Weight Bearing Restrictions: Yes RLE Weight Bearing: Non weight bearing Other Position/Activity Restrictions: per DPM, pt able to bear weight through R heel for transfers only General:   Vital Signs:   Pain:   ADL:   Exercises:   Other Treatments:    See Function Navigator for Current Functional Status.   Therapy/Group: Individual Therapy  Rosalio LoudHOXIE, Teosha Casso 09/15/2016, 9:09 AM

## 2016-09-15 NOTE — Progress Notes (Signed)
Social Work Patient ID: Moishy Laday, male   DOB: 16-Apr-1946, 71 y.o.   MRN: [id number]  Met with pt along with Pam-PA to discuss concerns regarding discharge. He does not want to pursue going to a NH at this time. He wants to get home and see how it goes, he is seeing his foot MD Friday and having his pins removed and will hopefully be full weight bearing. Will do an FL2 form for the chart in case once gets home it Is too much for him. Will also make sure he has his Education officer, museum at the [place] contact numbers and names. Pt wants to get home to his cats, he misses. He feels it will work out at home. Have also spoken with his Sister in [place] and she is aware of his wishes. Will push through to discharge Thursday.

## 2016-09-16 ENCOUNTER — Inpatient Hospital Stay (HOSPITAL_COMMUNITY): Payer: Federal, State, Local not specified - PPO | Admitting: Physical Therapy

## 2016-09-16 ENCOUNTER — Inpatient Hospital Stay (HOSPITAL_COMMUNITY): Payer: Federal, State, Local not specified - PPO | Admitting: Occupational Therapy

## 2016-09-16 ENCOUNTER — Inpatient Hospital Stay (HOSPITAL_COMMUNITY): Payer: Federal, State, Local not specified - PPO | Admitting: Speech Pathology

## 2016-09-16 LAB — GLUCOSE, CAPILLARY
GLUCOSE-CAPILLARY: 117 mg/dL — AB (ref 65–99)
GLUCOSE-CAPILLARY: 130 mg/dL — AB (ref 65–99)
GLUCOSE-CAPILLARY: 161 mg/dL — AB (ref 65–99)
Glucose-Capillary: 158 mg/dL — ABNORMAL HIGH (ref 65–99)

## 2016-09-16 LAB — CBC
HEMATOCRIT: 38.4 % — AB (ref 39.0–52.0)
Hemoglobin: 12.3 g/dL — ABNORMAL LOW (ref 13.0–17.0)
MCH: 28.5 pg (ref 26.0–34.0)
MCHC: 32 g/dL (ref 30.0–36.0)
MCV: 89.1 fL (ref 78.0–100.0)
Platelets: 271 10*3/uL (ref 150–400)
RBC: 4.31 MIL/uL (ref 4.22–5.81)
RDW: 14 % (ref 11.5–15.5)
WBC: 8.5 10*3/uL (ref 4.0–10.5)

## 2016-09-16 LAB — BASIC METABOLIC PANEL
ANION GAP: 11 (ref 5–15)
BUN: 19 mg/dL (ref 6–20)
CALCIUM: 9.2 mg/dL (ref 8.9–10.3)
CO2: 24 mmol/L (ref 22–32)
Chloride: 104 mmol/L (ref 101–111)
Creatinine, Ser: 1.19 mg/dL (ref 0.61–1.24)
GFR calc Af Amer: >60 mL/min (ref >60)
Glucose, Bld: 128 mg/dL — ABNORMAL HIGH (ref 65–99)
POTASSIUM: 4.2 mmol/L (ref 3.5–5.1)
SODIUM: 139 mmol/L (ref 135–145)

## 2016-09-16 NOTE — Progress Notes (Signed)
Occupational Therapy Discharge Summary  Patient Details  Name: Aaron Moon MRN: [id number] Date of Birth: 1946-02-09  Patient has met 11 of 12 long term goals due to improved activity tolerance, improved balance, postural control, ability to compensate for deficits, functional use of  RIGHT upper extremity and improved awareness.  Patient to discharge at overall Supervision level.  Patient's care partner unavailable to provide the necessary physical and cognitive assistance at discharge.  Patient is discharging at overall Supervision level, as pt continues to require intermittent cues for increased safety awareness and sequencing of higher level tasks.  Recommend supervision with bathing and shower transfers, which pt reports he will not complete bathing at shower level until pins removed and full weight bearing allowed on Rt foot.  Pt verbalizes recommendation for supervision and reports he will have friends assisting intermittently.  Reasons goals not met: Pt requires min assist for shower transfers due to weight bearing status of RLE through heel only and decreased ability to step over shower threshold  Recommendation:  Patient will benefit from ongoing skilled OT services in home health setting to continue to advance functional skills in the area of BADL and Reduce care partner burden.  Equipment: No equipment provided  Reasons for discharge: treatment goals met and discharge from hospital  Patient/family agrees with progress made and goals achieved: Yes  OT Discharge Precautions/Restrictions  Precautions Precautions: Fall Precaution Comments: decreased safety awareness Required Braces or Orthoses: Other Brace/Splint Other Brace/Splint: CAM boot RLE, post op shoe on left Restrictions Weight Bearing Restrictions: Yes RLE Weight Bearing: Weight bearing as tolerated Other Position/Activity Restrictions: WBAT through R heel for transfers General   Vital Signs Therapy Vitals Temp:  97.7 F (36.5 C) Temp Source: Oral Pulse Rate: 89 Resp: 18 BP: 130/81 Patient Position (if appropriate): Sitting Oxygen Therapy SpO2: 99 % O2 Device: Not Delivered Pain Pain Assessment Pain Assessment: No/denies pain ADL  See Function Navigator Vision/Perception  Vision- History Baseline Vision/History: Wears glasses Wears Glasses: Reading only Patient Visual Report: No change from baseline Vision- Assessment Vision Assessment?: Yes Eye Alignment: Within Functional Limits Ocular Range of Motion: Within Functional Limits Alignment/Gaze Preference: Within Defined Limits Tracking/Visual Pursuits: Decreased smoothness of horizontal tracking;Unable to hold eye position out of midline Saccades: Additional head turns occurred during testing  Cognition Overall Cognitive Status: Impaired/Different from baseline Arousal/Alertness: Awake/alert Orientation Level: Oriented X4 Attention: Selective Selective Attention: Appears intact Memory: Impaired Memory Impairment: Decreased recall of new information;Retrieval deficit Safety/Judgment: Impaired Sensation Sensation Light Touch: Appears Intact Proprioception: Appears Intact Coordination Gross Motor Movements are Fluid and Coordinated: Yes Fine Motor Movements are Fluid and Coordinated: No Coordination and Movement Description: RUE finger opposition accurate but decreased speed Finger Nose Finger Test: ataxia on R, WNL on L Heel Shin Test: limited 2/2 body habitus Motor  Motor Motor: Within Functional Limits Motor - Discharge Observations: weakness improving Mobility  Bed Mobility Bed Mobility: Supine to Sit;Sit to Supine Supine to Sit: 6: Modified independent (Device/Increase time) Sit to Supine: 6: Modified independent (Device/Increase time) Transfers Sit to Stand: 5: Supervision Stand to Sit: 5: Supervision  Trunk/Postural Assessment  Cervical Assessment Cervical Assessment: Within Functional Limits Thoracic  Assessment Thoracic Assessment: Within Functional Limits Lumbar Assessment Lumbar Assessment: Within Functional Limits Postural Control Postural Control: Within Functional Limits  Balance Balance Balance Assessed: Yes Static Standing Balance Static Standing - Balance Support: During functional activity;Right upper extremity supported;Left upper extremity supported;Bilateral upper extremity supported Static Standing - Level of Assistance: 6: Modified independent (Device/Increase time) Dynamic Standing  Balance Dynamic Standing - Balance Support: During functional activity;Right upper extremity supported;Left upper extremity supported;Bilateral upper extremity supported Dynamic Standing - Level of Assistance: 5: Stand by assistance Extremity/Trunk Assessment RUE Assessment RUE Assessment: Within Functional Limits RUE AROM (degrees) Overall AROM Right Upper Extremity: Within functional limits for tasks performed RUE Overall AROM Comments: improved functional mobility with ROM WNL RUE PROM (degrees) Overall PROM Right Upper Extremity: Within functional limits for tasks performed RUE Strength RUE Overall Strength: Deficits RUE Overall Strength Comments: strength grossly 4/5 proximal to distal, however loose gross grasp LUE Assessment LUE Assessment: Within Functional Limits   See Function Navigator for Current Functional Status.  Simonne Come 09/16/2016, 3:55 PM

## 2016-09-16 NOTE — Progress Notes (Signed)
Speech Language Pathology Daily Session Note  Patient Details  Name: Aaron Moon MRN: [id number] Date of Birth: Aug 23, 1945  Today's Date: 09/16/2016 SLP Individual Time: 0830-0900 SLP Individual Time Calculation (min): 30 min  Short Term Goals: Week 2: SLP Short Term Goal 1 (Week 2): Patient will utilize external memory aids to recall new, daily information with Min A verbal and visual cues.  SLP Short Term Goal 2 (Week 2): Patient will demonstrate functional problem solving with basic and familiar tasks with supervision verbal cues.  SLP Short Term Goal 3 (Week 2): Patient will self-monitor and correct errors during functional tasks with supervision verbal cues.  SLP Short Term Goal 4 (Week 2): Patient will follow complex tasks/directions with Mod I.   Skilled Therapeutic Interventions: Skilled treatment session focused on cognitive goals. SLP facilitated session by providing supervision verbal cues for recall of current and relevant information and anticipatory awareness in regards to d/c planning. Patient verbalized ways to incorporate memory compensatory strategies at home with supervision verbal cues. Patient left upright in wheelchair with all needs within reach. Continue with current plan of care.      Function:  Cognition Comprehension Comprehension assist level: Understands complex 90% of the time/cues 10% of the time  Expression   Expression assist level: Expresses complex 90% of the time/cues < 10% of the time  Social Interaction Social Interaction assist level: Interacts appropriately 90% of the time - Needs monitoring or encouragement for participation or interaction.  Problem Solving Problem solving assist level: Solves complex 90% of the time/cues < 10% of the time  Memory Memory assist level: Recognizes or recalls 75 - 89% of the time/requires cueing 10 - 24% of the time    Pain Pain Assessment Pain Assessment: No/denies pain  Therapy/Group: Individual  Therapy  Karalynn Cottone 09/16/2016, 3:03 PM

## 2016-09-16 NOTE — Progress Notes (Signed)
Occupational Therapy Session Note  Patient Details  Name: Aaron SouDana Moon MRN: [id number] Date of Birth: Jun 20, 1946  Today's Date: 09/16/2016 OT Individual Time: [id number] and [id number] OT Individual Time Calculation (min): 73 min and 23 min   Short Term Goals: Week 2:  OT Short Term Goal 1 (Week 2): STG = LTGs due to remaining LOS  Skilled Therapeutic Interventions/Progress Updates:    1) Treatment session with focus on functional mobility and awareness with ADL retraining. Required min assist for transfer into room shower due to instability and decreased awareness and problem solving.  Pt completed bathing and dressing at overall distant supervision with setup for supplies and encouragement to utilize lateral leans and long handled sponge to wash buttocks.  Discussed setup of home bathroom and walk-in shower to increase problem solving and awareness.  Addressed transfers in ADL apt with simulated walk-in shower with grab bars to simulate home setup.  Pt able to complete "dry run" transfers in apt with use of knee walker and supervision, requiring increased time and auditory processing to increase safety with transfer.  Therapist recommended supervision for bathing and shower transfers due to decreased awareness and problem solving.  Pt is on list for bath aide through [facility].  Pt reports he will complete sponge bathing until pins removed from foot.  Plan to have HHOT address and further problem solve shower transfers.  2) Treatment session with focus on RUE NMR.  Reiterated functional exercises as well as HEP to increase strength and coordination to continue to improve upon RUE ataxia.  Engaged in theraputty exercises with pt requiring min cues for proper technique with exercises.    Therapy Documentation Precautions:  Precautions Precautions: Fall Required Braces or Orthoses: Other Brace/Splint Other Brace/Splint: CAM boot RLE, post op shoe on left Restrictions Weight Bearing Restrictions:  Yes RLE Weight Bearing: Non weight bearing Other Position/Activity Restrictions: per DPM, pt able to bear weight through R heel for transfers only Pain: Pain Assessment Pain Assessment: No/denies pain  See Function Navigator for Current Functional Status.   Therapy/Group: Individual Therapy  Rosalio LoudHOXIE, Eviana Sibilia 09/16/2016, 11:22 AM

## 2016-09-16 NOTE — Progress Notes (Addendum)
Social Work Patient ID: Aaron Moon, male   DOB: 1946-05-06, 71 y.o.   MRN: [id number]  Met with pt who reports his nephew is a patient now in the hospital. He suffered a light stroke. He has all of pt's cards and keys. Pt to contact his sister and get her to get his things he needs to go home with. He reports he plans to go home tomorrow and aware the [facility] has not contacted this worker back regarding going to a NH form hospital. He wants to go to his MD appointment on Friday to get his pins removed from his foot. Can continue to work on [place] services even after pt is discharged. Therapy team feels he will be safe at home with intermittent assist. He is aware not to shower on his own and to wait for the home health aide. Will continue to try to get the [place] and find out if going to a [facility] is an option for [facility]. [facility], pt aware of this.

## 2016-09-16 NOTE — Progress Notes (Signed)
Physical Therapy Discharge Summary  Patient Details  Name: Aaron Moon MRN: [id number] Date of Birth: Jul 16, 1946  Today's Date: 09/16/2016 PT Individual Time: 4098-1191 PT Individual Time Calculation (min): 73 min    Patient has met 10 of 10 long term goals due to improved activity tolerance, improved balance, improved postural control, increased strength, ability to compensate for deficits, improved attention, improved awareness and improved coordination.  Patient to discharge at household ambulatory, community w/c level Supervision.   Patient's care partner unavailable to provide the necessary cognitive assistance at discharge.  Pt will have limited assistance at home due to limited support system.    Recommendation:  Patient will benefit from ongoing skilled PT services in home health setting to continue to advance safe functional mobility, address ongoing impairments in balance, safety awareness, and attention, and minimize fall risk.  Equipment: No equipment provided  Reasons for discharge: treatment goals met  Patient/family agrees with progress made and goals achieved: Yes   Skilled therapeutic intervention: No c/o pain.  Session focus on d/c assessment, functional transfers, gait with knee scooter, w/c propulsion, HEP, and d/c planning.   Pt transfers sit<>stand throughout session mod I, but requires supervision for transfers between surfaces for set up and when turning.  Gait with knee scooter throughout unit, max distance 300, with supervision and min cues when passing over thresholds and on uneven surfaces.  Pt negotiated up/down ramp with knee scooter and close supervision.  Car transfer with supervision.  Pt reviewed HEP with min cues from PT for technique x15 reps for LAQ, hip flexion, and hip adduction.  W/C propulsion throughout unit, max distance 150', mod I.  Pt returned to room at end of session and positioned in w/c with call bell in reach and needs met.   PT  Discharge Precautions/Restrictions Precautions Precautions: Fall Precaution Comments: decreased safety awareness Required Braces or Orthoses: Other Brace/Splint Other Brace/Splint: CAM boot RLE, post op shoe on left Restrictions Weight Bearing Restrictions: Yes RLE Weight Bearing: Weight bearing as tolerated Other Position/Activity Restrictions: WBAT through R heel for transfers Vital Signs Therapy Vitals Temp: 97.7 F (36.5 C) Temp Source: Oral Pulse Rate: 89 Resp: 18 BP: 130/81 Patient Position (if appropriate): Sitting Oxygen Therapy SpO2: 99 % O2 Device: Not Delivered Pain Pain Assessment Pain Assessment: No/denies pain Vision/Perception     Cognition Overall Cognitive Status: Impaired/Different from baseline Arousal/Alertness: Awake/alert Orientation Level: Oriented X4 Attention: Selective Selective Attention: Appears intact Memory: Impaired Memory Impairment: Decreased recall of new information;Retrieval deficit Safety/Judgment: Impaired Sensation Sensation Light Touch: Appears Intact Proprioception: Appears Intact Coordination Gross Motor Movements are Fluid and Coordinated: Yes Fine Motor Movements are Fluid and Coordinated: No Coordination and Movement Description: RUE finger opposition accurate but decreased speed Finger Nose Finger Test: ataxia on R, WNL on L Heel Shin Test: limited 2/2 body habitus Motor  Motor Motor: Within Functional Limits Motor - Discharge Observations: weakness improving  Mobility Bed Mobility Bed Mobility: Supine to Sit;Sit to Supine Supine to Sit: 6: Modified independent (Device/Increase time) Sit to Supine: 6: Modified independent (Device/Increase time) Transfers Transfers: Yes Sit to Stand: 5: Supervision Stand to Sit: 5: Supervision Squat Pivot Transfers: 5: Supervision Locomotion  Ambulation Ambulation: Yes Ambulation/Gait Assistance: 5: Supervision Ambulation Distance (Feet): 300 Feet Assistive device: Other  (Comment) (knee scooter) Stairs / Additional Locomotion Stairs: No Wheelchair Mobility Wheelchair Mobility: Yes Wheelchair Assistance: 6: Modified independent (Device/Increase time) Environmental health practitioner: Both upper extremities Wheelchair Parts Management: Supervision/cueing Distance: 150  Trunk/Postural Assessment  Cervical Assessment Cervical Assessment:  Within Functional Limits Thoracic Assessment Thoracic Assessment: Within Functional Limits Lumbar Assessment Lumbar Assessment: Within Functional Limits Postural Control Postural Control: Within Functional Limits  Balance Balance Balance Assessed: Yes Static Standing Balance Static Standing - Balance Support: During functional activity;Right upper extremity supported;Left upper extremity supported;Bilateral upper extremity supported Static Standing - Level of Assistance: 6: Modified independent (Device/Increase time) Dynamic Standing Balance Dynamic Standing - Balance Support: During functional activity;Right upper extremity supported;Left upper extremity supported;Bilateral upper extremity supported Dynamic Standing - Level of Assistance: 5: Stand by assistance Extremity Assessment      RLE Assessment RLE Assessment: Within Functional Limits RLE Strength Right Hip Flexion: 4/5 Right Knee Flexion: 4+/5 Right Knee Extension: 4+/5 Right Ankle Dorsiflexion: 3-/5 Right Ankle Plantar Flexion: 3-/5 LLE Assessment LLE Assessment: Within Functional Limits LLE Strength Left Hip Flexion: 4+/5 Left Knee Flexion: 5/5 Left Knee Extension: 5/5 Left Ankle Dorsiflexion: 5/5 Left Ankle Plantar Flexion: 4/5   See Function Navigator for Current Functional Status.  Macall Mccroskey E Penven-Crew 09/16/2016, 3:30 PM

## 2016-09-16 NOTE — Patient Care Conference (Signed)
Inpatient RehabilitationTeam Conference and Plan of Care Update Date: 09/16/2016   Time: 11:00 AM    Patient Name: Aaron Moon      Medical Record Number: [id number]  Date of Birth: 02-09-1946 Sex: Male         Room/Bed: 4M02C/4M02C-01 Payor Info: Payor: BLUE CROSS BLUE [employer]IELD / Plan: BCBS/[employer] PPO / Product Type: *No Product type* /    Admitting Diagnosis: B CVA  Admit Date/Time:  09/02/2016  5:14 PM Admission Comments: No comment available   Primary Diagnosis:  Acute ischemic left MCA stroke (HCC) Principal Problem: Acute ischemic left MCA stroke [facility](HCC)  Patient Active Problem List   Diagnosis Date Noted  . Status post placement of implantable loop recorder   . Acute blood loss anemia   . Hypoalbuminemia due to protein-calorie malnutrition (HCC)   . Labile blood pressure   . Acute ischemic left MCA stroke (HCC) 09/02/2016  . Ataxia, post-stroke   . Adjustment disorder with mixed anxiety and depressed mood   . Status post right foot surgery   . History of gout   . Primary insomnia   . Acute ischemic stroke (HCC)   . Idiopathic gout   . Morbid obesity (HCC)   . Diastolic dysfunction   . Benign essential HTN   . Diabetes mellitus (HCC)   . Leukocytosis   . CVA (cerebral vascular accident) (HCC) 08/30/2016  . Stroke (cerebrum) (HCC) 08/30/2016  . 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 Hammertoe of right foot 07/20/2016  . Hallux valgus, acquired, bilateral 02/19/2014  . Gout 02/13/2013  . Hyperlipidemia 01/18/2012  . Obesity 01/18/2012    Expected Discharge Date: Expected Discharge Date: 09/17/16  Team Members Present: Physician leading conference: Dr. Claudette LawsAndrew Kirsteins Social Worker Present: Dossie DerBecky Ziaire Hagos, LCSW Nurse Present: Kennyth ArnoldStacey Jennings, RN PT Present: Teodoro Kilaitlin Penven-Crew, PT OT Present: Rosalio LoudSarah Hoxie, OT SLP Present: Feliberto Gottronourtney Payne, SLP PPS Coordinator present : Tora DuckMarie Noel, RN, CRRN     Current Status/Progress Goal Weekly Team Focus   Medical   No foot pain with PT, night time pain improved with gabapentin  Avoid wt bearing RLE, pain management  D/C planning   Bowel/Bladder   continent of B/B. LBM 09/14/16  pt to maintain current level of continence  monitor for changes in continence level   Swallow/Nutrition/ Hydration             ADL's   supervision with transfers, supervision/cues for LB dressing, min assist bathing at shower level - supervision at sink.  Is demonstrating improved problem solving and safety awareness  Supervision overall  ADL retraining, functional transfers/mobility wiht knee walker, standing balance, RUE NMR   Mobility   supervision   supervision  d/c planning, grad day Wednesday, d/c Thursday   Communication             Safety/Cognition/ Behavioral Observations  Supervision-Min A   Supervision  recall, awareness, problem solving    Pain   tylenol 650mg ; PRN flexeril for R foot  <3  monitor for effectiveness of PRN medications   Skin   ORIF R ankle/IM nailing to R tibia w/ pins to R toes  pt will be free of additional skin breakdown  assess for changes in skin integrity; dressing changes with topical antibiotic on Tues/Thurs/Sat      *See Care Plan and progress notes for long and short-term goals.  Barriers to Discharge: nephew hospitalized    Possible Resolutions to Barriers:  will d/c in am, [facility], OT  Discharge Planning/Teaching Needs:  Pt would like to go to a [place] NH but if not approved before 2/8 will need to go home and get placed from there since BCBS has only apprioved him until 2/8      Team Discussion:  Meeting goals of supervision to mod/i level. Feels after MD follow up appointment on Friday pins will be removed and he will be FWB. Problem solving and sequencing better and can do a pill box for meds. Team feels can be safe home alone with intermittent assist. Will need assist bathing for safety. Nephew now a patient in the hospital. Pt plans to go home and manage,  aware [facility] contacted and not returned calls yet.  Revisions to Treatment Plan:  DC 2/8   Continued Need for Acute Rehabilitation Level of Care: The patient requires daily medical management by a physician with specialized training in physical medicine and rehabilitation for the following conditions: Daily direction of a multidisciplinary physical rehabilitation program to ensure safe treatment while eliciting the highest outcome that is of practical value to the patient.: Yes Daily medical management of patient stability for increased activity during participation in an intensive rehabilitation regime.: Yes Daily analysis of laboratory values and/or radiology reports with any subsequent need for medication adjustment of medical intervention for : Neurological problems;Diabetes problems  Bintou Lafata, Lemar Livings 09/16/2016, 12:48 PM

## 2016-09-16 NOTE — Progress Notes (Signed)
[facility] PHYSICAL MEDICINE & REHABILITATION     PROGRESS NOTE  Subjective/Complaints:   No further pain in left foot.  Concerned about his nephew who is in ED ROS: Denies CP, SOB, N/V/D.  Objective: Vital Signs: Blood pressure 108/75, pulse 85, temperature 98.1 F (36.7 C), temperature source Oral, resp. rate 17, height '5\' 7"'  (1.702 m), weight 117.4 kg (258 lb 14.9 oz), SpO2 98 %. No results found.  Recent Labs  09/16/16 0513  WBC 8.5  HGB 12.3*  HCT 38.4*  PLT 271    Recent Labs  09/16/16 0513  NA 139  K 4.2  CL 104  GLUCOSE 128*  BUN 19  CREATININE 1.19  CALCIUM 9.2   CBG (last 3)   Recent Labs  09/15/16 1627 09/15/16 2032 09/16/16 0646  GLUCAP 183* 137* 117*    Wt Readings from Last 3 Encounters:  09/09/16 117.4 kg (258 lb 14.9 oz)  08/31/16 117.6 kg (259 lb 4.2 oz)  08/08/15 129.3 kg (285 lb)    Physical Exam:  BP 108/75 (BP Location: Left Arm)   Pulse 85   Temp 98.1 F (36.7 C) (Oral)   Resp 17   Ht '5\' 7"'  (1.702 m)   Wt 117.4 kg (258 lb 14.9 oz)   SpO2 98%   BMI 40.55 kg/m  Constitutional: He appears well-developed. Obese  HENT: Normocephalic and atraumatic.  Eyes: EOM are normal. No discharge.  Cardiovascular: Normal rate and regular rhythm.  No JVD. Respiratory: Effort normal and breath sounds normal.  GI: Soft. Bowel sounds are normal. Obese  Musculoskeletal: He exhibits no edema or tenderness. Extensive surgical scarring dorsum L foot and medial to 1 st MTP, well healed, mild toe edema , no hypersensitivity or tenderness Neurological: He is alert and oriented. Sensation normal to LT on left foot Mild right facial weakness.  Speech clear.  Able to follow commands.  Motor: RUE: 4/5 proximal to distal, mod/severe dysmetria and ataxia RLE: 5/5 proximally, boot distally LUE: 4+/5 proximal to distal LLE: 5/5 proximal to distal  Skin: no evidence of erythema in inguinal creases or scrotal area, left foot without lesions Psychiatric:  normal affect today Ext mild edema left pedal no pain to palpation or with ROM  Assessment/Plan: 1. Functional deficits secondary to B/l embolic MCA infarcts which require 3+ hours per day of interdisciplinary therapy in a comprehensive inpatient rehab setting. Physiatrist is providing close team supervision and 24 hour management of active medical problems listed below. Physiatrist and rehab team continue to assess barriers to discharge/monitor patient progress toward functional and medical goals.  Function:  Bathing Bathing position   Position: Shower  Bathing parts Body parts bathed by patient: Right arm, Left arm, Chest, Abdomen, Front perineal area, Right upper leg, Left upper leg Body parts bathed by helper: Back, Left lower leg, Buttocks  Bathing assist Assist Level: Touching or steadying assistance(Pt > 75%)      Upper Body Dressing/Undressing Upper body dressing   What is the patient wearing?: Pull over shirt/dress     Pull over shirt/dress - Perfomed by patient: Thread/unthread right sleeve, Thread/unthread left sleeve, Put head through opening, Pull shirt over trunk          Upper body assist Assist Level: Set up   Set up : To obtain clothing/put away  Lower Body Dressing/Undressing Lower body dressing   What is the patient wearing?: Pants, AFO, Socks     Pants- Performed by patient: Thread/unthread right pants leg, Thread/unthread left pants leg,  Pull pants up/down Pants- Performed by helper: Thread/unthread right pants leg, Pull pants up/down   Non-skid slipper socks- Performed by helper: Don/doff left sock           AFO - Performed by helper: Don/doff left AFO, Don/doff right AFO      Lower body assist Assist for lower body dressing: Touching or steadying assistance (Pt > 75%)      Toileting Toileting   Toileting steps completed by patient: Adjust clothing prior to toileting, Performs perineal hygiene, Adjust clothing after toileting Toileting steps  completed by helper: Performs perineal hygiene, Adjust clothing after toileting Toileting Assistive Devices: Grab bar or rail  Toileting assist Assist level: Supervision or verbal cues   Transfers Chair/bed transfer   Chair/bed transfer method: Ambulatory Chair/bed transfer assist level: Supervision or verbal cues Chair/bed transfer assistive device: Armrests, Other (knee scooter)     Locomotion Ambulation     Max distance: 150 Assist level: Supervision or verbal cues   Wheelchair   Type: Manual Max wheelchair distance: 150 Assist Level: No help, No cues, assistive device, takes more than reasonable amount of time  Cognition Comprehension Comprehension assist level: Understands basic 75 - 89% of the time/ requires cueing 10 - 24% of the time  Expression Expression assist level: Expresses basic 90% of the time/requires cueing < 10% of the time.  Social Interaction Social Interaction assist level: Interacts appropriately 90% of the time - Needs monitoring or encouragement for participation or interaction.  Problem Solving Problem solving assist level: Solves basic 75 - 89% of the time/requires cueing 10 - 24% of the time  Memory Memory assist level: Recognizes or recalls 75 - 89% of the time/requires cueing 10 - 24% of the time    Medical Problem List and Plan: 1.  Limitations in self care, mobility deficits secondary to B/l embolic MCA infarcts.  CIR PT, OT SLP, Team conference today please see physician documentation under team conference tab, met with team face-to-face to discuss problems,progress, and goals. Formulized individual treatment plan based on medical history, underlying problem and comorbidities. 2.  DVT Prophylaxis/Anticoagulation: Pharmaceutical: Lovenox, resumed , on ASA for CVA prophyllaxis  , normal LE dopplers                3. Pain Management: tylenol prn.  4. Mood: LCSW to follow for evaluation and support.  Mild adjustment issues but denies anxiety/depression.  Monitor for now.   5. Neuropsych: This patient is capable of making decisions on his own behalf. 6. Skin/Wound Care: routine pressure relief measures. Cleanse incision with normal saline and apply Gentamicin cream every other day. 7. Fluids/Electrolytes/Nutrition: Monitor I/O. Educate patient on heart healthy diet. 8. Right forefoot reconstruction: WB through heel DARCO sandal  DPM note reviewed from 08/19/16, cont NWB and dressing change qod, recheck 4 wks, pt states that his f/u appt was 1/31,           9. H/o Gout: Stable. Monitor for now .No evidence of flare avoid NSAID due to CVA 10. Insomnia: managed by trazodone.  11.  New diagnosis Diabetes: Hgb A1c- 7.0. Consulted RD for education. Monitor BS ac/hs  CBG controlled on metformin 519m qam  Use SSI for elevated BS.    CBG (last 3)   Recent Labs  09/15/16 1627 09/15/16 2032 09/16/16 0646  GLUCAP 183* 137* 117*     12. Hypoalbuminemia  Supplement initiated 1/25  13. Diabetic neuropathy gabapentin 4029mQhs, will be d/c medLOS (Days) 14 A FACE TO FACE  EVALUATION WAS PERFORMED  Aaron Moon 09/16/2016 7:45 AM

## 2016-09-17 LAB — GLUCOSE, CAPILLARY: Glucose-Capillary: 131 mg/dL — ABNORMAL HIGH (ref 65–99)

## 2016-09-17 MED ORDER — TRAZODONE HCL 100 MG PO TABS: 100.0000 mg | ORAL_TABLET | Freq: Every day | ORAL | 0 refills | Status: AC

## 2016-09-17 MED ORDER — ATORVASTATIN CALCIUM 20 MG PO TABS: 20.0000 mg | ORAL_TABLET | Freq: Every day | ORAL | 0 refills | Status: AC

## 2016-09-17 MED ORDER — CYCLOBENZAPRINE HCL 5 MG PO TABS: 5.0000 mg | ORAL_TABLET | Freq: Three times a day (TID) | ORAL | 0 refills | Status: DC | PRN

## 2016-09-17 MED ORDER — POLYETHYLENE GLYCOL 3350 17 G PO PACK: 17.0000 g | PACK | Freq: Every day | ORAL | 0 refills | Status: DC | PRN

## 2016-09-17 MED ORDER — HYDROCERIN EX CREA: 1.0000 "application " | TOPICAL_CREAM | Freq: Every day | CUTANEOUS | 0 refills | Status: AC

## 2016-09-17 MED ORDER — METFORMIN HCL 500 MG PO TABS: 500.0000 mg | ORAL_TABLET | Freq: Every day | ORAL | 0 refills | Status: AC

## 2016-09-17 MED ORDER — BLOOD GLUCOSE MONITOR KIT: PACK | 0 refills | Status: AC

## 2016-09-17 MED ORDER — ASPIRIN 325 MG PO TBEC: 325.0000 mg | DELAYED_RELEASE_TABLET | Freq: Every day | ORAL | 0 refills | Status: DC

## 2016-09-17 MED ORDER — MUSCLE RUB 10-15 % EX CREA: 1.0000 "application " | TOPICAL_CREAM | Freq: Two times a day (BID) | CUTANEOUS | 0 refills | Status: DC | PRN

## 2016-09-17 MED ORDER — GABAPENTIN 400 MG PO CAPS: 400.0000 mg | ORAL_CAPSULE | Freq: Every day | ORAL | 0 refills | Status: DC

## 2016-09-17 NOTE — Plan of Care (Signed)
Problem: RH SKIN INTEGRITY Goal: RH STG SKIN FREE OF INFECTION/BREAKDOWN Patients skin will remain free from further breakdown or infection with min assist.  Microguard powder to abdominal fold for reddened area underneath fold

## 2016-09-17 NOTE — Progress Notes (Addendum)
Social Work  Discharge Note  The overall goal for the admission was met for:   Discharge location: Yes-HOME WITH INTERMITTENT ASSIST FROM [facility]  Length of Stay: Yes-15 DAYS  Discharge activity level: Yes-MOD/I-SUPERVISION LEVEL  Home/community participation: Yes  Services provided included: MD, RD, PT, OT, SLP, RN, CM, TR, Pharmacy, Neuropsych and SW  Financial Services: Private Insurance: FED BCBS  Follow-up services arranged: Home Health: [facility]-PT,OT,SP,RN,AIDE and Patient/Family request agency HH: ACTIVE PT WITH, DME: HAS ALL DME FROM PREVIOUS SURGERY  Comments (or additional information):PT FEELS READY TO GO HOME BUT DID WANT TO PURSUE [facility]. HE HAS BEEN APPROVED BUT AN AVAILABLE BED NEEDS TO BE FOUND. [employer] IS ONLY PROVIDING COVERAGE UNTIL TODAY. PT WANTS TO GO HOME THEN PURSUE NH IF DOESN'T WORK AT HOME. FRIEND TO CHECK ON HIM AND TRANSPORT HIM TO APPOINTMENTS. HIS MAIN GOAL IS TO GET TO MD APPOINTMENT TOMORROW TO GET THE PINS IN HIS FOOT REMOVED.  Patient/Family verbalized understanding of follow-up arrangements: Yes  Individual responsible for coordination of the follow-up plan: SELF  Confirmed correct DME delivered: Elease Hashimoto 09/17/2016    Elease Hashimoto

## 2016-09-17 NOTE — Discharge Instructions (Signed)
Inpatient Rehab Discharge Instructions  Doug SouDana Betancur Discharge date and time:  09/17/16  Activities/Precautions/ Functional Status: Activity: no lifting, driving, or strenuous exercise till cleared by MD Diet: cardiac diet and diabetic diet Wound Care: Keep right foot dry. Dressing change to be done at MD office tomorrow.   Functional status:  ___ No restrictions     ___ Walk up steps independently _X__ 24/7 supervision/assistance   ___ Walk up steps with assistance ___ Intermittent supervision/assistance  ___ Bathe/dress independently _X__ Walk with walker    _X__ Bathe/dress with supervision.  ___ Walk Independently    ___ Shower independently ___ Walk with assistance    ___ Shower with assistance _X__ No alcohol     ___ Return to work/school ________  Special Instructions: 1. Check blood sugars twice a day before meals--breakfast and supper one day alternating with lunch and bedtime the next day. Take readings/your machine with you to show to Dr. Jess BartersBorum.  2. Keep weight off your right forefoot. Use your knee walker, take your time and be aware of safety issues.  3. Do not skip meals.    COMMUNITY REFERRALS UPON DISCHARGE:    Home Health:   PT, OT, RN, SP, AIDE  Agency:[facility] Phone:[phone]   Date of last service:09/17/2016  Medical Equipment/Items Ordered:HAS FROM PREVIOUS SURGERIES  Other:[facility] TO PLACE ON BATH AIDE LIST, WORK ON LIFE ALERT AND OPTION OF [place]ING [place]: JENNIFER MISCHLER [phone] OR HER PAGER IS [phone]   GENERAL COMMUNITY RESOURCES FOR PATIENT/FAMILY: Support Groups:[facility] SUPPORT GROUP EVERY SECOND Thursday @ 3:00-4:00 PM ON THE REHAB UNIT QUESTIONS CONTACT [place] [phone]  STROKE/TIA DISCHARGE INSTRUCTIONS SMOKING Cigarette smoking nearly doubles your risk of having a stroke & is the single most alterable risk factor  If you smoke or have smoked in the last 12 months, you are advised to quit smoking  for your health.  Most of the excess cardiovascular risk related to smoking disappears within a year of stopping.  Ask you doctor about anti-smoking medications  [place] Quit Line: 1-800-QUIT NOW  Free Smoking Cessation Classes [phone]  CHOLESTEROL Know your levels; limit fat & cholesterol in your diet  Lipid Panel     Component Value Date/Time   CHOL 179 08/31/2016 0319   TRIG 291 (H) 08/31/2016 0319   HDL 30 (L) 08/31/2016 0319   CHOLHDL 6.0 08/31/2016 0319   VLDL 58 (H) 08/31/2016 0319   LDLCALC 91 08/31/2016 0319      Many patients benefit from treatment even if their cholesterol is at goal.  Goal: Total Cholesterol (CHOL) less than 160  Goal:  Triglycerides (TRIG) less than 150  Goal:  HDL greater than 40  Goal:  LDL (LDLCALC) less than 100   BLOOD PRESSURE American Stroke Association blood pressure target is less that 120/80 mm/Hg  Your discharge blood pressure is:  BP: 132/87  Monitor your blood pressure  Limit your salt and alcohol intake  Many individuals will require more than one medication for high blood pressure  DIABETES (A1c is a blood sugar average for last 3 months) Goal HGBA1c is under 7% (HBGA1c is blood sugar average for last 3 months)  Diabetes:     Lab Results  Component Value Date   HGBA1C 7.0 (H) 08/31/2016     Your HGBA1c can be lowered with medications, healthy diet, and exercise.  Check your blood sugar as directed by your physician  Call your physician if you experience unexplained or low  blood sugars.  PHYSICAL ACTIVITY/REHABILITATION Goal is 30 minutes at least 4 days per week  Activity: No driving, Therapies: See above Return to work:  N/A  Activity decreases your risk of heart attack and stroke and makes your heart stronger.  It helps control your weight and blood pressure; helps you relax and can improve your mood.  Participate in a regular exercise program.  Talk with your doctor about the best form of exercise for you  (dancing, walking, swimming, cycling).  DIET/WEIGHT Goal is to maintain a healthy weight  Your discharge diet is: Diet heart healthy/carb modified Room service appropriate? Yes; Fluid consistency: Thin liquids Your height is:  Height: 5\' 7"  (170.2 cm) Your current weight is: Weight: 115.8 kg (255 lb 4.7 oz) Your Body Mass Index (BMI) is:  BMI (Calculated): 40.1  Following the type of diet specifically designed for you will help prevent another stroke.  Your goal weight is:  159 lbs  Your goal Body Mass Index (BMI) is 19-24.  Healthy food habits can help reduce 3 risk factors for stroke:  High cholesterol, hypertension, and excess weight.  RESOURCES Stroke/Support Group:  Call [phone]   STROKE EDUCATION PROVIDED/REVIEWED AND GIVEN TO PATIENT Stroke warning signs and symptoms How to activate emergency medical system (call 911). Medications prescribed at discharge. Need for follow-up after discharge. Personal risk factors for stroke. Pneumonia vaccine given:  Flu vaccine given:  My questions have been answered, the writing is legible, and I understand these instructions.  I will adhere to these goals & educational materials that have been provided to me after my discharge from the hospital.     My questions have been answered and I understand these instructions. I will adhere to these goals and the provided educational materials after my discharge from the hospital.  Patient/Caregiver Signature _______________________________ Date __________  Clinician Signature _______________________________________ Date __________  Please bring this form and your medication list with you to all your follow-up doctor's appointments.

## 2016-09-17 NOTE — Progress Notes (Signed)
Speech Language Pathology Discharge Summary  Patient Details  Name: Everett Ehrler MRN: [id number] Date of Birth: 08-08-1946   Patient has met 5 of 5 long term goals.  Patient to discharge at overall Supervision;Min level.   Reasons goals not met: N/A   Clinical Impression/Discharge Summary:  Patient has made functional gains and has met 5 of 5 LTG's this admission due to improved cognitive functioning.  Currently, patient requires overall supervision verbal cues for functional problem solving with basic tasks and selective attention and Min A verbal cues for complex problem solving and recall of new information with use of external aids. Patient education is complete and patient will discharge home with intermittent supervision. Patient would benefit from f/u home health SLP services to maximize his cognitive function and overall functional independence.   Care Partner:  Caregiver Able to Provide Assistance: No  Type of Caregiver Assistance: Physical;Cognitive  Recommendation:  Home Health SLP  Rationale for SLP Follow Up: Maximize cognitive function and independence   Equipment: N/A   Reasons for discharge: Treatment goals met;Discharged from hospital   Patient/Family Agrees with Progress Made and Goals Achieved: Yes       [place], [place] 09/17/2016, 7:05 AM

## 2016-09-17 NOTE — Progress Notes (Signed)
Patient discussed all the discharged instructions with Pam Love,PA..,before he left.

## 2016-09-17 NOTE — Progress Notes (Signed)
Social Work Patient ID: Aaron Moon, male   DOB: 06-Oct-1945, 71 y.o.   MRN: [id number]  Met with pt to discuss [facility] did call me back and inform that pt is eligible to go to a NH and give pt the list of facilities to ask which one he wants this worker to pursue. He will see how he does and contact me Friday or Monday to let me know what he wants me to do. The closest facility is in [place], will contact them today to see if has available bed in case pt wants to pursue this after getting home and seeing how it goes.

## 2016-09-17 NOTE — Progress Notes (Signed)
[place] PHYSICAL MEDICINE & REHABILITATION     PROGRESS NOTE  Subjective/Complaints:   No issues overnite, pt feel ok this am.  RIde coming around 10a ROS: Denies CP, SOB, N/V/D.  Objective: Vital Signs: Blood pressure 126/64, pulse 74, temperature 98.3 F (36.8 C), temperature source Oral, resp. rate 18, height 5\' 7"  (1.702 m), weight 118.5 kg (261 lb 2.2 oz), SpO2 99 %. No results found.  Recent Labs  09/16/16 0513  WBC 8.5  HGB 12.3*  HCT 38.4*  PLT 271    Recent Labs  09/16/16 0513  NA 139  K 4.2  CL 104  GLUCOSE 128*  BUN 19  CREATININE 1.19  CALCIUM 9.2   CBG (last 3)   Recent Labs  09/16/16 1638 09/16/16 2033 09/17/16 0631  GLUCAP 161* 158* 131*    Wt Readings from Last 3 Encounters:  09/16/16 118.5 kg (261 lb 2.2 oz)  08/31/16 117.6 kg (259 lb 4.2 oz)  08/08/15 129.3 kg (285 lb)    Physical Exam:  BP 126/64 (BP Location: Left Arm)   Pulse 74   Temp 98.3 F (36.8 C) (Oral)   Resp 18   Ht 5\' 7"  (1.702 m)   Wt 118.5 kg (261 lb 2.2 oz)   SpO2 99%   BMI 40.90 kg/m  Constitutional: He appears well-developed. Obese  HENT: Normocephalic and atraumatic.  Eyes: EOM are normal. No discharge.  Cardiovascular: Normal rate and regular rhythm.  No JVD. Respiratory: Effort normal and breath sounds normal.  GI: Soft. Bowel sounds are normal. Obese  Musculoskeletal: He exhibits no edema or tenderness. Extensive surgical scarring dorsum L foot and medial to 1 st MTP, well healed, mild toe edema , no hypersensitivity or tenderness Neurological: He is alert and oriented. Sensation normal to LT on left foot Mild right facial weakness.  Speech clear.  Able to follow commands.  Motor: RUE: 4/5 proximal to distal, mod/severe dysmetria and ataxia RLE: 5/5 proximally, boot distally LUE: 4+/5 proximal to distal LLE: 5/5 proximal to distal   Psychiatric: normal affect today Ext mild edema left pedal no pain to palpation or with ROM  Assessment/Plan: 1.  Functional deficits secondary to B/l embolic MCA infarcts Stable for D/C today F/u PCP in 3-4 weeks F/u PM&R 2 weeks See D/C summary See D/C instructions  Function:  Bathing Bathing position   Position: Shower  Bathing parts Body parts bathed by patient: Right arm, Left arm, Chest, Abdomen, Front perineal area, Right upper leg, Left upper leg, Buttocks, Left lower leg, Back Body parts bathed by helper: Back, Left lower leg, Buttocks  Bathing assist Assist Level: Supervision or verbal cues (with use of long handled sponge)      Upper Body Dressing/Undressing Upper body dressing   What is the patient wearing?: Pull over shirt/dress     Pull over shirt/dress - Perfomed by patient: Thread/unthread right sleeve, Thread/unthread left sleeve, Put head through opening, Pull shirt over trunk          Upper body assist Assist Level: Set up   Set up : To obtain clothing/put away  Lower Body Dressing/Undressing Lower body dressing   What is the patient wearing?: Pants, Socks, Shoes     Pants- Performed by patient: Thread/unthread right pants leg, Thread/unthread left pants leg, Pull pants up/down Pants- Performed by helper: Thread/unthread right pants leg, Pull pants up/down Non-skid slipper socks- Performed by patient: Don/doff left sock Non-skid slipper socks- Performed by helper: Don/doff left sock  Shoes - Performed by patient: Don/doff right shoe, Don/doff left shoe, Fasten right, Fasten left     AFO - Performed by helper: Don/doff left AFO, Don/doff right AFO      Lower body assist Assist for lower body dressing: Supervision or verbal cues      Toileting Toileting   Toileting steps completed by patient: Adjust clothing prior to toileting, Performs perineal hygiene, Adjust clothing after toileting Toileting steps completed by helper: Performs perineal hygiene, Adjust clothing after toileting Toileting Assistive Devices: Grab bar or rail  Toileting assist Assist  level: Supervision or verbal cues   Transfers Chair/bed transfer   Chair/bed transfer method: Stand pivot, Ambulatory Chair/bed transfer assist level: Supervision or verbal cues Chair/bed transfer assistive device: Armrests, Other, Patent attorney     Max distance: 300 Assist level: Supervision or verbal cues   Wheelchair   Type: Manual Max wheelchair distance: 150 Assist Level: No help, No cues, assistive device, takes more than reasonable amount of time  Cognition Comprehension Comprehension assist level: Understands complex 90% of the time/cues 10% of the time  Expression Expression assist level: Expresses complex 90% of the time/cues < 10% of the time  Social Interaction Social Interaction assist level: Interacts appropriately 90% of the time - Needs monitoring or encouragement for participation or interaction.  Problem Solving Problem solving assist level: Solves complex 90% of the time/cues < 10% of the time  Memory Memory assist level: Recognizes or recalls 75 - 89% of the time/requires cueing 10 - 24% of the time    Medical Problem List and Plan: 1.  Limitations in self care, mobility deficits secondary to B/l embolic MCA infarcts.  CIR PT, OT SLP,stable for D/C  2.  DVT Prophylaxis/Anticoagulation: Pharmaceutical: Lovenox, resumed , on ASA for CVA prophyllaxis  , normal LE dopplers                3. Pain Management: tylenol prn.  4. Mood: LCSW to follow for evaluation and support.  Mild adjustment issues but denies anxiety/depression. Monitor for now.   5. Neuropsych: This patient is capable of making decisions on his own behalf. 6. Skin/Wound Care: routine pressure relief measures. Cleanse incision with normal saline and apply Gentamicin cream every other day. 7. Fluids/Electrolytes/Nutrition: Monitor I/O. Educate patient on heart healthy diet. 8. Right forefoot reconstruction: WB through heel DARCO sandal  DPM note reviewed from 08/19/16, cont NWB and  dressing change qod, recheck 4 wks, pt states that his f/u appt was 1/31,           9. H/o Gout: Stable. Monitor for now .No evidence of flare avoid NSAID due to CVA 10. Insomnia: managed by trazodone.  11.  New diagnosis Diabetes: Hgb A1c- 7.0. Consulted RD for education. Monitor BS ac/hs  CBG controlled on metformin 500mg  qam  Use SSI for elevated BS.    CBG (last 3)   Recent Labs  09/16/16 1638 09/16/16 2033 09/17/16 0631  GLUCAP 161* 158* 131*     12. Hypoalbuminemia  Supplement initiated 1/25  13. Diabetic neuropathy gabapentin 400mg  Qhs, will be d/c med  LOS (Days) 15 A FACE TO FACE EVALUATION WAS PERFORMED  Aaron Moon 09/17/2016 7:00 AM

## 2016-09-18 ENCOUNTER — Telehealth: Payer: Self-pay

## 2016-09-18 ENCOUNTER — Ambulatory Visit (INDEPENDENT_AMBULATORY_CARE_PROVIDER_SITE_OTHER): Payer: Self-pay | Admitting: Podiatry

## 2016-09-18 ENCOUNTER — Ambulatory Visit (INDEPENDENT_AMBULATORY_CARE_PROVIDER_SITE_OTHER): Payer: Federal, State, Local not specified - PPO

## 2016-09-18 DIAGNOSIS — M21611 Bunion of right foot: Secondary | ICD-10-CM

## 2016-09-18 DIAGNOSIS — Z9889 Other specified postprocedural states: Secondary | ICD-10-CM

## 2016-09-18 DIAGNOSIS — M2011 Hallux valgus (acquired), right foot: Secondary | ICD-10-CM | POA: Diagnosis not present

## 2016-09-18 DIAGNOSIS — M2041 Other hammer toe(s) (acquired), right foot: Secondary | ICD-10-CM

## 2016-09-18 NOTE — Telephone Encounter (Signed)
Transitional Care call  Patient name: Aaron Moon DOB: 1946-03-26 1. Are you/is patient experiencing any problems since coming home? NO a. Are there any questions regarding any aspect of care? NO 2. Are there any questions regarding medications administration/dosing? NO a. Are meds being taken as prescribed? YES b. "Patient should review meds with caller to confirm" 3. Have there been any falls? NO 4. Has Home Health been to the house and/or have they contacted you? YES a. If not, have you tried to contact them? NO b. Can we help you contact them? NO 5. Are bowels and bladder emptying properly? NO a. Are there any unexpected incontinence issues? NO b. If applicable, is patient following bowel/bladder programs? NO 6. Any fevers, problems with breathing, unexpected pain? NO 7. Are there any skin problems or new areas of breakdown? NO 8. Has the patient/family member arranged specialty MD follow up (ie cardiology/neurology/renal/surgical/etc.)?  YES a. Can we help arrange? NO 9. Does the patient need any other services or support that we can help arrange? NO 10. Are caregivers following through as expected in assisting the patient? YES 11. Has the patient quit smoking, drinking alcohol, or using drugs as recommended? NA  Appointment time 09-30-16-1020AM, arrive time 1005AM and who it is with here DR. PATEL [address]

## 2016-09-22 ENCOUNTER — Telehealth: Payer: Self-pay | Admitting: *Deleted

## 2016-09-22 NOTE — Telephone Encounter (Signed)
Yes. Please order.  Dr. Logan BoresEvans

## 2016-09-22 NOTE — Telephone Encounter (Addendum)
Aaron Moon - [facility] requests verbal order for PT for pt 2xweek for 4 weeks, starting 09/20/2016. 09/23/2016-Dr. Logan BoresEvans ordered evaluation and treatment of pt for post op PT at home.11/16/2016-Michelle - [facility] request nursing orders for 2x week for 3 weeks, 1x week for 5 weeks and 2 prn visits.11/17/2016-Left message informing Aaron Moon - [facility] that Dr. Logan BoresEvans had okayed the nursing care visits detailed yesterday.01/06/2017-Pt states he needs documentation to explain his absence from 05/28/2016.

## 2016-09-23 ENCOUNTER — Telehealth: Payer: Self-pay | Admitting: *Deleted

## 2016-09-23 DIAGNOSIS — I480 Paroxysmal atrial fibrillation: Secondary | ICD-10-CM | POA: Insufficient documentation

## 2016-09-23 NOTE — Telephone Encounter (Signed)
Spoke with patient regarding new atrial fibrillation episode (duration 54sec) on 09/21/16.  Episode was reviewed by Dr. Graciela HusbandsKlein, who recommended scheduling an appointment with [facility].  Patient is agreeable to an appointment with Rudi Cocoonna Carroll, NP, on Thursday, 09/24/16 at 2:00pm.  He requests that I call back and leave him a VM with parking code and address.  Left VM as patient requested.  Gave [facility] Clinic phone number for additional questions/concerns.

## 2016-09-24 ENCOUNTER — Encounter (HOSPITAL_COMMUNITY): Payer: Self-pay | Admitting: Nurse Practitioner

## 2016-09-24 ENCOUNTER — Ambulatory Visit (HOSPITAL_COMMUNITY)
Admission: RE | Admit: 2016-09-24 | Discharge: 2016-09-24 | Disposition: A | Discharge status: Home / Self Care | Payer: Federal, State, Local not specified - PPO | Source: Ambulatory Visit | Attending: Nurse Practitioner | Admitting: Nurse Practitioner

## 2016-09-24 VITALS — BP 136/92 | HR 89 | Ht 67.0 in | Wt 254.6 lb

## 2016-09-24 DIAGNOSIS — E669 Obesity, unspecified: Secondary | ICD-10-CM | POA: Insufficient documentation

## 2016-09-24 DIAGNOSIS — Z8673 Personal history of transient ischemic attack (TIA), and cerebral infarction without residual deficits: Secondary | ICD-10-CM | POA: Insufficient documentation

## 2016-09-24 DIAGNOSIS — M109 Gout, unspecified: Secondary | ICD-10-CM | POA: Diagnosis not present

## 2016-09-24 DIAGNOSIS — Z6839 Body mass index (BMI) 39.0-39.9, adult: Secondary | ICD-10-CM | POA: Insufficient documentation

## 2016-09-24 DIAGNOSIS — Z7984 Long term (current) use of oral hypoglycemic drugs: Secondary | ICD-10-CM | POA: Diagnosis not present

## 2016-09-24 DIAGNOSIS — I480 Paroxysmal atrial fibrillation: Secondary | ICD-10-CM

## 2016-09-24 DIAGNOSIS — I4891 Unspecified atrial fibrillation: Secondary | ICD-10-CM | POA: Diagnosis not present

## 2016-09-24 DIAGNOSIS — Z87891 Personal history of nicotine dependence: Secondary | ICD-10-CM | POA: Diagnosis not present

## 2016-09-24 DIAGNOSIS — Z820 Family history of epilepsy and other diseases of the nervous system: Secondary | ICD-10-CM | POA: Insufficient documentation

## 2016-09-24 DIAGNOSIS — Z9889 Other specified postprocedural states: Secondary | ICD-10-CM | POA: Insufficient documentation

## 2016-09-24 DIAGNOSIS — E785 Hyperlipidemia, unspecified: Secondary | ICD-10-CM | POA: Insufficient documentation

## 2016-09-24 DIAGNOSIS — Z800 Family history of malignant neoplasm of digestive organs: Secondary | ICD-10-CM | POA: Insufficient documentation

## 2016-09-24 MED ORDER — APIXABAN 5 MG PO TABS: 5.0000 mg | ORAL_TABLET | Freq: Two times a day (BID) | ORAL | 0 refills | Status: AC

## 2016-09-24 NOTE — Patient Instructions (Signed)
Your physician has recommended you make the following change in your medication:  1)Stop aspirin 2)Start Eliquis 5mg twice a day  

## 2016-09-25 ENCOUNTER — Telehealth: Payer: Self-pay

## 2016-09-25 NOTE — Telephone Encounter (Signed)
Called to verify appointment time and place and to have an unused phone number removed from his chart

## 2016-09-25 NOTE — Progress Notes (Signed)
Primary Care Physician: Pcp Not In System Referring Physician: Dr. Susanne Greenhouse Leazer is a 71 y.o. male with a h/o gout, obesity, right foot surgery 07/2016--PWB who was admitted on 08/30/16 with sudden onset of right arm weakness and tPA administered at [facility] prior to transfer to [facility]. On admission, patient noted to have  RUE> RLE weakness as well as facial weakness. CTA head/neck with moderate proximal L-VA stenosis and mild intracranial atherosclerosis. MRI brain reviewed, showing patchy LEFT MCA infarct with petechial hemorrhage and small RIGHT nonhemorrhagic MCA territory infarct concerning for central embolic etiology. TEE done 1/24 revealing small PFO, EF 60-65% and trivial MR. BLE dopplers negative and Loop recorder placed 1/24.    Pt is in afib clinic today for linq showing new onset afib with 54 sec of afib recorded. Dr. Caryl Comes reviewed and referred here for initiation of DOAC. Pt has been on 325 mg ASA. He was unaware of the short episode of irregular heart beat.  Today, he denies symptoms of palpitations, chest pain, shortness of breath, orthopnea, PND, lower extremity edema, dizziness, presyncope, syncope, or neurologic sequela. The patient is tolerating medications without difficulties and is otherwise without complaint today.   Past Medical History:  Diagnosis Date  . Gout   . Hyperlipidemia   . Obesity    Past Surgical History:  Procedure Laterality Date  . ABDOMINAL SURGERY     colostomy & colostomy reversal  . ANKLE SURGERY    . EP IMPLANTABLE DEVICE N/A 09/02/2016   Procedure: Loop Recorder Insertion;  Surgeon: Deboraha Sprang, MD;  Location: [place] CV LAB;  Service: Cardiovascular;  Laterality: N/A;  . TEE WITHOUT CARDIOVERSION N/A 09/02/2016   Procedure: TRANSESOPHAGEAL ECHOCARDIOGRAM (TEE);  Surgeon: Larey Dresser, MD;  Location: [facility] [place] ENDOSCOPY;  Service: Cardiovascular;  Laterality: N/A;    Current Outpatient Prescriptions  Medication Sig Dispense Refill    . albuterol (PROVENTIL HFA;VENTOLIN HFA) 108 (90 BASE) MCG/ACT inhaler Inhale 2 puffs into the lungs every 6 (six) hours as needed for wheezing. 1 Inhaler 0  . atorvastatin (LIPITOR) 20 MG tablet Take 1 tablet (20 mg total) by mouth daily at 6 PM. 90 tablet 0  . blood glucose meter kit and supplies KIT Dispense based on patient and insurance preference. Use up to four times daily as directed. (FOR ICD-9 250.00, 250.01). 1 each 0  . cyclobenzaprine (FLEXERIL) 5 MG tablet Take 1 tablet (5 mg total) by mouth 3 (three) times daily as needed for muscle spasms. 30 tablet 0  . gabapentin (NEURONTIN) 400 MG capsule Take 1 capsule (400 mg total) by mouth at bedtime. 90 capsule 0  . gentamicin cream (GARAMYCIN) 0.1 % Apply 1 application topically 3 (three) times daily. (Patient taking differently: Apply 1 application topically daily as needed (wound care/dressing change). ) 30 g 1  . hydrocerin (EUCERIN) CREA Apply 1 application topically daily. 454 g 0  . Menthol-Methyl Salicylate (MUSCLE RUB) 10-15 % CREA Apply 1 application topically 2 (two) times daily as needed for muscle pain. 113 g 0  . metFORMIN (GLUCOPHAGE) 500 MG tablet Take 1 tablet (500 mg total) by mouth daily with breakfast. 90 tablet 0  . traZODone (DESYREL) 100 MG tablet Take 1 tablet (100 mg total) by mouth at bedtime. 30 tablet 0  . apixaban (ELIQUIS) 5 MG TABS tablet Take 1 tablet (5 mg total) by mouth 2 (two) times daily. 60 tablet 0   No current facility-administered medications for this encounter.  No Known Allergies  Social History   Social History  . Marital status: Single    Spouse name: N/A  . Number of children: N/A  . Years of education: N/A   Occupational History  . Not on file.   Social History Main Topics  . Smoking status: Former Research scientist (life sciences)  . Smokeless tobacco: Never Used  . Alcohol use Yes  . Drug use: No  . Sexual activity: No   Other Topics Concern  . Not on file   Social History Narrative  . No  narrative on file    Family History  Problem Relation Age of Onset  . ALS Mother   . Pancreatic cancer Father     ROS- All systems are reviewed and negative except as per the HPI above  Physical Exam: Vitals:   09/24/16 1418  BP: (!) 136/92  Pulse: 89  Weight: 254 lb 9.6 oz (115.5 kg)  Height: '5\' 7"'  (1.702 m)   Wt Readings from Last 3 Encounters:  09/24/16 254 lb 9.6 oz (115.5 kg)  09/16/16 261 lb 2.2 oz (118.5 kg)  08/31/16 259 lb 4.2 oz (117.6 kg)    Labs: Lab Results  Component Value Date   NA 139 09/16/2016   K 4.2 09/16/2016   CL 104 09/16/2016   CO2 24 09/16/2016   GLUCOSE 128 (H) 09/16/2016   BUN 19 09/16/2016   CREATININE 1.19 09/16/2016   CALCIUM 9.2 09/16/2016   Lab Results  Component Value Date   INR 0.96 08/30/2016   Lab Results  Component Value Date   CHOL 179 08/31/2016   HDL 30 (L) 08/31/2016   LDLCALC 91 08/31/2016   TRIG 291 (H) 08/31/2016     GEN- The patient is well appearing, alert and oriented x 3 today.   Head- normocephalic, atraumatic Eyes-  Sclera clear, conjunctiva pink Ears- hearing intact Oropharynx- clear Neck- supple, no JVP Lymph- no cervical lymphadenopathy Lungs- Clear to ausculation bilaterally, normal work of breathing Heart- Regular rate and rhythm, no murmurs, rubs or gallops, PMI not laterally displaced GI- soft, NT, ND, + BS Extremities- no clubbing, cyanosis, or edema MS- no significant deformity or atrophy Skin- no rash or lesion Psych- euthymic mood, full affect Neuro- strength and sensation are intact  EKG-NSR at 89 bpm, pr int 148 ms, qtc 440 ms Epic records reviewed   Assessment and Plan: 1. Afib by linq in setting of recent CVA Pt denies any bleeding history/tendencies He will stop asa and start eliquis 5 mg bid for chadsvasc score of at least 5 Bleeding precautions discussed General education re afib, pt burden very low and asymptomatic at this point Not requiring any further treatment  F/u in  3 weeks with CBC  Butch Penny C. Carroll, [facility] [address] [place], [place] [address] [phone]

## 2016-09-30 ENCOUNTER — Encounter: Payer: Self-pay | Admitting: Physical Medicine & Rehabilitation

## 2016-09-30 ENCOUNTER — Encounter
Payer: Federal, State, Local not specified - PPO | Attending: Physical Medicine & Rehabilitation | Admitting: Physical Medicine & Rehabilitation

## 2016-09-30 ENCOUNTER — Encounter: Payer: Federal, State, Local not specified - PPO | Admitting: Physical Medicine & Rehabilitation

## 2016-09-30 VITALS — BP 134/98 | HR 83 | Resp 14

## 2016-09-30 DIAGNOSIS — Z933 Colostomy status: Secondary | ICD-10-CM | POA: Insufficient documentation

## 2016-09-30 DIAGNOSIS — I69393 Ataxia following cerebral infarction: Secondary | ICD-10-CM

## 2016-09-30 DIAGNOSIS — I10 Essential (primary) hypertension: Secondary | ICD-10-CM | POA: Diagnosis not present

## 2016-09-30 DIAGNOSIS — Z800 Family history of malignant neoplasm of digestive organs: Secondary | ICD-10-CM | POA: Insufficient documentation

## 2016-09-30 DIAGNOSIS — E1159 Type 2 diabetes mellitus with other circulatory complications: Secondary | ICD-10-CM | POA: Insufficient documentation

## 2016-09-30 DIAGNOSIS — G479 Sleep disorder, unspecified: Secondary | ICD-10-CM | POA: Diagnosis not present

## 2016-09-30 DIAGNOSIS — M109 Gout, unspecified: Secondary | ICD-10-CM | POA: Diagnosis not present

## 2016-09-30 DIAGNOSIS — Z820 Family history of epilepsy and other diseases of the nervous system: Secondary | ICD-10-CM | POA: Diagnosis not present

## 2016-09-30 DIAGNOSIS — Z87891 Personal history of nicotine dependence: Secondary | ICD-10-CM | POA: Insufficient documentation

## 2016-09-30 DIAGNOSIS — I69392 Facial weakness following cerebral infarction: Secondary | ICD-10-CM | POA: Insufficient documentation

## 2016-09-30 DIAGNOSIS — E785 Hyperlipidemia, unspecified: Secondary | ICD-10-CM | POA: Insufficient documentation

## 2016-09-30 DIAGNOSIS — Z9889 Other specified postprocedural states: Secondary | ICD-10-CM | POA: Diagnosis not present

## 2016-09-30 DIAGNOSIS — I6930 Unspecified sequelae of cerebral infarction: Secondary | ICD-10-CM | POA: Insufficient documentation

## 2016-09-30 NOTE — Progress Notes (Signed)
Subjective:    Patient ID: Aaron Moon, male    DOB: 1946-03-19, 71 y.o.   MRN: [id number]  HPI  71 y.o.R-handed male with history of gout, obesity, right foot surgery 07/2016--PWB who presents for transitional care management after receiving CIR for b/l embolic MCA infarcts.   Admit date: 09/02/2016 Discharge date: 09/17/2016  At discharge, he was instructed to check CBGs, but he has not been able to do that.  Initially states he does not have the dexterity, but later states he is fearful. He is not WBAT in RLE.  He states he saw Neurology, but not record.  He saw Podiatry, cleared WBAT, pins removed. He saw PCP yesterday. He does not sleep well.  He notes last night was the first night he was not able to sleep well.   DME: No additional required Mobility: Walker at all times Therapies: first eval yesterday.  Pain Inventory Average Pain 0 Pain Right Now 0 My pain is no pain  In the last 24 hours, has pain interfered with the following? General activity 0 Relation with others 0 Enjoyment of life 0 What TIME of day is your pain at its worst? no pain Sleep (in general) Fair  Pain is worse with: no pain Pain improves with: no pain Relief from Meds: no pain  Mobility walk with assistance use a walker how many minutes can you walk? 5 ability to climb steps?  no do you drive?  no use a wheelchair needs help with transfers  Function employed # of hrs/week 0 I need assistance with the following:  feeding, meal prep, household duties and shopping  Neuro/Psych weakness numbness trouble walking  Prior Studies Any changes since last visit?  no  Physicians involved in your care Any changes since last visit?  no   Family History  Problem Relation Age of Onset  . ALS Mother   . Pancreatic cancer Father    Social History   Social History  . Marital status: Single    Spouse name: N/A  . Number of children: N/A  . Years of education: N/A   Social History Main  Topics  . Smoking status: Former Games developer  . Smokeless tobacco: Never Used  . Alcohol use Yes  . Drug use: No  . Sexual activity: No   Other Topics Concern  . None   Social History Narrative  . None   Past Surgical History:  Procedure Laterality Date  . ABDOMINAL SURGERY     colostomy & colostomy reversal  . ANKLE SURGERY    . EP IMPLANTABLE DEVICE N/A 09/02/2016   Procedure: Loop Recorder Insertion;  Surgeon: Duke Salvia, MD;  Location[facility] INVASIVE CV LAB;  Service: Cardiovascular;  Laterality: N/A;  . TEE WITHOUT CARDIOVERSION N/A 09/02/2016   Procedure: TRANSESOPHAGEAL ECHOCARDIOGRAM (TEE);  Surgeon: Laurey Morale, MD;  Location: [facility] ENDOSCOPY;  Service: Cardiovascular;  Laterality: N/A;   Past Medical History:  Diagnosis Date  . Gout   . Hyperlipidemia   . Obesity    BP (!) 134/98   Pulse 83   Resp 14   SpO2 95%   Opioid Risk Score:   Fall Risk Score:  `1  Depression screen PHQ 2/9  Depression screen [facility] 2/9 09/30/2016 07/22/2015  Decreased Interest 0 0  Down, Depressed, Hopeless 0 0  PHQ - 2 Score 0 0  Altered sleeping 0 -  Tired, decreased energy 0 -  Change in appetite 0 -  Feeling bad or failure about yourself  0 -  Trouble concentrating 0 -  Moving slowly or fidgety/restless 0 -  Suicidal thoughts 0 -  PHQ-9 Score 0 -  Difficult doing work/chores Not difficult at all -    Review of Systems  HENT: Negative.   Eyes: Negative.   Respiratory: Positive for shortness of breath.   Cardiovascular: Positive for leg swelling.  Gastrointestinal: Negative.   Endocrine: Negative.   Genitourinary: Negative.   Musculoskeletal: Positive for gait problem.  Skin: Negative.   Allergic/Immunologic: Negative.   Neurological: Positive for weakness and numbness.  Hematological: Negative.   Psychiatric/Behavioral: Negative.   All other systems reviewed and are negative.      Objective:   Physical Exam Constitutional: He appears well-developed. Obese HENT:  Normocephalic and atraumatic.  Eyes: EOMI. No discharge.  Cardiovascular: RRR. No JVD. Respiratory: Effort normal and breath sounds normal.  GI: Soft. Bowel sounds are normal. Obese Musculoskeletal: Right foot edema. No. tenderness. Neurological: He is alert and oriented.  Mild right facial weakness.  Motor: RUE: 4+/5 proximal to distal, Dysmetria and ataxia RLE: 5/5 proximally, boot distally LUE: 4+/5 proximal to distal LLE: 5/5 proximal to distal No increase in tone. Psych: ?Mild inappropriate laughter, denies crying    Assessment & Plan:  71 y.o.R-handed male with history of gout, obesity, right foot surgery 07/2016--PWB who presents for transitional care management after receiving CIR for b/l embolic MCA infarcts.   1. B/l embolic MCA infarcts.  Cont meds  Cont therapies  Cont follow up with Neurology  2. Right forefoot reconstruction:   WBAT now per pt   Cont follow with podiatry  3. H/o Gout:   Stable  4. Sleep disturbance  Overall sleeping well, ~7 hours, didn't sleep well last night  5. New diagnosis Diabetes: Hgb A1c- 7.0.   Encouraged compliance of CBGs, pt not performing checks due to fear.   6. Morbid Obesity  Cont weight loss  7. HTN  Slightly elevated  Encouraged follow up with PCP  Referrals reviewed: Likely needs appointment with Neurology Meds reviewed All questions answered

## 2016-09-30 NOTE — Patient Instructions (Addendum)
Please schedule follow up with :  Delia HeadySETHI,PRAMOD, MD.   Specialties:  Neurology, Radiology Contact information: [address] [place] [id number] [phone]

## 2016-10-02 ENCOUNTER — Ambulatory Visit (INDEPENDENT_AMBULATORY_CARE_PROVIDER_SITE_OTHER): Payer: Federal, State, Local not specified - PPO | Admitting: *Deleted

## 2016-10-02 DIAGNOSIS — I480 Paroxysmal atrial fibrillation: Secondary | ICD-10-CM

## 2016-10-03 NOTE — Progress Notes (Signed)
Subjective: Patient presents today status post forefoot reconstruction the right lower extremity. Date of surgery 07/30/2016. Patient states that he is doing very well. Patient also underwent left lower extremity forefoot reconstruction on 05/28/2016. Doing well. There is a moderate amount of edema noted otherwise patient states that he is doing well.  Objective: Percutaneous fixation pins are intact. Moderate amount of edema noted. Skin incisions are completely healed.  Assessment: #1 Status post forefoot reconstructive surgery right lower extremity. Date of surgery 07/30/2016.  #2 The status post forefoot reconstructive surgery left lower extremity. Date of surgery 05/28/2016   Plan of care: Patient was evaluated. Percutaneous fixation pins were removed today. Patient can begin weightbearing. Continue anti-inflammatory medication. Return to clinic in 3 months. Recommend purchasing new walking shoes.

## 2016-10-05 NOTE — Progress Notes (Signed)
Carelink Summary Report / Loop Recorder 

## 2016-10-15 ENCOUNTER — Encounter (HOSPITAL_COMMUNITY): Payer: Self-pay | Admitting: Nurse Practitioner

## 2016-10-15 ENCOUNTER — Ambulatory Visit (HOSPITAL_COMMUNITY)
Admission: RE | Admit: 2016-10-15 | Discharge: 2016-10-15 | Disposition: A | Discharge status: Home / Self Care | Payer: Federal, State, Local not specified - PPO | Source: Ambulatory Visit | Attending: Nurse Practitioner | Admitting: Nurse Practitioner

## 2016-10-15 VITALS — BP 136/82 | HR 79 | Ht 67.0 in | Wt 253.6 lb

## 2016-10-15 DIAGNOSIS — E669 Obesity, unspecified: Secondary | ICD-10-CM | POA: Insufficient documentation

## 2016-10-15 DIAGNOSIS — M109 Gout, unspecified: Secondary | ICD-10-CM | POA: Diagnosis not present

## 2016-10-15 DIAGNOSIS — Z6839 Body mass index (BMI) 39.0-39.9, adult: Secondary | ICD-10-CM | POA: Insufficient documentation

## 2016-10-15 DIAGNOSIS — Z7902 Long term (current) use of antithrombotics/antiplatelets: Secondary | ICD-10-CM | POA: Diagnosis not present

## 2016-10-15 DIAGNOSIS — I4891 Unspecified atrial fibrillation: Secondary | ICD-10-CM | POA: Diagnosis not present

## 2016-10-15 DIAGNOSIS — I480 Paroxysmal atrial fibrillation: Secondary | ICD-10-CM

## 2016-10-15 DIAGNOSIS — Z87891 Personal history of nicotine dependence: Secondary | ICD-10-CM | POA: Insufficient documentation

## 2016-10-15 DIAGNOSIS — E785 Hyperlipidemia, unspecified: Secondary | ICD-10-CM | POA: Diagnosis not present

## 2016-10-15 DIAGNOSIS — Z7984 Long term (current) use of oral hypoglycemic drugs: Secondary | ICD-10-CM | POA: Diagnosis not present

## 2016-10-15 DIAGNOSIS — Z8673 Personal history of transient ischemic attack (TIA), and cerebral infarction without residual deficits: Secondary | ICD-10-CM | POA: Diagnosis not present

## 2016-10-15 DIAGNOSIS — Z933 Colostomy status: Secondary | ICD-10-CM | POA: Diagnosis not present

## 2016-10-15 DIAGNOSIS — Z800 Family history of malignant neoplasm of digestive organs: Secondary | ICD-10-CM | POA: Insufficient documentation

## 2016-10-15 DIAGNOSIS — Z820 Family history of epilepsy and other diseases of the nervous system: Secondary | ICD-10-CM | POA: Insufficient documentation

## 2016-10-15 LAB — CBC
HEMATOCRIT: 40.1 % (ref 39.0–52.0)
HEMOGLOBIN: 12.9 g/dL — AB (ref 13.0–17.0)
MCH: 28.3 pg (ref 26.0–34.0)
MCHC: 32.2 g/dL (ref 30.0–36.0)
MCV: 87.9 fL (ref 78.0–100.0)
Platelets: 289 10*3/uL (ref 150–400)
RBC: 4.56 MIL/uL (ref 4.22–5.81)
RDW: 13.9 % (ref 11.5–15.5)
WBC: 11.2 10*3/uL — ABNORMAL HIGH (ref 4.0–10.5)

## 2016-10-15 NOTE — Progress Notes (Signed)
Primary Care Physician: Pcp Not In System Referring Physician: Dr. Susanne Greenhouse Sally is a 71 y.o. male with a h/o gout, obesity, right foot surgery 07/2016--PWB who was admitted on 08/30/16 with sudden onset of right arm weakness and tPA administered at [facility] prior to transfer to [facility]. On admission, patient noted to have  RUE> RLE weakness as well as facial weakness. CTA head/neck with moderate proximal L-VA stenosis and mild intracranial atherosclerosis. MRI brain reviewed, showing patchy LEFT MCA infarct with petechial hemorrhage and small RIGHT nonhemorrhagic MCA territory infarct concerning for central embolic etiology. TEE done 1/24 revealing small PFO, EF 60-65% and trivial MR. BLE dopplers negative and Loop recorder placed 1/24.    Pt is in afib clinic today for linq showing new onset afib with 54 sec of afib recorded. Dr. Caryl Comes reviewed and referred here for initiation of DOAC. Pt has been on 325 mg ASA. He was unaware of the short episode of irregular heart beat.  Returns 3/8. He looks much improved. He is currently in a rehab center run by the [place] in [place]. He feels that he is making improvement since his stroke. He has not had any issues with bleeding. Not aware of any issues with irregular heart beat.  Today, he denies symptoms of palpitations, chest pain, shortness of breath, orthopnea, PND, lower extremity edema, dizziness, presyncope, syncope, or neurologic sequela. The patient is tolerating medications without difficulties and is otherwise without complaint today.   Past Medical History:  Diagnosis Date  . Gout   . Hyperlipidemia   . Obesity    Past Surgical History:  Procedure Laterality Date  . ABDOMINAL SURGERY     colostomy & colostomy reversal  . ANKLE SURGERY    . EP IMPLANTABLE DEVICE N/A 09/02/2016   Procedure: Loop Recorder Insertion;  Surgeon: Deboraha Sprang, MD;  Location: [place] [facility];  Service: Cardiovascular;  Laterality: N/A;  . TEE WITHOUT  CARDIOVERSION N/A 09/02/2016   Procedure: TRANSESOPHAGEAL ECHOCARDIOGRAM (TEE);  Surgeon: Larey Dresser, MD;  Location: [facility] ENDOSCOPY;  Service: Cardiovascular;  Laterality: N/A;    Current Outpatient Prescriptions  Medication Sig Dispense Refill  . albuterol (PROVENTIL HFA;VENTOLIN HFA) 108 (90 BASE) MCG/ACT inhaler Inhale 2 puffs into the lungs every 6 (six) hours as needed for wheezing. 1 Inhaler 0  . apixaban (ELIQUIS) 5 MG TABS tablet Take 1 tablet (5 mg total) by mouth 2 (two) times daily. 60 tablet 0  . atorvastatin (LIPITOR) 20 MG tablet Take 1 tablet (20 mg total) by mouth daily at 6 PM. 90 tablet 0  . blood glucose meter kit and supplies KIT Dispense based on patient and insurance preference. Use up to four times daily as directed. (FOR ICD-9 250.00, 250.01). 1 each 0  . cyclobenzaprine (FLEXERIL) 5 MG tablet Take 1 tablet (5 mg total) by mouth 3 (three) times daily as needed for muscle spasms. 30 tablet 0  . gabapentin (NEURONTIN) 400 MG capsule Take 1 capsule (400 mg total) by mouth at bedtime. 90 capsule 0  . gentamicin cream (GARAMYCIN) 0.1 % Apply 1 application topically 3 (three) times daily. (Patient taking differently: Apply 1 application topically daily as needed (wound care/dressing change). ) 30 g 1  . hydrocerin (EUCERIN) CREA Apply 1 application topically daily. 454 g 0  . Menthol-Methyl Salicylate (MUSCLE RUB) 10-15 % CREA Apply 1 application topically 2 (two) times daily as needed for muscle pain. 113 g 0  . metFORMIN (GLUCOPHAGE) 500 MG tablet Take  1 tablet (500 mg total) by mouth daily with breakfast. 90 tablet 0  . traZODone (DESYREL) 100 MG tablet Take 1 tablet (100 mg total) by mouth at bedtime. 30 tablet 0   No current facility-administered medications for this encounter.     No Known Allergies  Social History   Social History  . Marital status: Single    Spouse name: N/A  . Number of children: N/A  . Years of education: N/A   Occupational History  . Not  on file.   Social History Main Topics  . Smoking status: Former Research scientist (life sciences)  . Smokeless tobacco: Never Used  . Alcohol use Yes  . Drug use: No  . Sexual activity: No   Other Topics Concern  . Not on file   Social History Narrative  . No narrative on file    Family History  Problem Relation Age of Onset  . ALS Mother   . Pancreatic cancer Father     ROS- All systems are reviewed and negative except as per the HPI above  Physical Exam: Vitals:   10/15/16 1438  BP: 136/82  Pulse: 79  Weight: 253 lb 9.6 oz (115 kg)  Height: '5\' 7"'  (1.702 m)   Wt Readings from Last 3 Encounters:  10/15/16 253 lb 9.6 oz (115 kg)  09/24/16 254 lb 9.6 oz (115.5 kg)  09/16/16 261 lb 2.2 oz (118.5 kg)    Labs: Lab Results  Component Value Date   NA 139 09/16/2016   K 4.2 09/16/2016   CL 104 09/16/2016   CO2 24 09/16/2016   GLUCOSE 128 (H) 09/16/2016   BUN 19 09/16/2016   CREATININE 1.19 09/16/2016   CALCIUM 9.2 09/16/2016   Lab Results  Component Value Date   INR 0.96 08/30/2016   Lab Results  Component Value Date   CHOL 179 08/31/2016   HDL 30 (L) 08/31/2016   LDLCALC 91 08/31/2016   TRIG 291 (H) 08/31/2016     GEN- The patient is well appearing, alert and oriented x 3 today.   Head- normocephalic, atraumatic Eyes-  Sclera clear, conjunctiva pink Ears- hearing intact Oropharynx- clear Neck- supple, no JVP Lymph- no cervical lymphadenopathy Lungs- Clear to ausculation bilaterally, normal work of breathing Heart- Regular rate and rhythm, no murmurs, rubs or gallops, PMI not laterally displaced GI- soft, NT, ND, + BS Extremities- no clubbing, cyanosis, or edema MS- no significant deformity or atrophy Skin- no rash or lesion Psych- euthymic mood, full affect Neuro- strength and sensation are intact  EKG-NSR at 79 bpm, pr int 148 ms, qrs int 80 ms, qtc 438 ms Epic records reviewed   Assessment and Plan: 1. Afib by linq in setting of recent CVA Pt denies any bleeding  history/tendencies Continue eliquis 5 mg bid for chadsvasc score of at least 5 No bleeding issues since starting drug, cbc today General education re afib, pt burden very low and asymptomatic at this point Not requiring any further treatment  F/u per [facility] Afib clinic as needed  [place], [facility] [address] [place], [place] [address] [phone]

## 2016-10-22 ENCOUNTER — Other Ambulatory Visit: Payer: Self-pay | Admitting: Internal Medicine

## 2016-10-23 LAB — CUP PACEART REMOTE DEVICE CHECK
Implantable Pulse Generator Implant Date: 20180124
MDC IDC SESS DTM: 20180223223557

## 2016-11-02 ENCOUNTER — Ambulatory Visit (INDEPENDENT_AMBULATORY_CARE_PROVIDER_SITE_OTHER): Payer: Federal, State, Local not specified - PPO | Admitting: *Deleted

## 2016-11-02 DIAGNOSIS — I639 Cerebral infarction, unspecified: Secondary | ICD-10-CM | POA: Diagnosis not present

## 2016-11-03 NOTE — Progress Notes (Signed)
Carelink Summary Report / Loop Recorder 

## 2016-11-14 LAB — CUP PACEART REMOTE DEVICE CHECK
Implantable Pulse Generator Implant Date: 20180124
MDC IDC SESS DTM: 20180325223547

## 2016-11-16 ENCOUNTER — Telehealth: Payer: Self-pay

## 2016-11-16 NOTE — Telephone Encounter (Signed)
Patient called requesting information on future procedure to be performed by Dr. Wynn Banker.  No mention of any procedure in last visit note, please advise.

## 2016-11-16 NOTE — Telephone Encounter (Signed)
I am not sure what this is in reference to.  I only saw the patient for transitional care management.  I am not sure if Dr. Wynn Banker had a discussion with the patient regarding a procedure.  Thanks.

## 2016-11-17 ENCOUNTER — Ambulatory Visit (INDEPENDENT_AMBULATORY_CARE_PROVIDER_SITE_OTHER): Payer: Federal, State, Local not specified - PPO | Admitting: Neurology

## 2016-11-17 ENCOUNTER — Encounter: Payer: Self-pay | Admitting: Neurology

## 2016-11-17 ENCOUNTER — Encounter (INDEPENDENT_AMBULATORY_CARE_PROVIDER_SITE_OTHER): Payer: Self-pay

## 2016-11-17 ENCOUNTER — Telehealth: Payer: Self-pay | Admitting: Neurology

## 2016-11-17 VITALS — BP 117/78 | HR 80 | Ht 67.0 in

## 2016-11-17 DIAGNOSIS — I63133 Cerebral infarction due to embolism of bilateral carotid arteries: Secondary | ICD-10-CM

## 2016-11-17 NOTE — Patient Instructions (Signed)
I had a long d/w patient about his recent embolic strokes and diagnosis of atrial fibrillation on prolonged cardiac monitoring, risk for recurrent stroke/TIAs, personally independently reviewed imaging studies and stroke evaluation results and answered questions.Continue Eliquis (apixaban) daily  for secondary stroke prevention and maintain strict control of hypertension with blood pressure goal below 130/90, diabetes with hemoglobin A1c goal below 6.5% and lipids with LDL cholesterol goal below 70 mg/dL. I advised him to continue schedule a home physical and occupational therapy and he talked about fall and safety precautions. He asked about driving and advise him not to drive till the boots he is wearing on his right foot for his recent foot surgery come off. I also advised the patient to eat a healthy diet with plenty of whole grains, cereals, fruits and vegetables, exercise regularly and maintain ideal body weight Followup in the future with my nurse practitioner in 3 months or call earlier if necessary. Stroke Prevention Some medical conditions and behaviors are associated with an increased chance of having a stroke. You may prevent a stroke by making healthy choices and managing medical conditions. How can I reduce my risk of having a stroke?  Stay physically active. Get at least 30 minutes of activity on most or all days.  Do not smoke. It may also be helpful to avoid exposure to secondhand smoke.  Limit alcohol use. Moderate alcohol use is considered to be:  No more than 2 drinks per day for men.  No more than 1 drink per day for nonpregnant women.  Eat healthy foods. This involves:  Eating 5 or more servings of fruits and vegetables a day.  Making dietary changes that address high blood pressure (hypertension), high cholesterol, diabetes, or obesity.  Manage your cholesterol levels.  Making food choices that are high in fiber and low in saturated fat, trans fat, and cholesterol may  control cholesterol levels.  Take any prescribed medicines to control cholesterol as directed by your health care provider.  Manage your diabetes.  Controlling your carbohydrate and sugar intake is recommended to manage diabetes.  Take any prescribed medicines to control diabetes as directed by your health care provider.  Control your hypertension.  Making food choices that are low in salt (sodium), saturated fat, trans fat, and cholesterol is recommended to manage hypertension.  Ask your health care provider if you need treatment to lower your blood pressure. Take any prescribed medicines to control hypertension as directed by your health care provider.  If you are 32-51 years of age, have your blood pressure checked every 3-5 years. If you are 55 years of age or older, have your blood pressure checked every year.  Maintain a healthy weight.  Reducing calorie intake and making food choices that are low in sodium, saturated fat, trans fat, and cholesterol are recommended to manage weight.  Stop drug abuse.  Avoid taking birth control pills.  Talk to your health care provider about the risks of taking birth control pills if you are over 65 years old, smoke, get migraines, or have ever had a blood clot.  Get evaluated for sleep disorders (sleep apnea).  Talk to your health care provider about getting a sleep evaluation if you snore a lot or have excessive sleepiness.  Take medicines only as directed by your health care provider.  For some people, aspirin or blood thinners (anticoagulants) are helpful in reducing the risk of forming abnormal blood clots that can lead to stroke. If you have the irregular heart rhythm  of atrial fibrillation, you should be on a blood thinner unless there is a good reason you cannot take them.  Understand all your medicine instructions.  Make sure that other conditions (such as anemia or atherosclerosis) are addressed. Get help right away if:  You  have sudden weakness or numbness of the face, arm, or leg, especially on one side of the body.  Your face or eyelid droops to one side.  You have sudden confusion.  You have trouble speaking (aphasia) or understanding.  You have sudden trouble seeing in one or both eyes.  You have sudden trouble walking.  You have dizziness.  You have a loss of balance or coordination.  You have a sudden, severe headache with no known cause.  You have new chest pain or an irregular heartbeat. Any of these symptoms may represent a serious problem that is an emergency. Do not wait to see if the symptoms will go away. Get medical help at once. Call your local emergency services (911 in [place] Do not drive yourself to the hospital. This information is not intended to replace advice given to you by your health care provider. Make sure you discuss any questions you have with your health care provider. Document Released: 09/03/2004 Document Revised: 01/02/2016 Document Reviewed: 01/27/2013 Elsevier Interactive Patient Education  2017 [employer].

## 2016-11-17 NOTE — Telephone Encounter (Signed)
Do not recall discussing a procedure.  Nothing listed in D/C summary .  I haven't seen pt since d/c

## 2016-11-17 NOTE — Telephone Encounter (Signed)
Patient called office in reference to our office faxing his office visit from today to Dr. Leonie Douglas.  Fax number [phone].

## 2016-11-17 NOTE — Progress Notes (Signed)
[facility] [address] [place]. [place] [address] [phone]       OFFICE FOLLOW-UP NOTE  Aaron. Aaron Moon Date of Birth:  12/28/1945 Medical Record Number:  [id number]   HPI: Aaron Moon is a 70 year male seen today for first office f/u visit after [facility] admission for stroke in January 2018. History is obtained from the patient, review of electronic medical records and I have personally reviewed imaging films.Aaron Moonis a 70 y.o.malewho was in his normal state of health until 5:30 pm on the evening of admission. He was watching TV when he had sudden onset of right hand weakness. He went to [facility] ED where a code stroke was called, and given his deficits tPA was administered prior to transfer. On arrival to [facility], he had Arm >>>leg and face weakness. LKW: 5:30 pm on 08/30/16. TPA given yes . He is admitted to the neurological intensive care unit where blood pressure was tightly controlled. He should neurological improvement. MRI scan confirmed acute patchy small to moderate left MCA territory and smaller right MCA territory infarcts. CT angiogram of the brain and neck was significant only for moderate proximal left vertebral artery stenosis which was asymptomatic. Transthoracic echo showed normal ejection fraction. Branches of echocardiogram showed normal cardia shows of embolism. Patient underwent loop recorder implant. LDL cholesterol 91 mg percent. Hemoglobin 1107. Patient has been found to have paroxysmal atrial fibrillation and loop recorder and has been now switched to eliquis. He was also started on Lipitor. Patient states his done well since discharge. He is able to walk inside his condo with a walker but he prefers to use a wheelchair for long distances. His walking is limited by his recent bilateral foot surgery and he is wearing a boot. He is complaining of some tingling and numbness in his feet on the side of the foot surgery for last several months even  prior to his strokes. He is tolerating eliquis without bleeding or bruising. He states blood pressure control is good and today it is 117/78 in office. He is tolerating Lipitor without muscle aches and pains. He states his strength is improved though he has some diminished fine motor skills.  ROS:   14 system review of systems is positive for  ringing in the ears, walking difficulty, numbness, weakness and all other systems negative PMH:  Past Medical History:  Diagnosis Date  . Arthritis   . Atrial fibrillation (HCC)   . Diabetes mellitus without complication (HCC)   . Gout   . Hyperlipidemia   . Obesity   . Stroke (HCC)     Social History:  Social History   Social History  . Marital status: Single    Spouse name: N/A  . Number of children: N/A  . Years of education: N/A   Occupational History  . Not on file.   Social History Main Topics  . Smoking status: Former Smoker  . Smokeless tobacco: Never Used  . Alcohol use No  . Drug use: No  . Sexual activity: No   Other Topics Concern  . Not on file   Social History Narrative  . No narrative on file    Medications:   Current Outpatient Prescriptions on File Prior to Visit  Medication Sig Dispense Refill  . apixaban (ELIQUIS) 5 MG TABS tablet Take 1 tablet (5 mg total) by mouth 2 (two) times daily. 60 tablet 0  . atorvastatin (LIPITOR) 20 MG tablet Take 1 tablet (20 mg total) by   mouth daily at 6 PM. 90 tablet 0  . blood glucose meter kit and supplies KIT Dispense based on patient and insurance preference. Use up to four times daily as directed. (FOR ICD-9 250.00, 250.01). 1 each 0  . hydrocerin (EUCERIN) CREA Apply 1 application topically daily. 454 g 0  . Menthol-Methyl Salicylate (MUSCLE RUB) 10-15 % CREA Apply 1 application topically 2 (two) times daily as needed for muscle pain. 113 g 0  . metFORMIN (GLUCOPHAGE) 500 MG tablet Take 1 tablet (500 mg total) by mouth daily with breakfast. 90 tablet 0  . traZODone  (DESYREL) 100 MG tablet Take 1 tablet (100 mg total) by mouth at bedtime. 30 tablet 0   No current facility-administered medications on file prior to visit.     Allergies:  No Known Allergies  Physical Exam General: Obese elderly male, seated, in no evident distress Head: head normocephalic and atraumatic.  Neck: supple with no carotid or supraclavicular bruits Cardiovascular: regular rate and rhythm, no murmurs Musculoskeletal: no deformity Skin:  no rash/petichiae Vascular:  Normal pulses all extremities but wearing bilateral boots from foot surgery Vitals:   11/17/16 1431  BP: 117/78  Pulse: 80   Neurologic Exam Mental Status: Awake and fully alert. Oriented to place and time. Recent and remote memory intact. Attention span, concentration and fund of knowledge appropriate. Mood and affect appropriate.  Cranial Nerves: Fundoscopic exam reveals sharp disc margins. Pupils equal, briskly reactive to light. Extraocular movements full without nystagmus. Visual fields full to confrontation. Hearing intact. Facial sensation intact. Face, tongue, palate moves normally and symmetrically.  Motor: Normal bulk and tone. Normal strength in all tested extremity muscles.Diminished fine finger movements on the right. Orbits left over right upper extremity. Mild right grip weakness. Sensory.: intact to touch ,pinprick .position and vibratory sensation.  Coordination: Rapid alternating movements normal in all extremities. Finger-to-nose and heel-to-shin performed accurately bilaterally. Gait and Station: Deferred as patient and recheck and is wearing bilateral boots from foot surgery Reflexes: 1+ and symmetric. Toes downgoing.   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 From newly diagnosed atrial fibrillation and loop recorder Vascular risk factors of diabetes, hypertension,  hyperlipidemia , atrial fibrillation and obesity  PLAN: I had a long d/w patient about his recent embolic strokes and diagnosis of atrial fibrillation on prolonged cardiac monitoring, risk for recurrent stroke/TIAs, personally independently reviewed imaging studies and stroke evaluation results and answered questions.Continue Eliquis (apixaban) daily  for secondary stroke prevention and maintain strict control of hypertension with blood pressure goal below 130/90, diabetes with hemoglobin A1c goal below 6.5% and lipids with LDL cholesterol goal below 70 mg/dL. I advised him to continue schedule a home physical and occupational therapy and he talked about fall and safety precautions. He asked about driving and advise him not to drive till the boots he is wearing on his right foot for his recent foot surgery come off. I also advised the patient to eat a healthy diet with plenty of whole grains, cereals, fruits and vegetables, exercise regularly and maintain ideal body weight Followup in the future with my nurse practitioner in 3 months or call earlier if necessary. Greater than 50% of time during this 25 minute visit was spent on counseling,explanation of diagnosis, planning of further management, discussion with patient and family and coordination of care Pramod Sethi, MD  [facility] [address]   [place], [place] [address]  Phone [phone] Fax [phone] Note: This document was prepared with digital dictation and possible smart phrase technology. Any transcriptional errors that result from this process are unintentional

## 2016-11-17 NOTE — Telephone Encounter (Signed)
I spoke with him, he has an appt with Dr Wynn Banker 11/17/16 and they can discuss.

## 2016-11-18 NOTE — Telephone Encounter (Signed)
RN fax office notes to [facility], pts PCP Dr. Judeth Cornfield Borum,per patient request. Fax to [phone]. Fax confirmed and receive.

## 2016-11-27 ENCOUNTER — Ambulatory Visit (HOSPITAL_BASED_OUTPATIENT_CLINIC_OR_DEPARTMENT_OTHER): Payer: Federal, State, Local not specified - PPO | Admitting: Physical Medicine & Rehabilitation

## 2016-11-27 ENCOUNTER — Encounter: Payer: Federal, State, Local not specified - PPO | Attending: Physical Medicine & Rehabilitation

## 2016-11-27 ENCOUNTER — Encounter: Payer: Self-pay | Admitting: Physical Medicine & Rehabilitation

## 2016-11-27 ENCOUNTER — Ambulatory Visit: Payer: Federal, State, Local not specified - PPO

## 2016-11-27 VITALS — BP 124/78 | HR 79

## 2016-11-27 DIAGNOSIS — I69393 Ataxia following cerebral infarction: Secondary | ICD-10-CM | POA: Insufficient documentation

## 2016-11-27 DIAGNOSIS — E785 Hyperlipidemia, unspecified: Secondary | ICD-10-CM | POA: Insufficient documentation

## 2016-11-27 DIAGNOSIS — R269 Unspecified abnormalities of gait and mobility: Secondary | ICD-10-CM

## 2016-11-27 DIAGNOSIS — I69392 Facial weakness following cerebral infarction: Secondary | ICD-10-CM | POA: Diagnosis not present

## 2016-11-27 DIAGNOSIS — I69398 Other sequelae of cerebral infarction: Secondary | ICD-10-CM | POA: Diagnosis not present

## 2016-11-27 DIAGNOSIS — M109 Gout, unspecified: Secondary | ICD-10-CM | POA: Insufficient documentation

## 2016-11-27 DIAGNOSIS — Z820 Family history of epilepsy and other diseases of the nervous system: Secondary | ICD-10-CM | POA: Diagnosis not present

## 2016-11-27 DIAGNOSIS — E1159 Type 2 diabetes mellitus with other circulatory complications: Secondary | ICD-10-CM | POA: Insufficient documentation

## 2016-11-27 DIAGNOSIS — I6930 Unspecified sequelae of cerebral infarction: Secondary | ICD-10-CM | POA: Diagnosis present

## 2016-11-27 DIAGNOSIS — Z87891 Personal history of nicotine dependence: Secondary | ICD-10-CM | POA: Insufficient documentation

## 2016-11-27 DIAGNOSIS — G479 Sleep disorder, unspecified: Secondary | ICD-10-CM | POA: Diagnosis not present

## 2016-11-27 DIAGNOSIS — Z9889 Other specified postprocedural states: Secondary | ICD-10-CM | POA: Diagnosis not present

## 2016-11-27 DIAGNOSIS — Z933 Colostomy status: Secondary | ICD-10-CM | POA: Insufficient documentation

## 2016-11-27 DIAGNOSIS — I69351 Hemiplegia and hemiparesis following cerebral infarction affecting right dominant side: Secondary | ICD-10-CM | POA: Diagnosis not present

## 2016-11-27 DIAGNOSIS — Z800 Family history of malignant neoplasm of digestive organs: Secondary | ICD-10-CM | POA: Diagnosis not present

## 2016-11-27 DIAGNOSIS — I10 Essential (primary) hypertension: Secondary | ICD-10-CM | POA: Diagnosis not present

## 2016-11-27 NOTE — Progress Notes (Signed)
Subjective:    Patient ID: Aaron Moon, male    DOB: 1946-02-23, 71 y.o.   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 General activity 0 Relation with others 0 Enjoyment of life 0 What TIME of day is your pain at its worst? na Sleep (in general) Fair  Pain is worse with: na Pain improves with: injections and na Relief from Meds: na  Mobility walk with assistance use a walker ability to climb steps?  no do you drive?  no use a wheelchair needs help with transfers  Function not employed: date last employed . I need assistance with the following:  feeding, meal prep, household duties and shopping  Neuro/Psych weakness numbness trouble walking  Prior Studies Any changes since last visit?  no  Physicians involved in your care Any changes since last visit?  no   Family History  Problem Relation Age of Onset  . ALS Mother   . Pancreatic cancer Father    Social History   Social History  . Marital status: Single    Spouse name: N/A  . Number of children: N/A  . Years of education: N/A   Social History Main Topics  . Smoking status: Former Games developer  . Smokeless tobacco: Never Used  . Alcohol use No  . Drug use: No  . Sexual activity: No   Other Topics Concern  . None   Social History Narrative  . None   Past Surgical History:  Procedure Laterality Date  . ABDOMINAL SURGERY     colostomy & colostomy reversal  . ANKLE SURGERY    . EP IMPLANTABLE DEVICE N/A 09/02/2016   Procedure: Loop Recorder Insertion;  Surgeon: Duke Salvia, MD;  Location: [facility] INVASIVE CV LAB;  Service: Cardiovascular;  Laterality: N/A;  . TEE  WITHOUT CARDIOVERSION N/A 09/02/2016   Procedure: TRANSESOPHAGEAL ECHOCARDIOGRAM (TEE);  Surgeon: Laurey Morale, MD;  Location: [facility] ENDOSCOPY;  Service: Cardiovascular;  Laterality: N/A;   Past Medical History:  Diagnosis Date  . Arthritis   . Atrial fibrillation (HCC)   . Diabetes mellitus without complication (HCC)   . Gout   . Hyperlipidemia   . Obesity   . Stroke (HCC)    BP 124/78   Pulse 79   SpO2 98%   Opioid Risk Score:   Fall Risk Score:  `1  Depression screen PHQ 2/9  Depression screen [facility] 2/9 09/30/2016 07/22/2015  Decreased Interest 0 0  Down, Depressed, Hopeless 0 0  PHQ - 2 Score 0 0  Altered sleeping 0 -  Tired, decreased energy 0 -  Change in appetite 0 -  Feeling bad or failure about yourself  0 -  Trouble concentrating 0 -  Moving slowly or fidgety/restless 0 -  Suicidal thoughts 0 -  PHQ-9 Score 0 -  Difficult doing work/chores Not difficult at all -     Review of Systems  Constitutional: Positive for unexpected weight change.  HENT: Negative.   Eyes: Negative.   Respiratory: Negative.   Cardiovascular: Negative.   Gastrointestinal: Negative.   Endocrine: Negative.   Genitourinary: Negative.   Musculoskeletal: Positive  for joint swelling.  Skin: Negative.   Allergic/Immunologic: Negative.   Neurological: Negative.   Hematological: Negative.   Psychiatric/Behavioral: Negative.   All other systems reviewed and are negative.      Objective:   Physical Exam  Constitutional: He is oriented to person, place, and time. He appears well-developed and well-nourished.  HENT:  Head: Normocephalic and atraumatic.  Eyes: Conjunctivae and EOM are normal. Pupils are equal, round, and reactive to light.  Neck: Normal range of motion.  Neurological: He is alert and oriented to person, place, and time. Coordination and gait abnormal.  Wide based gait , short step length,  Decreased finger to thumb opposition in R hand  Psychiatric: He has a normal  mood and affect.  Nursing note and vitals reviewed.  Visual fields are intact to confrontation testing       Assessment & Plan:  1.  Left MCA infarct overall good recovery but has residual fine motor deficits , has not had much PT, OT with HH Will make referral to OP PT, OT  Feel pt ready to return to driving  Graduated return to driving instructions were provided. It is recommended that the patient first drives with another licensed driver in an empty parking lot. If the patient does well with this, and they can drive on a quiet street with the licensed driver. If the patient does well with this they can drive on a busy street with a licensed driver. If the patient does well with this, the next time out they can go by himself. For the first month after resuming driving, I recommend no nighttime or Interstate driving.   RTC 1 mo

## 2016-11-27 NOTE — Patient Instructions (Signed)

## 2016-12-01 ENCOUNTER — Ambulatory Visit (INDEPENDENT_AMBULATORY_CARE_PROVIDER_SITE_OTHER): Payer: Federal, State, Local not specified - PPO | Admitting: *Deleted

## 2016-12-01 DIAGNOSIS — I639 Cerebral infarction, unspecified: Secondary | ICD-10-CM

## 2016-12-01 NOTE — Patient Outreach (Signed)
[facility]) Care Management  12/01/2016  Aaron Moon March 27, 1946 [id number]   mRS successfully obtained by Dr. Pearlean Brownie on 11/17/2016. mRS = 3

## 2016-12-02 NOTE — Progress Notes (Signed)
Carelink Summary Report / Loop Recorder 

## 2016-12-08 ENCOUNTER — Ambulatory Visit
Payer: Federal, State, Local not specified - PPO | Attending: Physical Medicine & Rehabilitation | Admitting: Physical Therapy

## 2016-12-08 ENCOUNTER — Encounter: Payer: Self-pay | Admitting: Physical Therapy

## 2016-12-08 ENCOUNTER — Ambulatory Visit: Payer: Federal, State, Local not specified - PPO | Admitting: Occupational Therapy

## 2016-12-08 VITALS — BP 138/97 | HR 80

## 2016-12-08 DIAGNOSIS — R2689 Other abnormalities of gait and mobility: Secondary | ICD-10-CM

## 2016-12-08 DIAGNOSIS — R278 Other lack of coordination: Secondary | ICD-10-CM

## 2016-12-08 DIAGNOSIS — M6281 Muscle weakness (generalized): Secondary | ICD-10-CM | POA: Insufficient documentation

## 2016-12-08 DIAGNOSIS — R41840 Attention and concentration deficit: Secondary | ICD-10-CM

## 2016-12-08 DIAGNOSIS — R2681 Unsteadiness on feet: Secondary | ICD-10-CM | POA: Insufficient documentation

## 2016-12-08 DIAGNOSIS — I69351 Hemiplegia and hemiparesis following cerebral infarction affecting right dominant side: Secondary | ICD-10-CM | POA: Insufficient documentation

## 2016-12-08 NOTE — Therapy (Signed)
[facility] [address] [place], [place], [address] Phone: [phone]   Fax:  [phone]  Physical Therapy Evaluation  Patient Details  Name: Aaron Moon MRN: [id number] Date of Birth: Apr 15, 1946 Referring Provider: Claudette Laws MD   Encounter Date: 12/08/2016      PT End of Session - 12/08/16 1529    Visit Number 1   Number of Visits 18   Date for PT Re-Evaluation 02/05/17   Authorization Type Federal [employer]    PT Start Time 1015   PT Stop Time 1104   PT Time Calculation (min) 49 min   Equipment Utilized During Treatment Gait belt   Activity Tolerance Patient tolerated treatment well   Behavior During Therapy [facility] for tasks assessed/performed      Past Medical History:  Diagnosis Date  . Arthritis   . Atrial fibrillation (HCC)   . Diabetes mellitus without complication (HCC)   . Gout   . Hyperlipidemia   . Obesity   . Stroke [facility])     Past Surgical History:  Procedure Laterality Date  . ABDOMINAL SURGERY     colostomy & colostomy reversal  . ANKLE SURGERY    . EP IMPLANTABLE DEVICE N/A 09/02/2016   Procedure: Loop Recorder Insertion;  Surgeon: Duke Salvia, MD;  Location: [facility] INVASIVE CV LAB;  Service: Cardiovascular;  Laterality: N/A;  . TEE WITHOUT CARDIOVERSION N/A 09/02/2016   Procedure: TRANSESOPHAGEAL ECHOCARDIOGRAM (TEE);  Surgeon: Laurey Morale, MD;  Location: [facility] ENDOSCOPY;  Service: Cardiovascular;  Laterality: N/A;    Vitals:   12/08/16 1047  BP: (!) 138/97  Pulse: 80         Subjective Assessment - 12/08/16 1021    Subjective The patient is a 71 year old male presenting to PT s/p L MCA infarct (08/30/2016). He was in acute care 08/30/2016 - 09/02/2016, followed by CIR from 09/02/2016 - 09/17/2016. Patient reports wanting to return to walking with ease in the community (errands, attending plays, the park). Patient reports his foot is giving him the most trouble right now due to  neuropathy. Patient also reports that he is easily fatigued and lacks endurance relative to his function prior to the stroke. The patient had bunion correction surgery in November of 2017, and has not been able to return to work since that surgery due to the CVA shortly thereafter. 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 The patient reports he is hopeful he will be able to return to work full time soon, and the majority of his work day is spent seated.   Limitations Lifting;Standing;Walking;House hold activities   Patient Stated Goals to walk better/with more ease    Currently in Pain? Yes   Pain Score 2   worst: 3/10; lowest 0/10   Pain Location Foot   Pain Orientation Right;Left   Pain Descriptors / Indicators Tingling  "neuropathy pain"    Pain Type Neuropathic pain   Aggravating Factors  unknown    Pain Relieving Factors elevation at night             [facility] PT Assessment - 12/08/16 1015      Assessment   Medical Diagnosis L MCA infarct    Referring Provider Claudette Laws MD    Onset Date/Surgical Date 11/27/16  PT referral    Hand Dominance Right   Prior Therapy CIR following CVA (08/30/2016)      Precautions  Precautions Fall     Balance Screen   Has the patient fallen in the past 6 months No   Has the patient had a decrease in activity level because of a fear of falling?  Yes   Is the patient reluctant to leave their home because of a fear of falling?  No     Home Environment   Living Environment Private residence   Living Arrangements Alone   Type of Home Other(Comment)  condo    Home Access Other (comment)  one curb to get from car into front door; no rail   Home Layout One level   Home Equipment Walker - 2 wheels;Tub bench;Grab bars - toilet     Prior Function   Level of Independence Independent   Vocation Full time employment;On disability   Vocation Requirements Patient reports he  has not been working since November (due to bunion surgery), but once he is cleared from the MD, he would like to return to work. Patient is seated at work.    Leisure going to movies and plays, going to the park and rowing boats at the park      Cognition   Memory Impaired   Memory Impairment Other (comment)  reported he has noticed he can't "remember as easily"      ROM / Strength   AROM / PROM / Strength Strength     Strength   Strength Assessment Site Hip;Knee;Ankle   Right/Left Hip Right;Left   Right Hip Flexion 4/5   Left Hip Flexion 5/5   Right/Left Knee Right;Left   Right Knee Extension 5/5   Left Knee Extension 5/5   Right/Left Ankle Right;Left   Right Ankle Dorsiflexion 3-/5   Left Ankle Dorsiflexion 5/5     Transfers   Transfers Sit to Stand;Stand to Sit;Stand Pivot Transfers   Sit to Stand 5: Supervision   Sit to Stand Details (indicate cue type and reason) patient able to perform sit to stand transfer without use of UEs, but is not equally weight shifted on LEs (LLE weight bearing > RLE weight bearing)   Stand to Sit 5: Supervision   Stand to Sit Details patient is able to perform stand to sit transfer without UE support, but does not bear weight equally between his LEs (increased weight bearing on LLE relative to RLE)    Stand Pivot Transfers 5: Supervision   Stand Pivot Transfer Details (indicate cue type and reason) patient is able to perform transfer without UE support, but does not equally bear weight in LEs (LLE bears more weight than RLE)     Ambulation/Gait   Ambulation/Gait Yes   Ambulation/Gait Assistance 5: Supervision   Ambulation Distance (Feet) 351 Feet  assessed in 3 minute walk test    Assistive device None   Gait Pattern Step-to pattern;Decreased arm swing - right;Decreased step length - left;Decreased stance time - right;Decreased stride length;Decreased dorsiflexion - right;Decreased weight shift to right;Trendelenburg;Trunk flexed;Abducted -  left;Poor foot clearance - right   Ambulation Surface Level;Indoor   Gait velocity 1.74ft/s     6 Minute Walk- Baseline   6 Minute Walk- Baseline yes  3 minute walk test    BP (mmHg) (!)  138/97   HR (bpm) 80   Modified Borg Scale for Dyspnea 0- Nothing at all     6 Minute walk- Post Test   6 Minute Walk Post Test yes  3 minute walk test    BP (mmHg) (!)  150/105   HR (  bpm) 91   Modified Borg Scale for Dyspnea 4- somewhat severe  RR = 24 breaths/min      6 minute walk test results    Aerobic Endurance Distance Walked 351  3 minute walk test   Endurance additional comments Patient reported a noted decrease in his endurance and increase in fatigue with activity. PT performed a 3 minute walk test with plans to perform 6 minute walk test in future as patient's endurance increases.     Standardized Balance Assessment   Standardized Balance Assessment Berg Balance Test;Timed Up and Go Test     Berg Balance Test   Sit to Stand Able to stand without using hands and stabilize independently   Standing Unsupported Able to stand safely 2 minutes   Sitting with Back Unsupported but Feet Supported on Floor or Stool Able to sit safely and securely 2 minutes   Stand to Sit Sits safely with minimal use of hands   Transfers Able to transfer safely, minor use of hands   Standing Unsupported with Eyes Closed Able to stand 10 seconds safely   Standing Ubsupported with Feet Together Able to place feet together independently and stand 1 minute safely   From Standing, Reach Forward with Outstretched Arm Can reach forward >12 cm safely (5")   From Standing Position, Pick up Object from Floor Able to pick up shoe, needs supervision   From Standing Position, Turn to Look Behind Over each Shoulder Looks behind from both sides and weight shifts well   Turn 360 Degrees Able to turn 360 degrees safely but slowly   Standing Unsupported, Alternately Place Feet on Step/Stool Able to complete 4 steps without aid  or supervision   Standing Unsupported, One Foot in Front Able to plae foot ahead of the other independently and hold 30 seconds   Standing on One Leg Tries to lift leg/unable to hold 3 seconds but remains standing independently   Total Score 46     Timed Up and Go Test   Normal TUG (seconds) 15.37                           PT Education - 12/08/16 1528    Education provided Yes   Education Details plan of care    Person(s) Educated Patient   Methods Explanation   Comprehension Verbalized understanding          PT Short Term Goals - 12/08/16 1550      PT SHORT TERM GOAL #1   Title Patient will verbalize understanding of initial HEP. (TARGET DATE: 01/08/2017)   Time 4   Period Weeks   Status New     PT SHORT TERM GOAL #2   Title Patient will have a TUG score </=13.5 to indicate a decrease in his risk of falling. (TARGET DATE: 01/08/2017)    Time 4   Period Weeks   Status New     PT SHORT TERM GOAL #3   Title Patient will demonstrate ability to stand on LLE without UE support for >/=5 seconds to indicate a decrease in his risk of falling. (TARGET DATE: 01/08/2017)    Time 4   Period Weeks   Status New     PT SHORT TERM GOAL #4   Title Patient will demonstrate ability to ascend/descend 4 stairs with unilateral UE support to indicate a decrease in his risk of falling when ambulating on stairs in the community. (TARGET DATE: 01/08/2017)  Time 4   Period Weeks   Status New           PT Long Term Goals - 12/08/16 1543      PT LONG TERM GOAL #1   Title Patient will verbalize understanding of onogoing HEP. (TARGET DATE: 02/05/2017)    Time 8   Period Weeks   Status New     PT LONG TERM GOAL #2   Title PT will perform 6 minute walk test (without an assistive device) and goal will be set to indicate an increase in patient's endurance. (TARGET DATE: 02/05/2017)    Time 8   Period Weeks   Status New     PT LONG TERM GOAL #3   Title PT will perform FGA  (without an assistive device) and goal will be set to indicate a decrease in the patient's risk of falling. (TARGET DATE: 02/05/2017)    Time 8   Period Weeks   Status New     PT LONG TERM GOAL #4   Title Patient will have a gait velocity of >/= 2.89ft/s without an assistive device to indicate a community ambulator. (TARGET DATE: 02/05/2017)    Time 8   Period Weeks   Status New     PT LONG TERM GOAL #5   Title Patient will demonstrate ability to independently ambulate 500 feet outdoors (including ramps/curbs/grass) without an assistive device to indicate a decrease in his risk of falling when out in the community. (TARGET DATE: 02/05/2017)    Time 8   Period Weeks   Status New     Additional Long Term Goals   Additional Long Term Goals Yes     PT LONG TERM GOAL #6   Title Patient will score >/= 52/56 on the Berg Balance Test to indicate a decrease in his risk of falling. (TARGET DATE: 02/05/2017)    Time 8   Period Weeks   Status New               Plan - 12/08/16 1530    Clinical Impression Statement The patient is a 71 year old male presenting to PT s/p L MCA infarct on 08/30/2016. Patient has a past medical history that is significant for colostomy and reversal, gout, obesity, arthritis, atrial fibrillation, DM type 2, and hyperlipidemia. The patient was in acute care from 08/30/2016 - 09/02/2016, and then transferred to CIR from 09/02/2016 - 09/17/2016. The patient now lives alone at home (condo). Prior to the patient's CVA (08/30/2016), the patient had bunion surgery in November of 2017. Prior to his foot surgery, the patient was fully independent and working full time with [employer] as a Solicitor. The patient reports the majority of his job duties can be completed from a seated position. The patient reports he would like to return to working full time. Due to patient reports of reduced endurance and fatigue, PT performed a 3 minute walk test, and the patient ambulated 363ft with  associated elevation in BP (150/105). The patient's TUG score = 15.37s indicating he is at high risk to fall. His gait velocity =1.74ft/s, indicating he is a limited community ambulator. The patient has noted weakness in RLE as noted by his MMT scores, which negatively impact his mobility and balance. The patient's case is evolving and is of moderate complexity. Patient will benefit from skilled PT to address mobility and balance deficits outlined in this note.    Rehab Potential Good   Clinical Impairments Affecting Rehab Potential mild  cognitive and memory deficits, bunion surgery, arthritis, atrial fibrillation, DM type 2, gout, obesity, colostomy & reversal, hyperlipidemia    PT Frequency 2x / week   PT Duration 8 weeks   PT Treatment/Interventions ADLs/Self Care Home Management;Electrical Stimulation;Ultrasound;Cognitive remediation;Neuromuscular re-education;Balance training;Therapeutic exercise;Therapeutic activities;Functional mobility training;Stair training;Gait training;DME Instruction;Patient/family education;Manual techniques;Passive range of motion   PT Next Visit Plan Perform Functional Gait Assessment & set LTG, initiate HEP    Consulted and Agree with Plan of Care Patient      Patient will benefit from skilled therapeutic intervention in order to improve the following deficits and impairments:  Abnormal gait, Cardiopulmonary status limiting activity, Decreased activity tolerance, Decreased balance, Decreased cognition, Decreased coordination, Decreased safety awareness, Decreased range of motion, Decreased mobility, Decreased knowledge of use of DME, Decreased endurance, Decreased strength, Difficulty walking, Impaired flexibility, Impaired sensation, Postural dysfunction, Pain  Visit Diagnosis: Other abnormalities of gait and mobility  Unsteadiness on feet  Muscle weakness (generalized)  Hemiplegia and hemiparesis following cerebral infarction affecting right dominant side  (HCC)      G-Codes - 12-22-16 1600    Functional Assessment Tool Used (Outpatient Only) TUG = 15.37s; Berg Balance Test = 46/56    Functional Limitation Mobility: Walking and moving around   Mobility: Walking and Moving Around Current Status ([id number]) At least 40 percent but less than 60 percent impaired, limited or restricted   Mobility: Walking and Moving Around Goal Status [phone]) At least 1 percent but less than 20 percent impaired, limited or restricted       Problem List Patient Active Problem List   Diagnosis Date Noted  . Late effect of cerebrovascular accident (CVA) 09/30/2016  . Paroxysmal atrial fibrillation (HCC) 09/23/2016  . Status post placement of implantable loop recorder   . Acute blood loss anemia   . Hypoalbuminemia due to protein-calorie malnutrition (HCC)   . Labile blood pressure   . Acute ischemic left MCA stroke (HCC) 09/02/2016  . Ataxia, post-stroke   . Adjustment disorder with mixed anxiety and depressed mood   . Status post right foot surgery   . History of gout   . Primary insomnia   . Acute ischemic stroke (HCC)   . Idiopathic gout   . Morbid obesity (HCC)   . Diastolic dysfunction   . Benign essential HTN   . Diabetes mellitus (HCC)   . Leukocytosis   . CVA (cerebral vascular accident) (HCC) 08/30/2016  . Stroke (cerebrum) (HCC) 08/30/2016  . 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 Hammertoe of right foot 07/20/2016  . Hallux valgus, acquired, bilateral 02/19/2014  . Gout 02/13/2013  . Hyperlipidemia 01/18/2012  . Obesity 01/18/2012        Vladimir Faster, SPT 12/09/2016, 6:07 AM  [facility] [address] [place], [place], [address] Phone: [phone]   Fax:  [phone]  Name: Aaron Moon MRN: [id number] Date of Birth: March 19, 1946      G-Codes - 12-22-16 1600    Functional Assessment Tool Used (Outpatient Only) TUG = 15.37s; Sharlene Motts Balance Test = 46/56     Functional Limitation Mobility: Walking and moving around   Mobility: Walking and Moving Around Current Status [phone]) At least 40 percent but less than 60 percent impaired, limited or restricted   Mobility: Walking and Moving Around Goal Status [phone]) At least 1 percent but less than 20 percent impaired, limited or restricted     Vladimir Faster, PT, DPT PT Specializing in Prosthetics & Orthotics 12/09/16 6:07 AM Phone:  (  [phone]  Fax:  [phone] [facility] [address] [place], [place] [address]

## 2016-12-08 NOTE — Therapy (Signed)
[facility] [address] [place], [place], [address] Phone: [phone]   Fax:  [phone]  Occupational Therapy Evaluation  Patient Details  Name: Aaron Moon MRN: [id number] Date of Birth: 1946/01/25 Referring Provider: Dr. Claudette Laws  Encounter Date: 12/08/2016      OT End of Session - 12/08/16 1237    Visit Number 1   Number of Visits 17   Date for OT Re-Evaluation 02/05/17   Authorization Type Federal BC/BS, [place] - G code needed   Authorization - Visit Number 1   Authorization - Number of Visits 10   OT Start Time 1105   OT Stop Time 1148   OT Time Calculation (min) 43 min   Activity Tolerance Patient tolerated treatment well      Past Medical History:  Diagnosis Date  . Arthritis   . Atrial fibrillation (HCC)   . Diabetes mellitus without complication (HCC)   . Gout   . Hyperlipidemia   . Obesity   . Stroke [facility])     Past Surgical History:  Procedure Laterality Date  . ABDOMINAL SURGERY     colostomy & colostomy reversal  . ANKLE SURGERY    . EP IMPLANTABLE DEVICE N/A 09/02/2016   Procedure: Loop Recorder Insertion;  Surgeon: Duke Salvia, MD;  Location: [facility] INVASIVE CV LAB;  Service: Cardiovascular;  Laterality: N/A;  . TEE WITHOUT CARDIOVERSION N/A 09/02/2016   Procedure: TRANSESOPHAGEAL ECHOCARDIOGRAM (TEE);  Surgeon: Laurey Morale, MD;  Location: [facility] ENDOSCOPY;  Service: Cardiovascular;  Laterality: N/A;    There were no vitals filed for this visit.      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 Yes   Pain Score 2    Pain Location Foot  O.T. not addressing - outside scope of practice   Pain Orientation Right;Left           [facility] OT Assessment - 12/08/16 1107      Assessment   Diagnosis Lt CVA  Rt hemiparesis   Referring Provider Dr. Claudette Laws   Onset Date 08/30/16   Prior Therapy inpatient rehab, HHOT/PT     Precautions   Precautions Fall   Precaution Comments Internal loop recorder     Balance Screen   Has the patient fallen in the past 6 months No   Has the patient had a decrease in activity level because of a fear of falling?  No   Is the patient reluctant to leave their home because of a fear of falling?  No     Home  Environment   Educational psychologist;Door   Additional Comments Lives in 1 story condo, no steps to enter. DME: walker, reacher   Lives With Alone     Prior Function   Level of Independence Independent   Vocation Full time employment;On disability   Vocation Requirements Patient reports he has not been working since November (due to bunion surgery), but once he is cleared from the MD, he would like to return to work. Patient is seated at work.    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   Light Housekeeping Performs light daily tasks such as dishwashing, bed making;Does personal laundry completely  Pt calls friend for heavier cleaning   Meal Prep Plans, prepares and serves adequate meals independently  eats out a lot   Training and development officer own vehicle   Medication Management Has difficulty remembering to take medication  reports occasionally forgetting and taking twice     Mobility   Mobility Status Independent   Mobility Status Comments uses walker for long distances     Written Expression   Dominant Hand Right   Handwriting 75% legible  noted mild  expressive aphasia     Vision - History   Baseline Vision Wears glasses only for reading   Visual History Cataracts     Vision Assessment   Comment denies change from CVA     Activity Tolerance   Activity Tolerance --  Fatigues w/ BP increase during activity (3 min. walk test)      Cognition   Overall Cognitive Status Impaired/Different from baseline     Observation/Other Assessments   Observations spells "world" backwards w/ 1 error (switching r and o). Mental subtractions by 7's w/ mod errors (but reports he never was great at math). Delayed recall: 3/3. Pt reports incr. difficulty with concentration especially in busy environment     Sensation   Additional Comments denies change     Coordination   9 Hole Peg Test Right;Left   Right 9 Hole Peg Test 36.56 sec   Left 9 Hole Peg Test 31.19 sec   Box and Blocks Rt = 39 lbs, Lt = 49 lbs     Edema   Edema none     Tone   Assessment Location --  none     ROM / Strength   AROM / PROM / Strength AROM;Strength     AROM   Overall AROM Comments BUE AROM WNL's but weakness RUE w/ mild compensations in high range sh. flexion     Strength   Overall Strength Comments LUE grossly 5/5, RUE grossly 5/5 however fatigues quickly     Hand Function   Right Hand Grip (lbs) 52 lbs   Left Hand Grip (lbs) 55 lbs                           OT Short Term Goals - 12/08/16 1241      OT SHORT TERM GOAL #1   Title Independent with coordination and putty HEP (Due 01/08/17)   Time 4   Period Weeks   Status New     OT SHORT TERM GOAL #2   Title Pt to improve functional use of RUE as evidenced by performing 45 blocks on Blocks & Box test   Baseline eval = 39 (Lt = 49)    Time 4   Period Weeks   Status New     OT SHORT TERM GOAL #3   Title Pt to write paragraph maintaining 90% or greater legibility   Baseline eval: sentence at 75% legibility   Time 4   Period Weeks   Status New           OT Long Term Goals -  12/08/16 1244  OT LONG TERM GOAL #1   Title Independent with RUE strengthening HEP for strength/endurance (due by 02/05/17)    Time --  6-8 weeks for all LTG's   Status New     OT LONG TERM GOAL #2   Title Improve coordination Rt hand as evidenced by performing 9 hole peg test in 30 sec. or less   Baseline eval = 36.56 sec   Status New     OT LONG TERM GOAL #3   Title Pt to verbalize understanding of memory compensatory strategies and how to implement at home/work   Status New               Plan - 12-22-2016 1238    Clinical Impression Statement Pt is a 71 y.o. male who presents to outpatient rehab s/p Lt CVA on 08/30/16 with mild RUE deficits in coordination, strength/endurance, and decr. cognition. Pt would benefit from skilled OT to maximize Rt dominant side function for functional tasks, as well as prep for return to work   Rehab Potential Good   OT Frequency 2x / week   OT Duration 8 weeks  plus eval (anticipate only 4-6 weeks needed)   OT Treatment/Interventions Self-care/ADL training;DME and/or AE instruction;Patient/family education;Therapeutic exercises;Therapeutic activities;Neuromuscular education;Functional Mobility Training;Passive range of motion;Cognitive remediation/compensation;Visual/perceptual remediation/compensation;Manual Therapy   Plan coordination and putty HEP    Consulted and Agree with Plan of Care Patient      Patient will benefit from skilled therapeutic intervention in order to improve the following deficits and impairments:  Decreased coordination, Decreased endurance, Decreased safety awareness, Decreased activity tolerance, Decreased knowledge of precautions, Impaired UE functional use, Decreased mobility, Decreased strength, Decreased cognition, Impaired perceived functional ability  Visit Diagnosis: Other lack of coordination - Plan: Ot plan of care cert/re-cert  Muscle weakness (generalized) - Plan: Ot plan of care  cert/re-cert  Attention and concentration deficit - Plan: Ot plan of care cert/re-cert      G-Codes - 12/22/16 1249    Functional Assessment Tool Used (Outpatient only) RUE: 9 hole peg test = 36.56 sec. Box & Blocks = 39    Functional Limitation Carrying, moving and handling objects   Carrying, Moving and Handling Objects Current Status [phone]) At least 20 percent but less than 40 percent impaired, limited or restricted   Carrying, Moving and Handling Objects Goal Status ([id number]) At least 1 percent but less than 20 percent impaired, limited or restricted      Problem List Patient Active Problem List   Diagnosis Date Noted  . Late effect of cerebrovascular accident (CVA) 09/30/2016  . Paroxysmal atrial fibrillation (HCC) 09/23/2016  . Status post placement of implantable loop recorder   . Acute blood loss anemia   . Hypoalbuminemia due to protein-calorie malnutrition (HCC)   . Labile blood pressure   . Acute ischemic left MCA stroke (HCC) 09/02/2016  . Ataxia, post-stroke   . Adjustment disorder with mixed anxiety and depressed mood   . Status post right foot surgery   . History of gout   . Primary insomnia   . Acute ischemic stroke (HCC)   . Idiopathic gout   . Morbid obesity (HCC)   . Diastolic dysfunction   . Benign essential HTN   . Diabetes mellitus (HCC)   . Leukocytosis   . CVA (cerebral vascular accident) (HCC) 08/30/2016  . Stroke (cerebrum) (HCC) 08/30/2016  . 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 Hammertoe of right foot 07/20/2016  . Hallux valgus, acquired, bilateral 02/19/2014  .  Gout 02/13/2013  . Hyperlipidemia 01/18/2012  . Obesity 01/18/2012    Kelli Churn, OTR/L 12/08/2016, 12:53 PM  [facility] [address] [place], [place], [address] Phone: [phone]   Fax:  [phone]  Name: Aaron Moon MRN: [id number] Date of Birth: 1946-06-03

## 2016-12-09 NOTE — Therapy (Deleted)
[facility] [address] [place], [place], [address] Phone: [phone]   Fax:  [phone]  Physical Therapy Evaluation  Patient Details  Name: Aaron Moon MRN: [id number] Date of Birth: Apr 15, 1946 Referring Provider: Claudette Laws MD   Encounter Date: 12/08/2016      PT End of Session - 12/08/16 1529    Visit Number 1   Number of Visits 18   Date for PT Re-Evaluation 02/05/17   Authorization Type Federal BCBS    PT Start Time 1015   PT Stop Time 1104   PT Time Calculation (min) 49 min   Equipment Utilized During Treatment Gait belt   Activity Tolerance Patient tolerated treatment well   Behavior During Therapy [facility] for tasks assessed/performed      Past Medical History:  Diagnosis Date  . Arthritis   . Atrial fibrillation (HCC)   . Diabetes mellitus without complication (HCC)   . Gout   . Hyperlipidemia   . Obesity   . Stroke [facility])     Past Surgical History:  Procedure Laterality Date  . ABDOMINAL SURGERY     colostomy & colostomy reversal  . ANKLE SURGERY    . EP IMPLANTABLE DEVICE N/A 09/02/2016   Procedure: Loop Recorder Insertion;  Surgeon: Duke Salvia, MD;  Location: [facility] INVASIVE CV LAB;  Service: Cardiovascular;  Laterality: N/A;  . TEE WITHOUT CARDIOVERSION N/A 09/02/2016   Procedure: TRANSESOPHAGEAL ECHOCARDIOGRAM (TEE);  Surgeon: Laurey Morale, MD;  Location: [facility] ENDOSCOPY;  Service: Cardiovascular;  Laterality: N/A;    Vitals:   12/08/16 1047  BP: (!) 138/97  Pulse: 80         Subjective Assessment - 12/08/16 1021    Subjective The patient is a 71 year old male presenting to PT s/p L MCA infarct (08/30/2016). He was in acute care 08/30/2016 - 09/02/2016, followed by CIR from 09/02/2016 - 09/17/2016. Patient reports wanting to return to walking with ease in the community (errands, attending plays, the park). Patient reports his foot is giving him the most trouble right now due to  neuropathy. Patient also reports that he is easily fatigued and lacks endurance relative to his function prior to the stroke. The patient had bunion correction surgery in November of 2017, and has not been able to return to work since that surgery due to the CVA shortly thereafter. 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 The patient reports he is hopeful he will be able to return to work full time soon, and the majority of his work day is spent seated.   Limitations Lifting;Standing;Walking;House hold activities   Patient Stated Goals to walk better/with more ease    Currently in Pain? Yes   Pain Score 2   worst: 3/10; lowest 0/10   Pain Location Foot   Pain Orientation Right;Left   Pain Descriptors / Indicators Tingling  "neuropathy pain"    Pain Type Neuropathic pain   Aggravating Factors  unknown    Pain Relieving Factors elevation at night             [facility] PT Assessment - 12/08/16 1015      Assessment   Medical Diagnosis L MCA infarct    Referring Provider Claudette Laws MD    Onset Date/Surgical Date 11/27/16  PT referral    Hand Dominance Right   Prior Therapy CIR following CVA (08/30/2016)      Precautions  Precautions Fall     Balance Screen   Has the patient fallen in the past 6 months No   Has the patient had a decrease in activity level because of a fear of falling?  Yes   Is the patient reluctant to leave their home because of a fear of falling?  No     Home Environment   Living Environment Private residence   Living Arrangements Alone   Type of Home Other(Comment)  condo    Home Access Other (comment)  one curb to get from car into front door; no rail   Home Layout One level   Home Equipment Walker - 2 wheels;Tub bench;Grab bars - toilet     Prior Function   Level of Independence Independent   Vocation Full time employment;On disability   Vocation Requirements Patient reports he  has not been working since November (due to bunion surgery), but once he is cleared from the MD, he would like to return to work. Patient is seated at work.    Leisure going to movies and plays, going to the park and rowing boats at the park      Cognition   Memory Impaired   Memory Impairment Other (comment)  reported he has noticed he can't "remember as easily"      ROM / Strength   AROM / PROM / Strength Strength     Strength   Strength Assessment Site Hip;Knee;Ankle   Right/Left Hip Right;Left   Right Hip Flexion 4/5   Left Hip Flexion 5/5   Right/Left Knee Right;Left   Right Knee Extension 5/5   Left Knee Extension 5/5   Right/Left Ankle Right;Left   Right Ankle Dorsiflexion 3-/5   Left Ankle Dorsiflexion 5/5     Transfers   Transfers Sit to Stand;Stand to Sit;Stand Pivot Transfers   Sit to Stand 5: Supervision   Sit to Stand Details (indicate cue type and reason) patient able to perform sit to stand transfer without use of UEs, but is not equally weight shifted on LEs (LLE weight bearing > RLE weight bearing)   Stand to Sit 5: Supervision   Stand to Sit Details patient is able to perform stand to sit transfer without UE support, but does not bear weight equally between his LEs (increased weight bearing on LLE relative to RLE)    Stand Pivot Transfers 5: Supervision   Stand Pivot Transfer Details (indicate cue type and reason) patient is able to perform transfer without UE support, but does not equally bear weight in LEs (LLE bears more weight than RLE)     Ambulation/Gait   Ambulation/Gait Yes   Ambulation/Gait Assistance 5: Supervision   Ambulation Distance (Feet) 351 Feet  assessed in 3 minute walk test    Assistive device None   Gait Pattern Step-to pattern;Decreased arm swing - right;Decreased step length - left;Decreased stance time - right;Decreased stride length;Decreased dorsiflexion - right;Decreased weight shift to right;Trendelenburg;Trunk flexed;Abducted -  left;Poor foot clearance - right   Ambulation Surface Level;Indoor   Gait velocity 1.74ft/s     6 Minute Walk- Baseline   6 Minute Walk- Baseline yes  3 minute walk test    BP (mmHg) (!)  138/97   HR (bpm) 80   Modified Borg Scale for Dyspnea 0- Nothing at all     6 Minute walk- Post Test   6 Minute Walk Post Test yes  3 minute walk test    BP (mmHg) (!)  150/105   HR (  bpm) 91   Modified Borg Scale for Dyspnea 4- somewhat severe  RR = 24 breaths/min      6 minute walk test results    Aerobic Endurance Distance Walked 351  3 minute walk test   Endurance additional comments Patient reported a noted decrease in his endurance and increase in fatigue with activity. PT performed a 3 minute walk test with plans to perform 6 minute walk test in future as patient's endurance increases.     Standardized Balance Assessment   Standardized Balance Assessment Berg Balance Test;Timed Up and Go Test     Berg Balance Test   Sit to Stand Able to stand without using hands and stabilize independently   Standing Unsupported Able to stand safely 2 minutes   Sitting with Back Unsupported but Feet Supported on Floor or Stool Able to sit safely and securely 2 minutes   Stand to Sit Sits safely with minimal use of hands   Transfers Able to transfer safely, minor use of hands   Standing Unsupported with Eyes Closed Able to stand 10 seconds safely   Standing Ubsupported with Feet Together Able to place feet together independently and stand 1 minute safely   From Standing, Reach Forward with Outstretched Arm Can reach forward >12 cm safely (5")   From Standing Position, Pick up Object from Floor Able to pick up shoe, needs supervision   From Standing Position, Turn to Look Behind Over each Shoulder Looks behind from both sides and weight shifts well   Turn 360 Degrees Able to turn 360 degrees safely but slowly   Standing Unsupported, Alternately Place Feet on Step/Stool Able to complete 4 steps without aid  or supervision   Standing Unsupported, One Foot in Front Able to plae foot ahead of the other independently and hold 30 seconds   Standing on One Leg Tries to lift leg/unable to hold 3 seconds but remains standing independently   Total Score 46     Timed Up and Go Test   Normal TUG (seconds) 15.37                           PT Education - 12/08/16 1528    Education provided Yes   Education Details plan of care    Person(s) Educated Patient   Methods Explanation   Comprehension Verbalized understanding          PT Short Term Goals - 12/08/16 1550      PT SHORT TERM GOAL #1   Title Patient will verbalize understanding of initial HEP. (TARGET DATE: 01/08/2017)   Time 4   Period Weeks   Status New     PT SHORT TERM GOAL #2   Title Patient will have a TUG score </=13.5 to indicate a decrease in his risk of falling. (TARGET DATE: 01/08/2017)    Time 4   Period Weeks   Status New     PT SHORT TERM GOAL #3   Title Patient will demonstrate ability to stand on LLE without UE support for >/=5 seconds to indicate a decrease in his risk of falling. (TARGET DATE: 01/08/2017)    Time 4   Period Weeks   Status New     PT SHORT TERM GOAL #4   Title Patient will demonstrate ability to ascend/descend 4 stairs with unilateral UE support to indicate a decrease in his risk of falling when ambulating on stairs in the community. (TARGET DATE: 01/08/2017)  Time 4   Period Weeks   Status New           PT Long Term Goals - 12/08/16 1543      PT LONG TERM GOAL #1   Title Patient will verbalize understanding of onogoing HEP. (TARGET DATE: 02/05/2017)    Time 8   Period Weeks   Status New     PT LONG TERM GOAL #2   Title PT will perform 6 minute walk test (without an assistive device) and goal will be set to indicate an increase in patient's endurance. (TARGET DATE: 02/05/2017)    Time 8   Period Weeks   Status New     PT LONG TERM GOAL #3   Title PT will perform FGA  (without an assistive device) and goal will be set to indicate a decrease in the patient's risk of falling. (TARGET DATE: 02/05/2017)    Time 8   Period Weeks   Status New     PT LONG TERM GOAL #4   Title Patient will have a gait velocity of >/= 2.110ft/s without an assistive device to indicate a community ambulator. (TARGET DATE: 02/05/2017)    Time 8   Period Weeks   Status New     PT LONG TERM GOAL #5   Title Patient will demonstrate ability to independently ambulate 500 feet outdoors (including ramps/curbs/grass) without an assistive device to indicate a decrease in his risk of falling when out in the community. (TARGET DATE: 02/05/2017)    Time 8   Period Weeks   Status New     Additional Long Term Goals   Additional Long Term Goals Yes     PT LONG TERM GOAL #6   Title Patient will score >/= 52/56 on the Berg Balance Test to indicate a decrease in his risk of falling. (TARGET DATE: 02/05/2017)    Time 8   Period Weeks   Status New               Plan - 12/08/16 1530    Clinical Impression Statement The patient is a 71 year old male presenting to PT s/p L MCA infarct on 08/30/2016. Patient has a past medical history that is significant for colostomy and reversal, gout, obesity, arthritis, atrial fibrillation, DM type 2, and hyperlipidemia. The patient was in acute care from 08/30/2016 - 09/02/2016, and then transferred to CIR from 09/02/2016 - 09/17/2016. The patient now lives alone at home (condo). Prior to the patient's CVA (08/30/2016), the patient had bunion surgery in November of 2017. Prior to his foot surgery, the patient was fully independent and working full time with [employer] as a Solicitor. The patient reports the majority of his job duties can be completed from a seated position. The patient reports he would like to return to working full time. Due to patient reports of reduced endurance and fatigue, PT performed a 3 minute walk test, and the patient ambulated 333ft with  associated elevation in BP (150/105). The patient's TUG score = 15.37s indicating he is at high risk to fall. His gait velocity =1.27ft/s, indicating he is a limited community ambulator. The patient has noted weakness in RLE as noted by his MMT scores, which negatively impact his mobility and balance. The patient's case is evolving and is of moderate complexity. Patient will benefit from skilled PT to address mobility and balance deficits outlined in this note.    Rehab Potential Good   Clinical Impairments Affecting Rehab Potential mild  cognitive and memory deficits, bunion surgery, arthritis, atrial fibrillation, DM type 2, gout, obesity, colostomy & reversal, hyperlipidemia    PT Frequency 2x / week   PT Duration 8 weeks   PT Treatment/Interventions ADLs/Self Care Home Management;Electrical Stimulation;Ultrasound;Cognitive remediation;Neuromuscular re-education;Balance training;Therapeutic exercise;Therapeutic activities;Functional mobility training;Stair training;Gait training;DME Instruction;Patient/family education;Manual techniques;Passive range of motion   PT Next Visit Plan Perform Functional Gait Assessment & set LTG, initiate HEP    Consulted and Agree with Plan of Care Patient      Patient will benefit from skilled therapeutic intervention in order to improve the following deficits and impairments:  Abnormal gait, Cardiopulmonary status limiting activity, Decreased activity tolerance, Decreased balance, Decreased cognition, Decreased coordination, Decreased safety awareness, Decreased range of motion, Decreased mobility, Decreased knowledge of use of DME, Decreased endurance, Decreased strength, Difficulty walking, Impaired flexibility, Impaired sensation, Postural dysfunction, Pain  Visit Diagnosis: Other abnormalities of gait and mobility  Unsteadiness on feet  Muscle weakness (generalized)      G-Codes - Jan 04, 2017 1600    Functional Assessment Tool Used (Outpatient Only) TUG =  15.37s; Berg Balance Test = 46/56    Functional Limitation Mobility: Walking and moving around   Mobility: Walking and Moving Around Current Status ([id number]) At least 40 percent but less than 60 percent impaired, limited or restricted   Mobility: Walking and Moving Around Goal Status [phone]) At least 1 percent but less than 20 percent impaired, limited or restricted       Problem List Patient Active Problem List   Diagnosis Date Noted  . Late effect of cerebrovascular accident (CVA) 09/30/2016  . Paroxysmal atrial fibrillation (HCC) 09/23/2016  . Status post placement of implantable loop recorder   . Acute blood loss anemia   . Hypoalbuminemia due to protein-calorie malnutrition (HCC)   . Labile blood pressure   . Acute ischemic left MCA stroke (HCC) 09/02/2016  . Ataxia, post-stroke   . Adjustment disorder with mixed anxiety and depressed mood   . Status post right foot surgery   . History of gout   . Primary insomnia   . Acute ischemic stroke (HCC)   . Idiopathic gout   . Morbid obesity (HCC)   . Diastolic dysfunction   . Benign essential HTN   . Diabetes mellitus (HCC)   . Leukocytosis   . CVA (cerebral vascular accident) (HCC) 08/30/2016  . Stroke (cerebrum) (HCC) 08/30/2016  . 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 Hammertoe of right foot 07/20/2016  . Hallux valgus, acquired, bilateral 02/19/2014  . Gout 02/13/2013  . Hyperlipidemia 01/18/2012  . Obesity 01/18/2012    Cherina Dhillon 12/09/2016, 6:03 AM  [facility] [address] [place], [place], [address] Phone: [phone]   Fax:  [phone]  Name: Aaron Moon MRN: [id number] Date of Birth: 06-28-1946

## 2016-12-10 ENCOUNTER — Encounter: Payer: Self-pay | Admitting: Physical Therapy

## 2016-12-10 ENCOUNTER — Ambulatory Visit: Payer: Federal, State, Local not specified - PPO | Admitting: Physical Therapy

## 2016-12-10 ENCOUNTER — Ambulatory Visit: Payer: Federal, State, Local not specified - PPO | Admitting: Occupational Therapy

## 2016-12-10 DIAGNOSIS — R278 Other lack of coordination: Secondary | ICD-10-CM

## 2016-12-10 DIAGNOSIS — R2681 Unsteadiness on feet: Secondary | ICD-10-CM

## 2016-12-10 DIAGNOSIS — M6281 Muscle weakness (generalized): Secondary | ICD-10-CM

## 2016-12-10 DIAGNOSIS — I69351 Hemiplegia and hemiparesis following cerebral infarction affecting right dominant side: Secondary | ICD-10-CM

## 2016-12-10 DIAGNOSIS — R2689 Other abnormalities of gait and mobility: Secondary | ICD-10-CM | POA: Diagnosis not present

## 2016-12-10 DIAGNOSIS — R41840 Attention and concentration deficit: Secondary | ICD-10-CM

## 2016-12-10 NOTE — Therapy (Signed)
[facility] [address] [place], [place], [address] Phone: [phone]   Fax:  [phone]  Occupational Therapy Treatment  Patient Details  Name: Aaron Moon MRN: [id number] Date of Birth: 09-Apr-1946 Referring Provider: Dr. Claudette Laws  Encounter Date: 12/10/2016      OT End of Session - 12/10/16 1454    Visit Number 2   Number of Visits 17   Date for OT Re-Evaluation 02/05/17   Authorization Type Federal BC/BS, [place] - G code needed   Authorization - Visit Number 2   Authorization - Number of Visits 10   OT Start Time 1449   OT Stop Time 1530   OT Time Calculation (min) 41 min   Activity Tolerance Patient tolerated treatment well   Behavior During Therapy [facility] for tasks assessed/performed      Past Medical History:  Diagnosis Date  . Arthritis   . Atrial fibrillation (HCC)   . Diabetes mellitus without complication (HCC)   . Gout   . Hyperlipidemia   . Obesity   . Stroke [facility])     Past Surgical History:  Procedure Laterality Date  . ABDOMINAL SURGERY     colostomy & colostomy reversal  . ANKLE SURGERY    . EP IMPLANTABLE DEVICE N/A 09/02/2016   Procedure: Loop Recorder Insertion;  Surgeon: Duke Salvia, MD;  Location: [facility] INVASIVE CV LAB;  Service: Cardiovascular;  Laterality: N/A;  . TEE WITHOUT CARDIOVERSION N/A 09/02/2016   Procedure: TRANSESOPHAGEAL ECHOCARDIOGRAM (TEE);  Surgeon: Laurey Morale, MD;  Location: [facility] ENDOSCOPY;  Service: Cardiovascular;  Laterality: N/A;    There were no vitals filed for this visit.      Subjective Assessment - 12/10/16 1452    Subjective  Pt frustrated that recovery is not faster   Pertinent History Lt MCA CVA (08/30/16) PMH: HTN, A-fib, DM, OA   Patient Stated Goals to get my Rt arm stronger   Currently in Pain? Yes   Pain Score 2    Pain Location Foot   Pain Orientation Right;Left   Pain Descriptors / Indicators Tingling   Aggravating Factors  unknown    Pain Relieving Factors medication      Writing 2 sentences with incr time, but good legibility.  Pt demo difficulty with spelling and letter formation at times, but able to self correct.  Reports difficulty remembering how to write cursive letter.                          OT Education - 12/10/16 1500    Education provided Yes   Education Details Coordination/Green Putty HEP (already has putty)--see pt instructions   Person(s) Educated Patient   Methods Explanation;Demonstration;Verbal cues;Handout   Comprehension Verbalized understanding;Returned demonstration  mod difficulty overall          OT Short Term Goals - 12/08/16 1241      OT SHORT TERM GOAL #1   Title Independent with coordination and putty HEP (Due 01/08/17)   Time 4   Period Weeks   Status New     OT SHORT TERM GOAL #2   Title Pt to improve functional use of RUE as evidenced by performing 45 blocks on Blocks & Box test   Baseline eval = 39 (Lt = 49)    Time 4   Period Weeks   Status New     OT SHORT TERM GOAL #3   Title Pt to write paragraph maintaining 90%  or greater legibility   Baseline eval: sentence at 75% legibility   Time 4   Period Weeks   Status New           OT Long Term Goals - 12/08/16 1244      OT LONG TERM GOAL #1   Title Independent with RUE strengthening HEP for strength/endurance (due by 02/05/17)    Time --  6-8 weeks for all LTG's   Status New     OT LONG TERM GOAL #2   Title Improve coordination Rt hand as evidenced by performing 9 hole peg test in 30 sec. or less   Baseline eval = 36.56 sec   Status New     OT LONG TERM GOAL #3   Title Pt to verbalize understanding of memory compensatory strategies and how to implement at home/work   Status New               Plan - 12/10/16 1458    Clinical Impression Statement Pt with mod difficulty with coordination of R hand and tends to use shoulder compensation for coordination tasks.  ?apraxia during  tasks.   Rehab Potential Good   OT Frequency 2x / week   OT Duration 8 weeks  plus eval (anticipate only 4-6 weeks needed)   OT Treatment/Interventions Self-care/ADL training;DME and/or AE instruction;Patient/family education;Therapeutic exercises;Therapeutic activities;Neuromuscular education;Functional Mobility Training;Passive range of motion;Cognitive remediation/compensation;Visual/perceptual remediation/compensation;Manual Therapy   Plan continue to address coordination and RUE strength   OT Home Exercise Plan Education provided:  12/10/16  Coordination HEP, green putty HEP   Consulted and Agree with Plan of Care Patient      Patient will benefit from skilled therapeutic intervention in order to improve the following deficits and impairments:  Decreased coordination, Decreased endurance, Decreased safety awareness, Decreased activity tolerance, Decreased knowledge of precautions, Impaired UE functional use, Decreased mobility, Decreased strength, Decreased cognition, Impaired perceived functional ability  Visit Diagnosis: Hemiplegia and hemiparesis following cerebral infarction affecting right dominant side (HCC)  Other lack of coordination  Muscle weakness (generalized)  Attention and concentration deficit  Other abnormalities of gait and mobility  Unsteadiness on feet    Problem List Patient Active Problem List   Diagnosis Date Noted  . Late effect of cerebrovascular accident (CVA) 09/30/2016  . Paroxysmal atrial fibrillation (HCC) 09/23/2016  . Status post placement of implantable loop recorder   . Acute blood loss anemia   . Hypoalbuminemia due to protein-calorie malnutrition (HCC)   . Labile blood pressure   . Acute ischemic left MCA stroke (HCC) 09/02/2016  . Ataxia, post-stroke   . Adjustment disorder with mixed anxiety and depressed mood   . Status post right foot surgery   . History of gout   . Primary insomnia   . Acute ischemic stroke (HCC)   . Idiopathic  gout   . Morbid obesity (HCC)   . Diastolic dysfunction   . Benign essential HTN   . Diabetes mellitus (HCC)   . Leukocytosis   . CVA (cerebral vascular accident) (HCC) 08/30/2016  . Stroke (cerebrum) (HCC) 08/30/2016  . 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 Hammertoe of right foot 07/20/2016  . Hallux valgus, acquired, bilateral 02/19/2014  . Gout 02/13/2013  . Hyperlipidemia 01/18/2012  . Obesity 01/18/2012    [facility] 12/10/2016, 4:07 PM  [facility] [address] [place], [place], [address] Phone: [phone]   Fax:  [phone]  Name: Tellis Spivak MRN: [id number] Date of Birth: 08/11/1945  Willa FraterAngela Freeman, OTR/L [facility] [address] [place], [place]  [id number] [phone] phone [phone] 12/10/16 4:07 PM

## 2016-12-10 NOTE — Patient Instructions (Addendum)
  Coordination Activities  Perform the following activities for 20 minutes 1 times per day with right hand(s).   Rotate ball in fingertips (clockwise and counter-clockwise).  Toss ball between hands.  Toss ball in air and catch with the same hand.  Flip cards 1 at a time as fast as you can.  Deal cards with your thumb (Hold deck in hand and push card off top with thumb).  Shuffle cards.  Pick up coins and stack.  Pick up coins one at a time until you get 5-10 in your hand, then move coins from palm to fingertips to stack one at a time.  Practice writing.    1. Grip Strengthening (Resistive Putty)   Squeeze putty using thumb and all fingers. Repeat 20 times. Do 1 sessions per day.   Extension (Assistive Putty)   Roll putty back and forth, being sure to use all fingertips. Repeat 3 times. Do 1 sessions per day.  Then pinch as below.   Palmar Pinch Strengthening (Resistive Putty)   Pinch putty between thumb and each fingertip in turn after rolling out      Put coins/beans in putty and pull out with right hand.

## 2016-12-10 NOTE — Patient Instructions (Addendum)
Perform these next to a counter top so you can hold on for balance:    "I love a Parade" Lift   At counter for balance as needed: high knee marching forward and then backward. 3 second pauses with each knee lift.  Repeat 3 laps each way. Do _1-2_ sessions per day. http://gt2.exer.us/345   Copyright  VHI. All rights reserved.  Feet Heel-Toe "Tandem"   At counter: Arms at sides, walk a straight line forward bringing one foot directly in front of the other, and then a straight line backwards bringing one foot directly behind the other one.  Repeat for _3 laps each way. Do _1-2_ sessions per day.  Copyright  VHI. All rights reserved.   Perform these in a corner with a chair in front for safety:  Feet Together (Compliant Surface) Varied Arm Positions - Eyes Closed    Stand on compliant surface: _pillow/s_ with feet together and arms as needed for balance Close eyes and visualize upright position. Hold__20__ seconds. Repeat _3_ times per session. Do _1-2_ sessions per day.  Copyright  VHI. All rights reserved.    Feet Together (Compliant Surface) Head Motion - Eyes Closed    Stand on compliant surface: _pillow/s with feet together. Close eyes and move head slowly: 1. Up and down 2. Left and right  Repeat _10_ times per session. Do _1-2_ sessions per day.  Copyright  VHI. All rights reserved.

## 2016-12-11 NOTE — Therapy (Signed)
[facility] [address] [place], [place], [address] Phone: [phone]   Fax:  [phone]  Physical Therapy Treatment  Patient Details  Name: Aaron Moon MRN: [id number] Date of Birth: 08-Sep-1945 Referring Provider: Claudette Laws MD   Encounter Date: 12/10/2016      PT End of Session - 12/10/16 1533    Visit Number 2   Number of Visits 18   Date for PT Re-Evaluation 02/05/17   Authorization Type Federal BCBS    PT Start Time 1533   PT Stop Time 1615   PT Time Calculation (min) 42 min   Equipment Utilized During Treatment Gait belt   Activity Tolerance Patient tolerated treatment well   Behavior During Therapy [place] for tasks assessed/performed      Past Medical History:  Diagnosis Date  . Arthritis   . Atrial fibrillation (HCC)   . Diabetes mellitus without complication (HCC)   . Gout   . Hyperlipidemia   . Obesity   . Stroke [facility])     Past Surgical History:  Procedure Laterality Date  . ABDOMINAL SURGERY     colostomy & colostomy reversal  . ANKLE SURGERY    . EP IMPLANTABLE DEVICE N/A 09/02/2016   Procedure: Loop Recorder Insertion;  Surgeon: Duke Salvia, MD;  Location: [facility] INVASIVE CV LAB;  Service: Cardiovascular;  Laterality: N/A;  . TEE WITHOUT CARDIOVERSION N/A 09/02/2016   Procedure: TRANSESOPHAGEAL ECHOCARDIOGRAM (TEE);  Surgeon: Laurey Morale, MD;  Location: [facility] ENDOSCOPY;  Service: Cardiovascular;  Laterality: N/A;    There were no vitals filed for this visit.      Subjective Assessment - 12/10/16 1533    Subjective No new complaints. No falls to report. No pain currently.    Limitations Lifting;Standing;Walking;House hold activities   Patient Stated Goals to walk better/with more ease             [facility] PT Assessment - 12/10/16 1536      Functional Gait  Assessment   Gait assessed  Yes   Gait Level Surface Walks 20 ft, slow speed, abnormal gait pattern, evidence for  imbalance or deviates 10-15 in outside of the 12 in walkway width. Requires more than 7 sec to ambulate 20 ft.  >9 seconds   Change in Gait Speed Makes only minor adjustments to walking speed, or accomplishes a change in speed with significant gait deviations, deviates 10-15 in outside the 12 in walkway width, or changes speed but loses balance but is able to recover and continue walking.  no sig change in gt speed   Gait with Horizontal Head Turns Performs head turns smoothly with slight change in gait velocity (eg, minor disruption to smooth gait path), deviates 6-10 in outside 12 in walkway width, or uses an assistive device.   Gait with Vertical Head Turns Performs task with slight change in gait velocity (eg, minor disruption to smooth gait path), deviates 6 - 10 in outside 12 in walkway width or uses assistive device   Gait and Pivot Turn Turns slowly, requires verbal cueing, or requires several small steps to catch balance following turn and stop   Step Over Obstacle Is able to step over one shoe box (4.5 in total height) but must slow down and adjust steps to clear box safely. May require verbal cueing.   Gait with Narrow Base of Support Ambulates less than 4 steps heel to toe or cannot perform without assistance.   Gait with Eyes Closed Walks 20  ft, slow speed, abnormal gait pattern, evidence for imbalance, deviates 10-15 in outside 12 in walkway width. Requires more than 9 sec to ambulate 20 ft.   Ambulating Backwards Walks 20 ft, slow speed, abnormal gait pattern, evidence for imbalance, deviates 10-15 in outside 12 in walkway width.   Steps Alternating feet, must use rail.   Total Score 12   FGA comment: 12/30      issued the following to pt's HEP:   Perform these next to a counter top so you can hold on for balance:    "I love a Parade" Lift   At counter for balance as needed: high knee marching forward and then backward. 3 second pauses with each knee lift.  Repeat 3 laps each  way. Do _1-2_ sessions per day. http://gt2.exer.us/345   Copyright  VHI. All rights reserved.  Feet Heel-Toe "Tandem"   At counter: Arms at sides, walk a straight line forward bringing one foot directly in front of the other, and then a straight line backwards bringing one foot directly behind the other one.  Repeat for _3 laps each way. Do _1-2_ sessions per day.  Copyright  VHI. All rights reserved.   Perform these in a corner with a chair in front for safety:  Feet Together (Compliant Surface) Varied Arm Positions - Eyes Closed    Stand on compliant surface: _pillow/s_ with feet together and arms as needed for balance Close eyes and visualize upright position. Hold__20__ seconds. Repeat _3_ times per session. Do _1-2_ sessions per day.  Copyright  VHI. All rights reserved.    Feet Together (Compliant Surface) Head Motion - Eyes Closed    Stand on compliant surface: _pillow/s with feet together. Close eyes and move head slowly: 1. Up and down 2. Left and right  Repeat _10_ times per session. Do _1-2_ sessions per day.  Copyright  VHI. All rights reserved.          PT Short Term Goals - 12/08/16 1550      PT SHORT TERM GOAL #1   Title Patient will verbalize understanding of initial HEP. (TARGET DATE: 01/08/2017)   Time 4   Period Weeks   Status New     PT SHORT TERM GOAL #2   Title Patient will have a TUG score </=13.5 to indicate a decrease in his risk of falling. (TARGET DATE: 01/08/2017)    Time 4   Period Weeks   Status New     PT SHORT TERM GOAL #3   Title Patient will demonstrate ability to stand on LLE without UE support for >/=5 seconds to indicate a decrease in his risk of falling. (TARGET DATE: 01/08/2017)    Time 4   Period Weeks   Status New     PT SHORT TERM GOAL #4   Title Patient will demonstrate ability to ascend/descend 4 stairs with unilateral UE support to indicate a decrease in his risk of falling when ambulating on stairs in the  community. (TARGET DATE: 01/08/2017)    Time 4   Period Weeks   Status New           PT Long Term Goals - 12/08/16 1543      PT LONG TERM GOAL #1   Title Patient will verbalize understanding of onogoing HEP. (TARGET DATE: 02/05/2017)    Time 8   Period Weeks   Status New     PT LONG TERM GOAL #2   Title PT will perform 6 minute walk test (  without an assistive device) and goal will be set to indicate an increase in patient's endurance. (TARGET DATE: 02/05/2017)    Time 8   Period Weeks   Status New     PT LONG TERM GOAL #3   Title PT will perform FGA (without an assistive device) and goal will be set to indicate a decrease in the patient's risk of falling. (TARGET DATE: 02/05/2017)    Time 8   Period Weeks   Status New     PT LONG TERM GOAL #4   Title Patient will have a gait velocity of >/= 2.79ft/s without an assistive device to indicate a community ambulator. (TARGET DATE: 02/05/2017)    Time 8   Period Weeks   Status New     PT LONG TERM GOAL #5   Title Patient will demonstrate ability to independently ambulate 500 feet outdoors (including ramps/curbs/grass) without an assistive device to indicate a decrease in his risk of falling when out in the community. (TARGET DATE: 02/05/2017)    Time 8   Period Weeks   Status New     Additional Long Term Goals   Additional Long Term Goals Yes     PT LONG TERM GOAL #6   Title Patient will score >/= 52/56 on the Berg Balance Test to indicate a decrease in his risk of falling. (TARGET DATE: 02/05/2017)    Time 8   Period Weeks   Status New           Plan - 12/10/16 1533    Clinical Impression Statement Today's skilled session established baseline for FGA with primary PT to set goals. Remainder of session addressed establishement of HEP for strengthening and balance. Pt is making steady progress toward goals and should benefit from continued PT to progress toward unmet goals.    Rehab Potential Good   Clinical Impairments  Affecting Rehab Potential mild cognitive and memory deficits, bunion surgery, arthritis, atrial fibrillation, DM type 2, gout, obesity, colostomy & reversal, hyperlipidemia    PT Frequency 2x / week   PT Duration 8 weeks   PT Treatment/Interventions ADLs/Self Care Home Management;Electrical Stimulation;Ultrasound;Cognitive remediation;Neuromuscular re-education;Balance training;Therapeutic exercise;Therapeutic activities;Functional mobility training;Stair training;Gait training;DME Instruction;Patient/family education;Manual techniques;Passive range of motion   PT Next Visit Plan continued to work on gait, strengthening and balance.    Consulted and Agree with Plan of Care Patient      Patient will benefit from skilled therapeutic intervention in order to improve the following deficits and impairments:  Abnormal gait, Cardiopulmonary status limiting activity, Decreased activity tolerance, Decreased balance, Decreased cognition, Decreased coordination, Decreased safety awareness, Decreased range of motion, Decreased mobility, Decreased knowledge of use of DME, Decreased endurance, Decreased strength, Difficulty walking, Impaired flexibility, Impaired sensation, Postural dysfunction, Pain  Visit Diagnosis: Muscle weakness (generalized)  Other abnormalities of gait and mobility  Unsteadiness on feet  Hemiplegia and hemiparesis following cerebral infarction affecting right dominant side [facility])     Problem List Patient Active Problem List   Diagnosis Date Noted  . Late effect of cerebrovascular accident (CVA) 09/30/2016  . Paroxysmal atrial fibrillation (HCC) 09/23/2016  . Status post placement of implantable loop recorder   . Acute blood loss anemia   . Hypoalbuminemia due to protein-calorie malnutrition (HCC)   . Labile blood pressure   . Acute ischemic left MCA stroke (HCC) 09/02/2016  . Ataxia, post-stroke   . Adjustment disorder with mixed anxiety and depressed mood   . Status post  right foot surgery   .  History of gout   . Primary insomnia   . Acute ischemic stroke (HCC)   . Idiopathic gout   . Morbid obesity (HCC)   . Diastolic dysfunction   . Benign essential HTN   . Diabetes mellitus (HCC)   . Leukocytosis   . CVA (cerebral vascular accident) (HCC) 08/30/2016  . Stroke (cerebrum) (HCC) 08/30/2016  . 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 Hammertoe of right foot 07/20/2016  . Hallux valgus, acquired, bilateral 02/19/2014  . Gout 02/13/2013  . Hyperlipidemia 01/18/2012  . Obesity 01/18/2012    Sallyanne Kuster, PTA, [facility]patient Neuro [facility] [address] [place], [place] [address] [phone] 12/11/16, 1:56 PM   Name: Aaron Moon MRN: [id number] Date of Birth: 06/08/1946

## 2016-12-15 ENCOUNTER — Ambulatory Visit: Payer: Federal, State, Local not specified - PPO | Admitting: Physical Therapy

## 2016-12-15 ENCOUNTER — Ambulatory Visit: Payer: Federal, State, Local not specified - PPO | Admitting: Occupational Therapy

## 2016-12-15 ENCOUNTER — Encounter: Payer: Self-pay | Admitting: Physical Therapy

## 2016-12-15 DIAGNOSIS — M6281 Muscle weakness (generalized): Secondary | ICD-10-CM

## 2016-12-15 DIAGNOSIS — R2689 Other abnormalities of gait and mobility: Secondary | ICD-10-CM | POA: Diagnosis not present

## 2016-12-15 DIAGNOSIS — I69351 Hemiplegia and hemiparesis following cerebral infarction affecting right dominant side: Secondary | ICD-10-CM

## 2016-12-15 DIAGNOSIS — R2681 Unsteadiness on feet: Secondary | ICD-10-CM

## 2016-12-15 DIAGNOSIS — R278 Other lack of coordination: Secondary | ICD-10-CM

## 2016-12-15 DIAGNOSIS — R41840 Attention and concentration deficit: Secondary | ICD-10-CM

## 2016-12-15 NOTE — Therapy (Signed)
[facility] [address] [place], [place], [address] Phone: [phone]   Fax:  [phone]  Occupational Therapy Treatment  Patient Details  Name: Aaron SouDana Moon MRN: [id number] Date of Birth: Oct 07, 1945 Referring Provider: Dr. Claudette LawsAndrew Kirsteins  Encounter Date: 12/15/2016      OT End of Session - 12/15/16 1158    Visit Number 3   Number of Visits 17   Date for OT Re-Evaluation 02/05/17   Authorization Type Federal BC/BS, [place] - G code needed   Authorization - Visit Number 3   Authorization - Number of Visits 10   OT Start Time 1150   OT Stop Time 1230   OT Time Calculation (min) 40 min   Activity Tolerance Patient tolerated treatment well   Behavior During Therapy Aaron Moon for tasks assessed/performed      Past Medical History:  Diagnosis Date  . Arthritis   . Atrial fibrillation (HCC)   . Diabetes mellitus without complication (HCC)   . Gout   . Hyperlipidemia   . Obesity   . Stroke [facility](HCC)     Past Surgical History:  Procedure Laterality Date  . ABDOMINAL SURGERY     colostomy & colostomy reversal  . ANKLE SURGERY    . EP IMPLANTABLE DEVICE N/A 09/02/2016   Procedure: Loop Recorder Insertion;  Surgeon: Duke SalviaSteven C Klein, MD;  [facility];  Service: Cardiovascular;  Laterality: N/A;  . TEE WITHOUT CARDIOVERSION N/A 09/02/2016   Procedure: TRANSESOPHAGEAL ECHOCARDIOGRAM (TEE);  Surgeon: Laurey Moralealton S McLean, MD;  [facility];  Service: Cardiovascular;  Laterality: N/A;    There were no vitals filed for this visit.      Subjective Assessment - 12/15/16 1156    Subjective  Gout flare up in L wrist   Pertinent History Lt MCA CVA (08/30/16) PMH: HTN, A-fib, DM, OA   Patient Stated Goals to get my Rt arm stronger   Currently in Pain? No/denies       Writing shopping list with good legibility, but incr time needed.  In standing, functional reaching with RUE to place/remove clothespins with  1-8lb resist on vertical pole for incr activity tolerance/strength, min cueing for compensation patterns.                           OT Education - 12/15/16 1210    Education Details Reviewed coordination HEP as able (unable to shuffle cards or toss ball between hands due to gout in L hand)   Person(s) Educated Patient   Methods Explanation;Demonstration;Verbal cues   Comprehension Verbalized understanding;Returned demonstration;Verbal cues required          OT Short Term Goals - 12/08/16 1241      OT SHORT TERM GOAL #1   Title Independent with coordination and putty HEP (Due 01/08/17)   Time 4   Period Weeks   Status New     OT SHORT TERM GOAL #2   Title Pt to improve functional use of RUE as evidenced by performing 45 blocks on Blocks & Box test   Baseline eval = 39 (Lt = 49)    Time 4   Period Weeks   Status New     OT SHORT TERM GOAL #3   Title Pt to write paragraph maintaining 90% or greater legibility   Baseline eval: sentence at 75% legibility   Time 4   Period Weeks   Status New  OT Long Term Goals - 12/08/16 1244      OT LONG TERM GOAL #1   Title Independent with RUE strengthening HEP for strength/endurance (due by 02/05/17)    Time --  6-8 weeks for all LTG's   Status New     OT LONG TERM GOAL #2   Title Improve coordination Rt hand as evidenced by performing 9 hole peg test in 30 sec. or less   Baseline eval = 36.56 sec   Status New     OT LONG TERM GOAL #3   Title Pt to verbalize understanding of memory compensatory strategies and how to implement at home/work   Status New               Plan - 12/15/16 1159    Clinical Impression Statement Pt unable to use nondominant L hand for bilateral coordination tasks today due gout in L hand.  However, pt does demo some improvement in L hand coordination.  Encouraged pt to incr carryover with HEP at home.   Rehab Potential Good   OT Frequency 2x / week   OT Duration 8  weeks  plus eval (anticipate only 4-6 weeks needed)   OT Treatment/Interventions Self-care/ADL training;DME and/or AE instruction;Patient/family education;Therapeutic exercises;Therapeutic activities;Neuromuscular education;Functional Mobility Training;Passive range of motion;Cognitive remediation/compensation;Visual/perceptual remediation/compensation;Manual Therapy   Plan Memory strategies, shoulder HEP   OT Home Exercise Plan Education provided:  12/10/16  Coordination HEP, green putty HEP   Consulted and Agree with Plan of Care Patient      Patient will benefit from skilled therapeutic intervention in order to improve the following deficits and impairments:  Decreased coordination, Decreased endurance, Decreased safety awareness, Decreased activity tolerance, Decreased knowledge of precautions, Impaired UE functional use, Decreased mobility, Decreased strength, Decreased cognition, Impaired perceived functional ability  Visit Diagnosis: Other lack of coordination  Muscle weakness (generalized)  Attention and concentration deficit  Hemiplegia and hemiparesis following cerebral infarction affecting right dominant side (HCC)  Unsteadiness on feet    Problem List Patient Active Problem List   Diagnosis Date Noted  . Late effect of cerebrovascular accident (CVA) 09/30/2016  . Paroxysmal atrial fibrillation (HCC) 09/23/2016  . Status post placement of implantable loop recorder   . Acute blood loss anemia   . Hypoalbuminemia due to protein-calorie malnutrition (HCC)   . Labile blood pressure   . Acute ischemic left MCA stroke (HCC) 09/02/2016  . Ataxia, post-stroke   . Adjustment disorder with mixed anxiety and depressed mood   . Status post right foot surgery   . History of gout   . Primary insomnia   . Acute ischemic stroke (HCC)   . Idiopathic gout   . Morbid obesity (HCC)   . Diastolic dysfunction   . Benign essential HTN   . Diabetes mellitus (HCC)   . Leukocytosis   .  CVA (cerebral vascular accident) (HCC) 08/30/2016  . Stroke (cerebrum) (HCC) 08/30/2016  . 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 Hammertoe of right foot 07/20/2016  . Hallux valgus, acquired, bilateral 02/19/2014  . Gout 02/13/2013  . Hyperlipidemia 01/18/2012  . Obesity 01/18/2012    [facility] 12/15/2016, 12:17 PM  [facility] [address] [place], [place], [address] Phone: [phone]   Fax:  [phone]  Name: Errol Ala MRN: [id number] Date of Birth: Dec 01, 1945   Willa Frater, OTR/L [facility] [address] [place], [place]  [address] [phone] phone [phone] 12/15/16 12:18 PM

## 2016-12-15 NOTE — Therapy (Signed)
[facility] [address] [place], [place], [address] Phone: [phone]   Fax:  [phone]  Physical Therapy Treatment  Patient Details  Name: Aaron SouDana Moon MRN: [id number] Date of Birth: 05-06-1946 Referring Provider: Claudette LawsAndrew Kirsteins MD   Encounter Date: 12/15/2016      PT End of Session - 12/15/16 1544    Visit Number 3   Number of Visits 18   Date for PT Re-Evaluation 02/05/17   Authorization Type Federal [employer]    PT Start Time 1315   PT Stop Time 1400   PT Time Calculation (min) 45 min   Equipment Utilized During Treatment Gait belt   Activity Tolerance Patient tolerated treatment well   Behavior During Therapy [facility] for tasks assessed/performed      Past Medical History:  Diagnosis Date  . Arthritis   . Atrial fibrillation (HCC)   . Diabetes mellitus without complication (HCC)   . Gout   . Hyperlipidemia   . Obesity   . Stroke [facility](HCC)     Past Surgical History:  Procedure Laterality Date  . ABDOMINAL SURGERY     colostomy & colostomy reversal  . ANKLE SURGERY    . EP IMPLANTABLE DEVICE N/A 09/02/2016   Procedure: Loop Recorder Insertion;  Surgeon: Duke SalviaSteven C Klein, MD;  [facility];  Service: Cardiovascular;  Laterality: N/A;  . TEE WITHOUT CARDIOVERSION N/A 09/02/2016   Procedure: TRANSESOPHAGEAL ECHOCARDIOGRAM (TEE);  Surgeon: Laurey Moralealton S McLean, MD;  [facility];  Service: Cardiovascular;  Laterality: N/A;    There were no vitals filed for this visit.      Subjective Assessment - 12/15/16 1321    Subjective Patient presents with a flare up of gout in L hand. He reports he started the medication approximately 24 hours ago. Patient denies any complaints, changes, or falls since last PT visit.    Limitations Lifting;Standing;Walking;House hold activities   Patient Stated Goals to walk better/with more ease    Currently in Pain? Yes   Pain Score 2    Pain Location Hand    Pain Orientation Left   Pain Descriptors / Indicators Aching   Pain Type Acute pain  flare up of gout   Pain Onset In the past 7 days                         [facility] Adult PT Treatment/Exercise - 12/15/16 1315      Transfers   Transfers Sit to Stand;Stand to Dollar GeneralSit;Stand Pivot Transfers   Sit to Stand 5: Supervision   Stand to Sit 5: Supervision   Stand to Sit Details requires cueing to feel chair with back of bilateral LEs and reach for armrest when fatigued    Stand Pivot Transfers --     Ambulation/Gait   Ambulation/Gait Yes   Ambulation/Gait Assistance 5: Supervision   Ambulation Distance (Feet) 150 Feet   Assistive device None   Gait Pattern Step-to pattern;Decreased arm swing - right;Decreased step length - left;Decreased stance time - right;Decreased stride length;Decreased dorsiflexion - right;Decreased weight shift to right;Trendelenburg;Trunk flexed;Abducted - left;Poor foot clearance - right   Ambulation Surface Level;Indoor   Gait velocity --     Neuro Re-ed    Neuro Re-ed Details  In parallel bars: patient performed lateral weight shift activities. Patient able to complete standing hip abduction of RLE with RUE support on parallel bar (unable to utilize LUE to stabilize due to gout). PT cued patient  through lateral weight shift activity with PT providing demonstration and cueing for weight shift and technique. Progressed to weight shift activity with feet in forward step position. Patient requires demonstration and verbal and tactile cueing for weight shift especially over RLE. Patient performed side stepping activity and requires demonstration and cueing for weight shift and technique. On rockerboard patient maintained static standing balance for approximately 15 seconds. Performed lateral tilting of rockerboard with hold and requires demonstration and cueing from PT for technique and proper weight shift. Patient required occasional use of RUE on parallel bar to  stabilize throughout rockerboard balance activities.                   PT Short Term Goals - 12/08/16 1550      PT SHORT TERM GOAL #1   Title Patient will verbalize understanding of initial HEP. (TARGET DATE: 01/08/2017)   Time 4   Period Weeks   Status New     PT SHORT TERM GOAL #2   Title Patient will have a TUG score </=13.5 to indicate a decrease in his risk of falling. (TARGET DATE: 01/08/2017)    Time 4   Period Weeks   Status New     PT SHORT TERM GOAL #3   Title Patient will demonstrate ability to stand on LLE without UE support for >/=5 seconds to indicate a decrease in his risk of falling. (TARGET DATE: 01/08/2017)    Time 4   Period Weeks   Status New     PT SHORT TERM GOAL #4   Title Patient will demonstrate ability to ascend/descend 4 stairs with unilateral UE support to indicate a decrease in his risk of falling when ambulating on stairs in the community. (TARGET DATE: 01/08/2017)    Time 4   Period Weeks   Status New           PT Long Term Goals - 12/15/16 1639      PT LONG TERM GOAL #1   Title Patient will verbalize understanding of onogoing HEP. (TARGET DATE: 02/05/2017)    Time 8   Period Weeks   Status New     PT LONG TERM GOAL #2   Title PT will perform 6 minute walk test (without an assistive device) and goal will be set to indicate an increase in patient's endurance. (TARGET DATE: 02/05/2017)    Time 8   Period Weeks   Status New     PT LONG TERM GOAL #3   Title Patient will score >/= 20/30 on the FGA (without an assistive device) to indicate a decrease in the patient's risk of falling. (TARGET DATE: 02/05/2017)    Baseline FGA = 12/30 (performed 12/10/2016)   Time 8   Period Weeks   Status New     PT LONG TERM GOAL #4   Title Patient will have a gait velocity of >/= 2.54ft/s without an assistive device to indicate a community ambulator. (TARGET DATE: 02/05/2017)    Time 8   Period Weeks   Status New     PT LONG TERM GOAL #5   Title  Patient will demonstrate ability to independently ambulate 500 feet outdoors (including ramps/curbs/grass) without an assistive device to indicate a decrease in his risk of falling when out in the community. (TARGET DATE: 02/05/2017)    Time 8   Period Weeks   Status New     PT LONG TERM GOAL #6   Title Patient will score >/= 52/56 on the  Berg Balance Test to indicate a decrease in his risk of falling. (TARGET DATE: 02/05/2017)    Time 8   Period Weeks   Status New               Plan - 12/15/16 1545    Clinical Impression Statement Today's skilled PT session focused on advancing balance activities in the parallel bars with emphasis on lateral weight shift onto RLE. Patient requires min guard to min A during balance activities for safety. Patient only utilized RUE support on parallel bar due to gout in LUE. Patient performed weight shifting, side stepping, and rockerboard activities in parallel bars with demonstration and verbal and tactile cueing required from PT for technique. Patient required multiple seated rest breaks due to fatigue. Patient is making progress, and will benefit from continued skilled PT to address functional mobility deficits.   Rehab Potential Good   Clinical Impairments Affecting Rehab Potential mild cognitive and memory deficits, bunion surgery, arthritis, atrial fibrillation, DM type 2, gout, obesity, colostomy & reversal, hyperlipidemia    PT Frequency 2x / week   PT Duration 8 weeks   PT Treatment/Interventions ADLs/Self Care Home Management;Electrical Stimulation;Ultrasound;Cognitive remediation;Neuromuscular re-education;Balance training;Therapeutic exercise;Therapeutic activities;Functional mobility training;Stair training;Gait training;DME Instruction;Patient/family education;Manual techniques;Passive range of motion   PT Next Visit Plan advance balance activities with emphasis on weight shifting onto RLE    Consulted and Agree with Plan of Care Patient       Patient will benefit from skilled therapeutic intervention in order to improve the following deficits and impairments:  Abnormal gait, Cardiopulmonary status limiting activity, Decreased activity tolerance, Decreased balance, Decreased cognition, Decreased coordination, Decreased safety awareness, Decreased range of motion, Decreased mobility, Decreased knowledge of use of DME, Decreased endurance, Decreased strength, Difficulty walking, Impaired flexibility, Impaired sensation, Postural dysfunction, Pain  Visit Diagnosis: Other lack of coordination  Muscle weakness (generalized)  Other abnormalities of gait and mobility  Unsteadiness on feet     Problem List Patient Active Problem List   Diagnosis Date Noted  . Late effect of cerebrovascular accident (CVA) 09/30/2016  . Paroxysmal atrial fibrillation (HCC) 09/23/2016  . Status post placement of implantable loop recorder   . Acute blood loss anemia   . Hypoalbuminemia due to protein-calorie malnutrition (HCC)   . Labile blood pressure   . Acute ischemic left MCA stroke (HCC) 09/02/2016  . Ataxia, post-stroke   . Adjustment disorder with mixed anxiety and depressed mood   . Status post right foot surgery   . History of gout   . Primary insomnia   . Acute ischemic stroke (HCC)   . Idiopathic gout   . Morbid obesity (HCC)   . Diastolic dysfunction   . Benign essential HTN   . Diabetes mellitus (HCC)   . Leukocytosis   . CVA (cerebral vascular accident) (HCC) 08/30/2016  . Stroke (cerebrum) (HCC) 08/30/2016  . 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 Hammertoe of right foot 07/20/2016  . Hallux valgus, acquired, bilateral 02/19/2014  . Gout 02/13/2013  . Hyperlipidemia 01/18/2012  . Obesity 01/18/2012    Chase Picket, SPT  12/15/2016, 4:42 PM  [facility] [address] [place], [place], [address] Phone: [phone]   Fax:   [phone]  Name: Aaron Moon MRN: [id number] Date of Birth: 10/07/1945

## 2016-12-17 ENCOUNTER — Encounter: Payer: Self-pay | Admitting: Physical Therapy

## 2016-12-17 ENCOUNTER — Ambulatory Visit: Payer: Federal, State, Local not specified - PPO | Admitting: Occupational Therapy

## 2016-12-17 ENCOUNTER — Ambulatory Visit: Payer: Federal, State, Local not specified - PPO | Admitting: Physical Therapy

## 2016-12-17 VITALS — BP 120/80

## 2016-12-17 DIAGNOSIS — I69351 Hemiplegia and hemiparesis following cerebral infarction affecting right dominant side: Secondary | ICD-10-CM

## 2016-12-17 DIAGNOSIS — R278 Other lack of coordination: Secondary | ICD-10-CM

## 2016-12-17 DIAGNOSIS — M6281 Muscle weakness (generalized): Secondary | ICD-10-CM

## 2016-12-17 DIAGNOSIS — R2681 Unsteadiness on feet: Secondary | ICD-10-CM

## 2016-12-17 DIAGNOSIS — R2689 Other abnormalities of gait and mobility: Secondary | ICD-10-CM

## 2016-12-17 DIAGNOSIS — R41840 Attention and concentration deficit: Secondary | ICD-10-CM

## 2016-12-17 NOTE — Patient Instructions (Signed)
Memory Compensation Strategies  1. Use "WARM" strategy.  W= write it down  A= associate it  R= repeat it  M= make a mental note  2.   You can keep a Memory Notebook.  Use a 3-ring notebook with sections for the following: calendar, important names and phone numbers,  medications, doctors' names/phone numbers, lists/reminders, and a section to journal what you did each day.   3.    Use a calendar to write appointments down.  4.    Write yourself a schedule for the day.  This can be placed on the calendar or in a separate section of the Memory Notebook.  Keeping a regular schedule can help memory.  5.    Use medication organizer with sections for each day or morning/evening pills.  You may need help loading it  6.    Keep a basket, or pegboard by the door.  Place items that you need to take out with you in the basket or on the pegboard.  You may also want to  include a message board for reminders.  7.    Use sticky notes.  Place sticky notes with reminders in a place where the task is performed.  For example: " turn off the  stove" placed by the stove, "lock the door" placed on the door at eye level, " take your medications" on  the bathroom mirror or by the place where you normally take your medications.  8.    Use alarms/timers.  Use while cooking to remind yourself to check on food or as a reminder to take your medicine, or as a  reminder to make a call, or as a reminder to perform another task, etc. 1. Grip Strengthening (Resistive Putty)   Squeeze putty using thumb and all fingers. Repeat _20___ times. Do __2__ sessions per day.   2. Roll putty into tube on table and pinch between each finger and thumb x 10 reps each. (can do ring and small finger together)     Copyright  V[employer]. All rights reserved.    

## 2016-12-17 NOTE — Therapy (Signed)
[facility] [address] [place], [place], [address] Phone: [phone]   Fax:  [phone]  Physical Therapy Treatment  Patient Details  Name: Aaron SouDana Moon MRN: [id number] Date of Birth: 1946-02-17 Referring Provider: Claudette LawsAndrew Kirsteins MD   Encounter Date: 12/17/2016      PT End of Session - 12/17/16 0807    Visit Number 4   Number of Visits 18   Date for PT Re-Evaluation 02/05/17   Authorization Type Federal [employer]    PT Start Time 0804   PT Stop Time 0845   PT Time Calculation (min) 41 min   Equipment Utilized During Treatment Gait belt   Activity Tolerance Patient tolerated treatment well   Behavior During Therapy [facility] for tasks assessed/performed      Past Medical History:  Diagnosis Date  . Arthritis   . Atrial fibrillation (HCC)   . Diabetes mellitus without complication (HCC)   . Gout   . Hyperlipidemia   . Obesity   . Stroke [facility](HCC)     Past Surgical History:  Procedure Laterality Date  . ABDOMINAL SURGERY     colostomy & colostomy reversal  . ANKLE SURGERY    . EP IMPLANTABLE DEVICE N/A 09/02/2016   Procedure: Loop Recorder Insertion;  Surgeon: Duke SalviaSteven C Klein, MD;  [facility];  Service: Cardiovascular;  Laterality: N/A;  . TEE WITHOUT CARDIOVERSION N/A 09/02/2016   Procedure: TRANSESOPHAGEAL ECHOCARDIOGRAM (TEE);  Surgeon: Laurey Moralealton S McLean, MD;  [facility];  Service: Cardiovascular;  Laterality: N/A;    Vitals:   12/17/16 0805  BP: 120/80        Subjective Assessment - 12/17/16 0805    Subjective Feels his gout is improving since last visit. No falls to report.    Limitations Lifting;Standing;Walking;House hold activities   Patient Stated Goals to walk better/with more ease    Currently in Pain? Yes   Pain Score 2    Pain Location Foot   Pain Orientation Right;Left   Pain Descriptors / Indicators Numbness   Pain Type Chronic pain;Neuropathic pain   Pain  Onset More than a month ago   Pain Frequency Constant   Aggravating Factors  increased activity   Pain Relieving Factors rest, medication              [facility] Adult PT Treatment/Exercise - 12/17/16 0831      High Level Balance   High Level Balance Activities Marching forwards;Marching backwards;Tandem walking  tandem fwd/bwd, toe walk fwd/bwd, heel walk fwd/bwd   High Level Balance Comments blue mat in parallel bars: 3 laps each with light to no UE support for marching and tandem, bil heavy UE reliance with toe and heel walking.              Balance Exercises - 12/17/16 0810      Balance Exercises: Standing   Rockerboard Anterior/posterior;Lateral;Head turns;EO;EC;30 seconds;10 reps   Balance Beam standing with feet across blue foam balance beam: forward stepping to floor and back onto foam x 10 reps each leg, backward stepping to floor and back onto foam x 10 reps each leg, min guard to min assist with light UE support on parallel bars. cues for larger steps with increased weight shifting.      Balance Exercises: Standing   Rebounder Limitations performed both ways on balance board: EC rocking board with emphasis on tall posture; holding board steady with EC no head movements, progressing to EC with head movements left<>right and  up<>down. Intermittent UE touch to bars for balance with min guard to min assist for balance. cues on posture, stance position and weight shifting to assist balance. ;             PT Short Term Goals - 12/08/16 1550      PT SHORT TERM GOAL #1   Title Patient will verbalize understanding of initial HEP. (TARGET DATE: 01/08/2017)   Time 4   Period Weeks   Status New     PT SHORT TERM GOAL #2   Title Patient will have a TUG score </=13.5 to indicate a decrease in his risk of falling. (TARGET DATE: 01/08/2017)    Time 4   Period Weeks   Status New     PT SHORT TERM GOAL #3   Title Patient will demonstrate ability to stand on LLE without UE  support for >/=5 seconds to indicate a decrease in his risk of falling. (TARGET DATE: 01/08/2017)    Time 4   Period Weeks   Status New     PT SHORT TERM GOAL #4   Title Patient will demonstrate ability to ascend/descend 4 stairs with unilateral UE support to indicate a decrease in his risk of falling when ambulating on stairs in the community. (TARGET DATE: 01/08/2017)    Time 4   Period Weeks   Status New           PT Long Term Goals - 12/15/16 1639      PT LONG TERM GOAL #1   Title Patient will verbalize understanding of onogoing HEP. (TARGET DATE: 02/05/2017)    Time 8   Period Weeks   Status New     PT LONG TERM GOAL #2   Title PT will perform 6 minute walk test (without an assistive device) and goal will be set to indicate an increase in patient's endurance. (TARGET DATE: 02/05/2017)    Time 8   Period Weeks   Status New     PT LONG TERM GOAL #3   Title Patient will score >/= 20/30 on the FGA (without an assistive device) to indicate a decrease in the patient's risk of falling. (TARGET DATE: 02/05/2017)    Baseline FGA = 12/30 (performed 12/10/2016)   Time 8   Period Weeks   Status New     PT LONG TERM GOAL #4   Title Patient will have a gait velocity of >/= 2.6ft/s without an assistive device to indicate a community ambulator. (TARGET DATE: 02/05/2017)    Time 8   Period Weeks   Status New     PT LONG TERM GOAL #5   Title Patient will demonstrate ability to independently ambulate 500 feet outdoors (including ramps/curbs/grass) without an assistive device to indicate a decrease in his risk of falling when out in the community. (TARGET DATE: 02/05/2017)    Time 8   Period Weeks   Status New     PT LONG TERM GOAL #6   Title Patient will score >/= 52/56 on the Berg Balance Test to indicate a decrease in his risk of falling. (TARGET DATE: 02/05/2017)    Time 8   Period Weeks   Status New           Plan - 12/17/16 0807    Clinical Impression Statement today's  skilled session focused on balance with emphasis on compiant surfaces. Pt continues to need increased assistance/UE support when challenged by complaint or single leg stance. Pt  should benefit from continued  PT to progress toward umet goals.   Rehab Potential Good   Clinical Impairments Affecting Rehab Potential mild cognitive and memory deficits, bunion surgery, arthritis, atrial fibrillation, DM type 2, gout, obesity, colostomy & reversal, hyperlipidemia    PT Frequency 2x / week   PT Duration 8 weeks   PT Treatment/Interventions ADLs/Self Care Home Management;Electrical Stimulation;Ultrasound;Cognitive remediation;Neuromuscular re-education;Balance training;Therapeutic exercise;Therapeutic activities;Functional mobility training;Stair training;Gait training;DME Instruction;Patient/family education;Manual techniques;Passive range of motion   PT Next Visit Plan advance balance activities with emphasis on weight shifting onto RLE    Consulted and Agree with Plan of Care Patient      Patient will benefit from skilled therapeutic intervention in order to improve the following deficits and impairments:  Abnormal gait, Cardiopulmonary status limiting activity, Decreased activity tolerance, Decreased balance, Decreased cognition, Decreased coordination, Decreased safety awareness, Decreased range of motion, Decreased mobility, Decreased knowledge of use of DME, Decreased endurance, Decreased strength, Difficulty walking, Impaired flexibility, Impaired sensation, Postural dysfunction, Pain  Visit Diagnosis: Muscle weakness (generalized)  Unsteadiness on feet  Other abnormalities of gait and mobility  Hemiplegia and hemiparesis following cerebral infarction affecting right dominant side (HCC)  Other lack of coordination     Problem List Patient Active Problem List   Diagnosis Date Noted  . Late effect of cerebrovascular accident (CVA) 09/30/2016  . Paroxysmal atrial fibrillation (HCC)  09/23/2016  . Status post placement of implantable loop recorder   . Acute blood loss anemia   . Hypoalbuminemia due to protein-calorie malnutrition (HCC)   . Labile blood pressure   . Acute ischemic left MCA stroke (HCC) 09/02/2016  . Ataxia, post-stroke   . Adjustment disorder with mixed anxiety and depressed mood   . Status post right foot surgery   . History of gout   . Primary insomnia   . Acute ischemic stroke (HCC)   . Idiopathic gout   . Morbid obesity (HCC)   . Diastolic dysfunction   . Benign essential HTN   . Diabetes mellitus (HCC)   . Leukocytosis   . CVA (cerebral vascular accident) (HCC) 08/30/2016  . Stroke (cerebrum) (HCC) 08/30/2016  . 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 Hammertoe of right foot 07/20/2016  . Hallux valgus, acquired, bilateral 02/19/2014  . Gout 02/13/2013  . Hyperlipidemia 01/18/2012  . Obesity 01/18/2012    Sallyanne Kuster, PTA, [facility] [address], [address] [place], [place] [address] [phone] 12/18/16, 9:40 AM  Name: Aaron Moon MRN: [id number] Date of Birth: 1946-07-30

## 2016-12-17 NOTE — Therapy (Signed)
[facility] [address] [place], [place], [address] Phone: [phone]   Fax:  [phone]  Occupational Therapy Treatment  Patient Details  Name: Aaron Moon MRN: [id number] Date of Birth: 20-Jan-1946 Referring Provider: Dr. Claudette Laws  Encounter Date: 12/17/2016      OT End of Session - 12/17/16 1049    Visit Number 4   Number of Visits 17   Date for OT Re-Evaluation 02/05/17   Authorization Type Federal BC/BS, [place] - G code needed   Authorization - Visit Number 4   Authorization - Number of Visits 10   OT Start Time 0845   OT Stop Time 0930   OT Time Calculation (min) 45 min   Activity Tolerance Patient tolerated treatment well      Past Medical History:  Diagnosis Date  . Arthritis   . Atrial fibrillation (HCC)   . Diabetes mellitus without complication (HCC)   . Gout   . Hyperlipidemia   . Obesity   . Stroke [facility])     Past Surgical History:  Procedure Laterality Date  . ABDOMINAL SURGERY     colostomy & colostomy reversal  . ANKLE SURGERY    . EP IMPLANTABLE DEVICE N/A 09/02/2016   Procedure: Loop Recorder Insertion;  Surgeon: Duke Salvia, MD;  Location: [facility] INVASIVE CV LAB;  Service: Cardiovascular;  Laterality: N/A;  . TEE WITHOUT CARDIOVERSION N/A 09/02/2016   Procedure: TRANSESOPHAGEAL ECHOCARDIOGRAM (TEE);  Surgeon: Laurey Morale, MD;  Location: [facility] ENDOSCOPY;  Service: Cardiovascular;  Laterality: N/A;    There were no vitals filed for this visit.      Subjective Assessment - 12/17/16 0857    Subjective  The gout in my Lt hand has gone down   Pertinent History Lt MCA CVA (08/30/16) PMH: HTN, A-fib, DM, OA   Patient Stated Goals to get my Rt arm stronger   Currently in Pain? No/denies                      OT Treatments/Exercises (OP) - 12/17/16 0001      Cognitive Exercises   Other Cognitive Exercises 1 Pt issued memory compensatory strategies and discussed how  to implement. Long discussion re: this and showed pt how to enter/add appointments on phone and get alerts/reminders.      Exercises   Exercises Hand     Hand Exercises   Other Hand Exercises Pt issued putty HEP for Rt hand and demo use. Pt already has green putty at home therefore did not issue putty - see pt instructions for details                OT Education - 12/17/16 0917    Education provided Yes   Education Details memory compensatory strategies, putty HEP   Person(s) Educated Patient   Methods Explanation;Demonstration;Handout;Verbal cues   Comprehension Verbalized understanding;Returned demonstration          OT Short Term Goals - 12/17/16 1050      OT SHORT TERM GOAL #1   Title Independent with coordination and putty HEP (Due 01/08/17)   Time 4   Period Weeks   Status On-going     OT SHORT TERM GOAL #2   Title Pt to improve functional use of RUE as evidenced by performing 45 blocks on Blocks & Box test   Baseline eval = 39 (Lt = 49)    Time 4   Period Weeks   Status New  OT SHORT TERM GOAL #3   Title Pt to write paragraph maintaining 90% or greater legibility   Baseline eval: sentence at 75% legibility   Time 4   Period Weeks   Status On-going           OT Long Term Goals - 12/08/16 1244      OT LONG TERM GOAL #1   Title Independent with RUE strengthening HEP for strength/endurance (due by 02/05/17)    Time --  6-8 weeks for all LTG's   Status New     OT LONG TERM GOAL #2   Title Improve coordination Rt hand as evidenced by performing 9 hole peg test in 30 sec. or less   Baseline eval = 36.56 sec   Status New     OT LONG TERM GOAL #3   Title Pt to verbalize understanding of memory compensatory strategies and how to implement at home/work   Status New               Plan - 12/17/16 1050    Clinical Impression Statement Pt progressing towards goals, however pt attempts to cover up cognitive deficits when reviewing strategies  with patient.    Rehab Potential Good   OT Frequency 2x / week   OT Duration 8 weeks   OT Treatment/Interventions Self-care/ADL training;DME and/or AE instruction;Patient/family education;Therapeutic exercises;Therapeutic activities;Neuromuscular education;Functional Mobility Training;Passive range of motion;Cognitive remediation/compensation;Visual/perceptual remediation/compensation;Manual Therapy   Plan shoulder HEP, review coordination HEP    OT Home Exercise Plan Education provided:  12/10/16  Coordination HEP, green putty HEP   Consulted and Agree with Plan of Care Patient      Patient will benefit from skilled therapeutic intervention in order to improve the following deficits and impairments:  Decreased coordination, Decreased endurance, Decreased safety awareness, Decreased activity tolerance, Decreased knowledge of precautions, Impaired UE functional use, Decreased mobility, Decreased strength, Decreased cognition, Impaired perceived functional ability  Visit Diagnosis: Muscle weakness (generalized)  Attention and concentration deficit  Hemiplegia and hemiparesis following cerebral infarction affecting right dominant side [facility](HCC)    Problem List Patient Active Problem List   Diagnosis Date Noted  . Late effect of cerebrovascular accident (CVA) 09/30/2016  . Paroxysmal atrial fibrillation (HCC) 09/23/2016  . Status post placement of implantable loop recorder   . Acute blood loss anemia   . Hypoalbuminemia due to protein-calorie malnutrition (HCC)   . Labile blood pressure   . Acute ischemic left MCA stroke (HCC) 09/02/2016  . Ataxia, post-stroke   . Adjustment disorder with mixed anxiety and depressed mood   . Status post right foot surgery   . History of gout   . Primary insomnia   . Acute ischemic stroke (HCC)   . Idiopathic gout   . Morbid obesity (HCC)   . Diastolic dysfunction   . Benign essential HTN   . Diabetes mellitus (HCC)   . Leukocytosis   . CVA (cerebral  vascular accident) (HCC) 08/30/2016  . Stroke (cerebrum) (HCC) 08/30/2016  . 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 Hammertoe of right foot 07/20/2016  . Hallux valgus, acquired, bilateral 02/19/2014  . Gout 02/13/2013  . Hyperlipidemia 01/18/2012  . Obesity 01/18/2012    Kelli ChurnBallie, Averil Digman Johnson, OTR/L 12/17/2016, 10:52 AM  [facility] [address] [place], [place], [address] Phone: [phone]   Fax:  [phone]  Name: Aaron Moon MRN: [id number] Date of Birth: 1945/11/16

## 2016-12-18 LAB — CUP PACEART REMOTE DEVICE CHECK
Date Time Interrogation Session: 20180424223700
MDC IDC PG IMPLANT DT: 20180124

## 2016-12-21 ENCOUNTER — Ambulatory Visit (INDEPENDENT_AMBULATORY_CARE_PROVIDER_SITE_OTHER): Payer: Federal, State, Local not specified - PPO | Admitting: Podiatry

## 2016-12-21 ENCOUNTER — Encounter: Payer: Self-pay | Admitting: Podiatry

## 2016-12-21 DIAGNOSIS — M79676 Pain in unspecified toe(s): Secondary | ICD-10-CM

## 2016-12-21 DIAGNOSIS — B351 Tinea unguium: Secondary | ICD-10-CM | POA: Diagnosis not present

## 2016-12-21 DIAGNOSIS — E0842 Diabetes mellitus due to underlying condition with diabetic polyneuropathy: Secondary | ICD-10-CM

## 2016-12-22 ENCOUNTER — Encounter: Payer: Self-pay | Admitting: Physical Therapy

## 2016-12-22 ENCOUNTER — Ambulatory Visit: Payer: Federal, State, Local not specified - PPO | Admitting: Physical Therapy

## 2016-12-22 ENCOUNTER — Telehealth: Payer: Self-pay | Admitting: Podiatry

## 2016-12-22 ENCOUNTER — Ambulatory Visit: Payer: Federal, State, Local not specified - PPO | Admitting: Occupational Therapy

## 2016-12-22 DIAGNOSIS — R2689 Other abnormalities of gait and mobility: Secondary | ICD-10-CM

## 2016-12-22 DIAGNOSIS — M6281 Muscle weakness (generalized): Secondary | ICD-10-CM

## 2016-12-22 DIAGNOSIS — R278 Other lack of coordination: Secondary | ICD-10-CM

## 2016-12-22 DIAGNOSIS — R2681 Unsteadiness on feet: Secondary | ICD-10-CM

## 2016-12-22 NOTE — Therapy (Signed)
[facility] [address] [place], [place], [address] Phone: [phone]   Fax:  [phone]  Physical Therapy Treatment  Patient Details  Name: Aaron Moon MRN: [id number] Date of Birth: 09-15-1945 Referring Provider: Claudette Laws MD   Encounter Date: 12/22/2016      PT End of Session - 12/22/16 1022    Visit Number 5   Number of Visits 18   Date for PT Re-Evaluation 02/05/17   Authorization Type Federal BCBS    PT Start Time 0930   PT Stop Time 1015   PT Time Calculation (min) 45 min   Equipment Utilized During Treatment Gait belt   Activity Tolerance Patient tolerated treatment well   Behavior During Therapy [facility] for tasks assessed/performed      Past Medical History:  Diagnosis Date  . Arthritis   . Atrial fibrillation (HCC)   . Diabetes mellitus without complication (HCC)   . Gout   . Hyperlipidemia   . Obesity   . Stroke [facility])     Past Surgical History:  Procedure Laterality Date  . ABDOMINAL SURGERY     colostomy & colostomy reversal  . ANKLE SURGERY    . EP IMPLANTABLE DEVICE N/A 09/02/2016   Procedure: Loop Recorder Insertion;  Surgeon: Duke Salvia, MD;  Location: [facility] INVASIVE CV LAB;  Service: Cardiovascular;  Laterality: N/A;  . TEE WITHOUT CARDIOVERSION N/A 09/02/2016   Procedure: TRANSESOPHAGEAL ECHOCARDIOGRAM (TEE);  Surgeon: Laurey Morale, MD;  Location: [facility] ENDOSCOPY;  Service: Cardiovascular;  Laterality: N/A;    There were no vitals filed for this visit.      Subjective Assessment - 12/22/16 0935    Subjective Patient reports his gout is improving and his hand function has improved. Patient denies any new complaints, changes or falls.    Limitations Lifting;Standing;Walking;House hold activities   Patient Stated Goals to walk better/with more ease    Currently in Pain? Yes   Pain Score 2    Pain Location Toe (Comment which one)  bilateral    Pain Type Neuropathic  pain;Chronic pain   Pain Onset More than a month ago   Pain Frequency Intermittent   Aggravating Factors  increased activity    Pain Relieving Factors rest                         [facility] Adult PT Treatment/Exercise - 12/22/16 0930      Transfers   Transfers Sit to Stand;Stand to Sit;Stand Pivot Transfers   Sit to Stand 5: Supervision   Stand to Sit 5: Supervision   Stand to Sit Details (indicate cue type and reason) Verbal cues for precautions/safety   Stand to Sit Details requires cueing to back legs up to chair and reach for armrest prior to descending when fatigued     Ambulation/Gait   Ambulation/Gait Yes   Ambulation/Gait Assistance 5: Supervision;4: Min guard;4: Min assist   Ambulation/Gait Assistance Details Requires supervision on level, indoor surfaces. Requires min guard to min A on ramps/curbs/grass/gravel. Requires cueing for foot clearance in grass/mulch.   Ambulation Distance (Feet) 200 Feet  indoor/outdoor surfaces   Assistive device None   Gait Pattern Step-to pattern;Decreased arm swing - right;Decreased step length - left;Decreased stance time - right;Decreased stride length;Decreased dorsiflexion - right;Decreased weight shift to right;Trendelenburg;Trunk flexed;Abducted - left;Poor foot clearance - right   Ambulation Surface Level;Unlevel;Indoor;Outdoor;Paved;Gravel;Grass     Neuro Re-ed    Neuro Re-ed Details  In parallel  bars: on compliant surface (red mat) forward heel/toe walking with min guard from PT. Patient requires cueing for technique and moderate BUE support on parallel bars. In gravel: forward/backward marching and side stepping. Requires min A with intermittent mod A due to LOB. Forward stepping into mulch. Performed bilaterally. Completed 2 reps.             Balance Exercises - 12/22/16 0930      Balance Exercises: Standing   Rockerboard Anterior/posterior;Lateral;Head turns;EO;EC;10 seconds  requires tactile cueing with  occasional min A due to LOB             PT Short Term Goals - 12/08/16 1550      PT SHORT TERM GOAL #1   Title Patient will verbalize understanding of initial HEP. (TARGET DATE: 01/08/2017)   Time 4   Period Weeks   Status New     PT SHORT TERM GOAL #2   Title Patient will have a TUG score </=13.5 to indicate a decrease in his risk of falling. (TARGET DATE: 01/08/2017)    Time 4   Period Weeks   Status New     PT SHORT TERM GOAL #3   Title Patient will demonstrate ability to stand on LLE without UE support for >/=5 seconds to indicate a decrease in his risk of falling. (TARGET DATE: 01/08/2017)    Time 4   Period Weeks   Status New     PT SHORT TERM GOAL #4   Title Patient will demonstrate ability to ascend/descend 4 stairs with unilateral UE support to indicate a decrease in his risk of falling when ambulating on stairs in the community. (TARGET DATE: 01/08/2017)    Time 4   Period Weeks   Status New           PT Long Term Goals - 12/15/16 1639      PT LONG TERM GOAL #1   Title Patient will verbalize understanding of onogoing HEP. (TARGET DATE: 02/05/2017)    Time 8   Period Weeks   Status New     PT LONG TERM GOAL #2   Title PT will perform 6 minute walk test (without an assistive device) and goal will be set to indicate an increase in patient's endurance. (TARGET DATE: 02/05/2017)    Time 8   Period Weeks   Status New     PT LONG TERM GOAL #3   Title Patient will score >/= 20/30 on the FGA (without an assistive device) to indicate a decrease in the patient's risk of falling. (TARGET DATE: 02/05/2017)    Baseline FGA = 12/30 (performed 12/10/2016)   Time 8   Period Weeks   Status New     PT LONG TERM GOAL #4   Title Patient will have a gait velocity of >/= 2.4562ft/s without an assistive device to indicate a community ambulator. (TARGET DATE: 02/05/2017)    Time 8   Period Weeks   Status New     PT LONG TERM GOAL #5   Title Patient will demonstrate ability to  independently ambulate 500 feet outdoors (including ramps/curbs/grass) without an assistive device to indicate a decrease in his risk of falling when out in the community. (TARGET DATE: 02/05/2017)    Time 8   Period Weeks   Status New     PT LONG TERM GOAL #6   Title Patient will score >/= 52/56 on the Berg Balance Test to indicate a decrease in his risk of falling. (TARGET DATE:  02/05/2017)    Time 8   Period Weeks   Status New               Plan - 12/22/16 1025    Clinical Impression Statement Today's skilled PT session focused on advancing the patient's dynamic balance activities on the rockerboard and compliant surfaces. Patient requires increased cueing for foot clearance due to fatigue as PT session progressed, and patient required multiple seated rest breaks. Patient is making progress towards goals, and will benefit from continued skilled PT to address functional mobility deficits.    Rehab Potential Good   Clinical Impairments Affecting Rehab Potential mild cognitive and memory deficits, bunion surgery, arthritis, atrial fibrillation, DM type 2, gout, obesity, colostomy & reversal, hyperlipidemia    PT Frequency 2x / week   PT Duration 8 weeks   PT Treatment/Interventions ADLs/Self Care Home Management;Electrical Stimulation;Ultrasound;Cognitive remediation;Neuromuscular re-education;Balance training;Therapeutic exercise;Therapeutic activities;Functional mobility training;Stair training;Gait training;DME Instruction;Patient/family education;Manual techniques;Passive range of motion   PT Next Visit Plan advance balance activities on compliant surfaces including outdoor surfaces    Consulted and Agree with Plan of Care Patient      Patient will benefit from skilled therapeutic intervention in order to improve the following deficits and impairments:  Abnormal gait, Cardiopulmonary status limiting activity, Decreased activity tolerance, Decreased balance, Decreased cognition,  Decreased coordination, Decreased safety awareness, Decreased range of motion, Decreased mobility, Decreased knowledge of use of DME, Decreased endurance, Decreased strength, Difficulty walking, Impaired flexibility, Impaired sensation, Postural dysfunction, Pain  Visit Diagnosis: Muscle weakness (generalized)  Unsteadiness on feet  Other abnormalities of gait and mobility  Other lack of coordination     Problem List Patient Active Problem List   Diagnosis Date Noted  . Late effect of cerebrovascular accident (CVA) 09/30/2016  . Paroxysmal atrial fibrillation (HCC) 09/23/2016  . Status post placement of implantable loop recorder   . Acute blood loss anemia   . Hypoalbuminemia due to protein-calorie malnutrition (HCC)   . Labile blood pressure   . Acute ischemic left MCA stroke (HCC) 09/02/2016  . Ataxia, post-stroke   . Adjustment disorder with mixed anxiety and depressed mood   . Status post right foot surgery   . History of gout   . Primary insomnia   . Acute ischemic stroke (HCC)   . Idiopathic gout   . Morbid obesity (HCC)   . Diastolic dysfunction   . Benign essential HTN   . Diabetes mellitus (HCC)   . Leukocytosis   . CVA (cerebral vascular accident) (HCC) 08/30/2016  . Stroke (cerebrum) (HCC) 08/30/2016  . 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 Hammertoe of right foot 07/20/2016  . Hallux valgus, acquired, bilateral 02/19/2014  . Gout 02/13/2013  . Hyperlipidemia 01/18/2012  . Obesity 01/18/2012    Chase Picket, SPT  12/22/2016, 10:27 AM  [facility] [address] [place], [place], [address] Phone: [phone]   Fax:  [phone]  Name: Aaron Moon MRN: [id number] Date of Birth: 1945/09/22

## 2016-12-22 NOTE — Patient Instructions (Signed)
  Strengthening: Resisted Flexion   Hold tubing with __Rt___ arm(s) at side. Pull forward and up. Move shoulder through pain-free range of motion. Repeat __10__ times per set.  Do _1-2_ sessions per day , every other day   Strengthening: Resisted Extension   Hold tubing in __Rt___ hand(s), arm forward. Pull arm back, elbow straight. Repeat _10___ times per set. Do _1-2___ sessions per day, every other day.   Resisted Horizontal Abduction: Bilateral   Sit or stand, tubing in both hands, arms out in front. Keeping arms straight, pinch shoulder blades together and stretch arms out. Repeat _10___ times per set. Do _1-2___ sessions per day, every other day.   Elbow Flexion: Resisted   With tubing held in ___Rt___ hand(s) and other end secured under foot, curl arm up as far as possible. Repeat _10___ times per set. Do _1-2___ sessions per day, every other day.    Elbow Extension: Resisted   Sit in chair with resistive band secured with Lt hand holding band at Lt shoulder, and __Rt_____ elbow bent holding bottom end. Straighten Rt elbow. Repeat _10___ times per set.  Do _1-2___ sessions per day, every other day.

## 2016-12-22 NOTE — Therapy (Signed)
[facility] [address] [place], [place], [address] Phone: [phone]   Fax:  [phone]  Occupational Therapy Treatment  Patient Details  Name: Aaron Moon MRN: [id number] Date of Birth: [birth year] Referring Provider: Dr. Alysia Penna  Encounter Date: 12/22/2016      OT End of Session - 12/22/16 1030    Visit Number 5   Number of Visits 17   Date for OT Re-Evaluation 02/05/17   Authorization Type Federal BC/BS, [place] - G code needed   Authorization - Visit Number 5   Authorization - Number of Visits 10   OT Start Time [phone]   OT Stop Time 0930   OT Time Calculation (min) 40 min   Activity Tolerance Patient tolerated treatment well      Past Medical History:  Diagnosis Date  . Arthritis   . Atrial fibrillation ([place])   . Diabetes mellitus without complication ([place])   . Gout   . Hyperlipidemia   . Obesity   . Stroke [facility])     Past Surgical History:  Procedure Laterality Date  . ABDOMINAL SURGERY     colostomy & colostomy reversal  . ANKLE SURGERY    . EP IMPLANTABLE DEVICE N/A 09/02/2016   Procedure: Loop Recorder Insertion;  Surgeon: Deboraha Sprang, MD;  Location: [place] CV LAB;  Service: Cardiovascular;  Laterality: N/A;  . TEE WITHOUT CARDIOVERSION N/A 09/02/2016   Procedure: TRANSESOPHAGEAL ECHOCARDIOGRAM (TEE);  Surgeon: Larey Dresser, MD;  Location: [place];  Service: Cardiovascular;  Laterality: N/A;    There were no vitals filed for this visit.      Subjective Assessment - 12/22/16 0857    Subjective  The gout is gone in my Lt hand   Pertinent History Lt MCA CVA (08/30/16) PMH: HTN, A-fib, DM, OA   Patient Stated Goals to get my Rt arm stronger   Currently in Pain? No/denies                      OT Treatments/Exercises (OP) - 12/22/16 0001      Exercises   Exercises Shoulder     Shoulder Exercises: ROM/Strengthening   Other ROM/Strengthening Exercises Pt  issued theraband HEP and demo each x 10 reps - see pt instructions for details     Fine Motor Coordination   Fine Motor Coordination Handwriting   Handwriting Practiced writing 2 sentences with regular pen at approx 75% legibility. Pt wrote with built up pen with incr. legibility up to 90% legibility. Issued tan foam to build up pen   Other Fine Motor Exercises Reviewed coordination HEP                OT Education - 12/22/16 0908    Education provided Yes   Education Details Theraband HEP    Person(s) Educated Patient   Methods Explanation;Demonstration;Handout   Comprehension Verbalized understanding;Returned demonstration          OT Short Term Goals - 12/22/16 1030      OT SHORT TERM GOAL #1   Title Independent with coordination and putty HEP (Due 01/08/17)   Time 4   Period Weeks   Status Achieved     OT SHORT TERM GOAL #2   Title Pt to improve functional use of RUE as evidenced by performing 45 blocks on Blocks & Box test   Baseline eval = 39 (Lt = 49)    Time 4   Period Weeks   Status  On-going     OT SHORT TERM GOAL #3   Title Pt to write paragraph maintaining 90% or greater legibility   Baseline eval: sentence at 75% legibility   Time 4   Period Weeks   Status On-going           OT Long Term Goals - 12/22/16 1034      OT LONG TERM GOAL #1   Title Independent with RUE strengthening HEP for strength/endurance (due by 02/05/17)    Time --  6-8 weeks for all LTG's   Status On-going     OT LONG TERM GOAL #2   Title Improve coordination Rt hand as evidenced by performing 9 hole peg test in 30 sec. or less   Baseline eval = 36.56 sec   Status New     OT LONG TERM GOAL #3   Title Pt to verbalize understanding of memory compensatory strategies and how to implement at home/work   [place] - 12/22/16 1031    Clinical Impression Statement Pt met STG #1 and progressing towards remaining STG's.    Rehab Potential Good   OT  Frequency 2x / week   OT Duration 8 weeks   OT Treatment/Interventions Self-care/ADL training;DME and/or AE instruction;Patient/family education;Therapeutic exercises;Therapeutic activities;Neuromuscular education;Functional Mobility Training;Passive range of motion;Cognitive remediation/compensation;Visual/perceptual remediation/compensation;Manual Therapy   Plan Review theraband HEP, Functional reaching and coordination RUE   OT Home Exercise Plan Education provided:  12/10/16  Coordination HEP, green putty HEP. 12/17/16: memory strategies, putty HEP. 12/22/16: Theraband HEP    Consulted and Agree with Plan of Care Patient      Patient will benefit from skilled therapeutic intervention in order to improve the following deficits and impairments:  Decreased coordination, Decreased endurance, Decreased safety awareness, Decreased activity tolerance, Decreased knowledge of precautions, Impaired UE functional use, Decreased mobility, Decreased strength, Decreased cognition, Impaired perceived functional ability  Visit Diagnosis: Muscle weakness (generalized)  Other lack of coordination    Problem List Patient Active Problem List   Diagnosis Date Noted  . Late effect of cerebrovascular accident (CVA) 09/30/2016  . Paroxysmal atrial fibrillation ([place]) 09/23/2016  . Status post placement of implantable loop recorder   . Acute blood loss anemia   . Hypoalbuminemia due to protein-calorie malnutrition ([place])   . Labile blood pressure   . Acute ischemic left MCA stroke ([place]) 09/02/2016  . Ataxia, post-stroke   . Adjustment disorder with mixed anxiety and depressed mood   . Status post right foot surgery   . History of gout   . Primary insomnia   . Acute ischemic stroke ([place])   . Idiopathic gout   . Morbid obesity ([place])   . Diastolic dysfunction   . Benign essential HTN   . Diabetes mellitus ([place])   . Leukocytosis   . CVA (cerebral vascular accident) ([place]) 08/30/2016  . Stroke (cerebrum)  ([place]) 08/30/2016  . 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 Hammertoe of right foot 07/20/2016  . Hallux valgus, acquired, bilateral 02/19/2014  . Gout 02/13/2013  . Hyperlipidemia 01/18/2012  . Obesity 01/18/2012    Carey Bullocks, OTR/L 12/22/2016, 10:35 AM  [facility] [address] [place], [place], [address] Phone: [phone]   Fax:  [phone]  Name: Mabry Tift MRN: [id number] Date of Birth: 1946/07/11

## 2016-12-22 NOTE — Telephone Encounter (Signed)
Tanja with [facility] called wanting to know the status of the plan of care that started 16 August 2016 with Dr. Logan BoresEvans. Their office has never received the signed plan of care and wants to confirm our address to make sure it was not sent to the wrong locations. Stated if we do have the signed plan of care to please fax it to her at [phone]. If not, she requested a call back at [phone].

## 2016-12-22 NOTE — Progress Notes (Signed)
   SUBJECTIVE Patient with a history of diabetes mellitus presents to office today complaining of elongated, thickened nails. Pain while ambulating in shoes. Patient is unable to trim their own nails. Pt reports having a stroke on August 30, 2016. He reports neuropathy of bilateral feet which cause him pain at times.   OBJECTIVE General Patient is awake, alert, and oriented x 3 and in no acute distress. Derm Skin is dry and supple bilateral. Negative open lesions or macerations. Remaining integument unremarkable. Nails are tender, long, thickened and dystrophic with subungual debris, consistent with onychomycosis, 1-5 bilateral. No signs of infection noted. Vasc  DP and PT pedal pulses palpable bilaterally. Temperature gradient within normal limits.  Neuro Epicritic and protective threshold sensation diminished bilaterally.  Musculoskeletal Exam No symptomatic pedal deformities noted bilateral. Muscular strength within normal limits.  ASSESSMENT 1. Diabetes Mellitus w/ peripheral neuropathy 2. Onychomycosis of nail due to dermatophyte bilateral 3. Pain in foot bilateral  PLAN OF CARE 1. Patient evaluated today. 2. Instructed to maintain good pedal hygiene and foot care. Stressed importance of controlling blood sugar.  3. Mechanical debridement of nails 1-5 bilaterally performed using a nail nipper. Filed with dremel without incident.  4. Return to clinic in 3 mos.     Felecia ShellingBrent M. Evans, DPM [facility]  Dr. Felecia ShellingBrent M. Evans, DPM    [address]                                        [place], [place] [address]                Office [phone]  Fax [phone]

## 2016-12-24 ENCOUNTER — Ambulatory Visit: Payer: Federal, State, Local not specified - PPO | Admitting: Physical Therapy

## 2016-12-24 ENCOUNTER — Encounter: Payer: Federal, State, Local not specified - PPO | Attending: Physical Medicine & Rehabilitation

## 2016-12-24 ENCOUNTER — Encounter: Payer: Self-pay | Admitting: Physical Medicine & Rehabilitation

## 2016-12-24 ENCOUNTER — Ambulatory Visit (HOSPITAL_BASED_OUTPATIENT_CLINIC_OR_DEPARTMENT_OTHER): Payer: Federal, State, Local not specified - PPO | Admitting: Physical Medicine & Rehabilitation

## 2016-12-24 ENCOUNTER — Ambulatory Visit: Payer: Federal, State, Local not specified - PPO | Admitting: Occupational Therapy

## 2016-12-24 ENCOUNTER — Encounter: Payer: Self-pay | Admitting: Physical Therapy

## 2016-12-24 VITALS — BP 138/94 | HR 100

## 2016-12-24 DIAGNOSIS — Z933 Colostomy status: Secondary | ICD-10-CM | POA: Insufficient documentation

## 2016-12-24 DIAGNOSIS — I10 Essential (primary) hypertension: Secondary | ICD-10-CM | POA: Insufficient documentation

## 2016-12-24 DIAGNOSIS — R2681 Unsteadiness on feet: Secondary | ICD-10-CM

## 2016-12-24 DIAGNOSIS — M6281 Muscle weakness (generalized): Secondary | ICD-10-CM

## 2016-12-24 DIAGNOSIS — E785 Hyperlipidemia, unspecified: Secondary | ICD-10-CM | POA: Diagnosis not present

## 2016-12-24 DIAGNOSIS — I69392 Facial weakness following cerebral infarction: Secondary | ICD-10-CM | POA: Diagnosis not present

## 2016-12-24 DIAGNOSIS — M109 Gout, unspecified: Secondary | ICD-10-CM | POA: Diagnosis not present

## 2016-12-24 DIAGNOSIS — E1159 Type 2 diabetes mellitus with other circulatory complications: Secondary | ICD-10-CM | POA: Diagnosis not present

## 2016-12-24 DIAGNOSIS — I6930 Unspecified sequelae of cerebral infarction: Secondary | ICD-10-CM | POA: Diagnosis present

## 2016-12-24 DIAGNOSIS — R278 Other lack of coordination: Secondary | ICD-10-CM

## 2016-12-24 DIAGNOSIS — R269 Unspecified abnormalities of gait and mobility: Secondary | ICD-10-CM

## 2016-12-24 DIAGNOSIS — G479 Sleep disorder, unspecified: Secondary | ICD-10-CM | POA: Insufficient documentation

## 2016-12-24 DIAGNOSIS — Z800 Family history of malignant neoplasm of digestive organs: Secondary | ICD-10-CM | POA: Insufficient documentation

## 2016-12-24 DIAGNOSIS — Z87891 Personal history of nicotine dependence: Secondary | ICD-10-CM | POA: Insufficient documentation

## 2016-12-24 DIAGNOSIS — Z820 Family history of epilepsy and other diseases of the nervous system: Secondary | ICD-10-CM | POA: Diagnosis not present

## 2016-12-24 DIAGNOSIS — I69398 Other sequelae of cerebral infarction: Secondary | ICD-10-CM | POA: Diagnosis not present

## 2016-12-24 DIAGNOSIS — R2689 Other abnormalities of gait and mobility: Secondary | ICD-10-CM | POA: Diagnosis not present

## 2016-12-24 DIAGNOSIS — Z9889 Other specified postprocedural states: Secondary | ICD-10-CM | POA: Insufficient documentation

## 2016-12-24 DIAGNOSIS — I69393 Ataxia following cerebral infarction: Secondary | ICD-10-CM | POA: Insufficient documentation

## 2016-12-24 DIAGNOSIS — I69351 Hemiplegia and hemiparesis following cerebral infarction affecting right dominant side: Secondary | ICD-10-CM

## 2016-12-24 NOTE — Therapy (Signed)
[facility] [address] [place], [place], [address] Phone: [phone]   Fax:  [phone]  Occupational Therapy Treatment  Patient Details  Name: Aaron SouDana Moon MRN: [id number] Date of Birth: 01-19-1946 Referring Provider: Dr. Claudette LawsAndrew Kirsteins  Encounter Date: 12/24/2016      OT End of Session - 12/24/16 0841    Visit Number 6   Number of Visits 17   Date for OT Re-Evaluation 02/05/17   Authorization Type Federal BC[place]BS, [place] - G code needed   Authorization - Visit Number 6   Authorization - Number of Visits 10   OT Start Time 0800   OT Stop Time 0845   OT Time Calculation (min) 45 min   Activity Tolerance Patient tolerated treatment well      Past Medical History:  Diagnosis Date  . Arthritis   . Atrial fibrillation (HCC)   . Diabetes mellitus without complication (HCC)   . Gout   . Hyperlipidemia   . Obesity   . Stroke [facility](HCC)     Past Surgical History:  Procedure Laterality Date  . ABDOMINAL SURGERY     colostomy & colostomy reversal  . ANKLE SURGERY    . EP IMPLANTABLE DEVICE N/A 09/02/2016   Procedure: Loop Recorder Insertion;  Surgeon: Duke SalviaSteven C Klein, MD;  [facility];  Service: Cardiovascular;  Laterality: N/A;  . TEE WITHOUT CARDIOVERSION N/A 09/02/2016   Procedure: TRANSESOPHAGEAL ECHOCARDIOGRAM (TEE);  Surgeon: Laurey Moralealton S McLean, MD;  [facility];  Service: Cardiovascular;  Laterality: N/A;    There were no vitals filed for this visit.      Subjective Assessment - 12/24/16 0803    Pertinent History Lt MCA CVA (08/30/16) PMH: HTN, A-fib, DM, OA   Patient Stated Goals to get my Rt arm stronger   Currently in Pain? Yes  neuropathy - OT not addressing   Pain Score 2    Pain Location Foot   Pain Orientation Right;Left                      OT Treatments/Exercises (OP) - 12/24/16 0001      ADLs   ADL Comments Reviewed how to enter appointments in phone      Shoulder Exercises: ROM/Strengthening   UBE (Upper Arm Bike) UBE x 10 min. Level 1 for strength/endurance   Other ROM/Strengthening Exercises Reviewed theraband HEP - Pt demo each x 10 reps     Functional Reaching Activities   High Level High level reaching RUE for ROM/endurance while placing small pegs in pegboard (vertical surface) for coordination while copying peg design: Pt able to complete entire design accurately without rest RUE                  OT Short Term Goals - 12/22/16 1030      OT SHORT TERM GOAL #1   Title Independent with coordination and putty HEP (Due 01/08/17)   Time 4   Period Weeks   Status Achieved     OT SHORT TERM GOAL #2   Title Pt to improve functional use of RUE as evidenced by performing 45 blocks on Blocks & Box test   Baseline eval = 39 (Lt = 49)    Time 4   Period Weeks   Status On-going     OT SHORT TERM GOAL #3   Title Pt to write paragraph maintaining 90% or greater legibility   Baseline eval: sentence at 75% legibility  Time 4   Period Weeks   Status On-going           OT Long Term Goals - 12/24/16 4098      OT LONG TERM GOAL #1   Title Independent with RUE strengthening HEP for strength/endurance (due by 02/05/17)    Time --  6-8 weeks for all LTG's   Status Achieved     OT LONG TERM GOAL #2   Title Improve coordination Rt hand as evidenced by performing 9 hole peg test in 30 sec. or less   Baseline eval = 36.56 sec   Status New     OT LONG TERM GOAL #3   Title Pt to verbalize understanding of memory compensatory strategies and how to implement at home/work   Status On-going               Plan - 12/24/16 0843    Clinical Impression Statement Pt making progress towards goals. Pt demo increased strength/endurance RUE   Rehab Potential Good   OT Frequency 2x / week   OT Duration 8 weeks  anticipate only 4-6 weeks needed   OT Treatment/Interventions Self-care/ADL training;DME and/or AE  instruction;Patient/family education;Therapeutic exercises;Therapeutic activities;Neuromuscular education;Functional Mobility Training;Passive range of motion;Cognitive remediation/compensation;Visual/perceptual remediation/compensation;Manual Therapy   Plan Begin assessing remaining STG's   OT Home Exercise Plan Education provided:  12/10/16  Coordination HEP, green putty HEP. 12/17/16: memory strategies, putty HEP. 12/22/16: Theraband HEP    Consulted and Agree with Plan of Care Patient      Patient will benefit from skilled therapeutic intervention in order to improve the following deficits and impairments:  Decreased coordination, Decreased endurance, Decreased safety awareness, Decreased activity tolerance, Decreased knowledge of precautions, Impaired UE functional use, Decreased mobility, Decreased strength, Decreased cognition, Impaired perceived functional ability  Visit Diagnosis: Muscle weakness (generalized)  Other lack of coordination    Problem List Patient Active Problem List   Diagnosis Date Noted  . Late effect of cerebrovascular accident (CVA) 09/30/2016  . Paroxysmal atrial fibrillation (HCC) 09/23/2016  . Status post placement of implantable loop recorder   . Acute blood loss anemia   . Hypoalbuminemia due to protein-calorie malnutrition (HCC)   . Labile blood pressure   . Acute ischemic left MCA stroke (HCC) 09/02/2016  . Ataxia, post-stroke   . Adjustment disorder with mixed anxiety and depressed mood   . Status post right foot surgery   . History of gout   . Primary insomnia   . Acute ischemic stroke (HCC)   . Idiopathic gout   . Morbid obesity (HCC)   . Diastolic dysfunction   . Benign essential HTN   . Diabetes mellitus (HCC)   . Leukocytosis   . CVA (cerebral vascular accident) (HCC) 08/30/2016  . Stroke (cerebrum) (HCC) 08/30/2016  . 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 Hammertoe of right foot 07/20/2016  . Hallux valgus, acquired,  bilateral 02/19/2014  . Gout 02/13/2013  . Hyperlipidemia 01/18/2012  . Obesity 01/18/2012    Kelli Churn, OTR/L 12/24/2016, 8:45 AM  [facility] [address] [place], [place], [address] Phone: [phone]   Fax:  [phone]  Name: Aaron Moon MRN: [id number] Date of Birth: 09-17-1945

## 2016-12-24 NOTE — Progress Notes (Signed)
Subjective:    Patient ID: Aaron Moon, male    DOB: [birth year], 71 y.o.   MRN: [id number] Left MCA infarct Jan 2018 Right hemi HPI  Good results with therapy OP PT and OT, Has appointments through the end of June Driving ok, no accident or fender benders  Wants to return to work, works at [employer] mainly.  Seen by foot dr who does toenail debridement Weight stable 241lb,  Able to walk from parking deck   Pain Inventory Average Pain 2 Pain Right Now 2 My pain is constant and tingling  In the last 24 hours, has pain interfered with the following? General activity 2 Relation with others 2 Enjoyment of life 2 What TIME of day is your pain at its worst? evening Sleep (in general) Fair  Pain is worse with: sitting and inactivity Pain improves with: therapy/exercise Relief from Meds: 5  Mobility walk without assistance ability to climb steps?  yes do you drive?  yes  Function employed # of hrs/week 40  Neuro/Psych weakness numbness tingling trouble walking  Prior Studies Any changes since last visit?  no  Physicians involved in your care Any changes since last visit?  no   Family History  Problem Relation Age of Onset  . ALS Mother   . Pancreatic cancer Father    Social History   Social History  . Marital status: Single    Spouse name: N/A  . Number of children: N/A  . Years of education: N/A   Social History Main Topics  . Smoking status: Former Games developer  . Smokeless tobacco: Never Used  . Alcohol use No  . Drug use: No  . Sexual activity: No   Other Topics Concern  . Not on file   Social History Narrative  . No narrative on file   Past Surgical History:  Procedure Laterality Date  . ABDOMINAL SURGERY     colostomy & colostomy reversal  . ANKLE SURGERY    . EP IMPLANTABLE DEVICE N/A 09/02/2016   Procedure: Loop Recorder Insertion;  Surgeon: Duke Salvia, MD;  Location: [facility] INVASIVE CV LAB;  Service:  Cardiovascular;  Laterality: N/A;  . TEE WITHOUT CARDIOVERSION N/A 09/02/2016   Procedure: TRANSESOPHAGEAL ECHOCARDIOGRAM (TEE);  Surgeon: Laurey Morale, MD;  [facility]OSCOPY;  Service: Cardiovascular;  Laterality: N/A;   Past Medical History:  Diagnosis Date  . Arthritis   . Atrial fibrillation (HCC)   . Diabetes mellitus without complication (HCC)   . Gout   . Hyperlipidemia   . Obesity   . Stroke [facility])    There were no vitals taken for this visit.  Opioid Risk Score:   Fall Risk Score:  `1  Depression screen PHQ 2/9  Depression screen [facility] 2/9 09/30/2016 07/22/2015  Decreased Interest 0 0  Down, Depressed, Hopeless 0 0  PHQ - 2 Score 0 0  Altered sleeping 0 -  Tired, decreased energy 0 -  Change in appetite 0 -  Feeling bad or failure about yourself  0 -  Trouble concentrating 0 -  Moving slowly or fidgety/restless 0 -  Suicidal thoughts 0 -  PHQ-9 Score 0 -  Difficult doing work/chores Not difficult at all -    Review of Systems  Constitutional: Negative.   HENT: Negative.   Eyes: Negative.   Respiratory: Negative.   Cardiovascular: Negative.   Gastrointestinal: Negative.   Endocrine: Negative.   Genitourinary: Negative.   Musculoskeletal: Negative.   Skin: Negative.  Allergic/Immunologic: Negative.   Neurological: Negative.   Hematological: Negative.   Psychiatric/Behavioral: Negative.   All other systems reviewed and are negative.      Objective:   Physical Exam  Constitutional: He is oriented to person, place, and time. He appears well-developed and well-nourished.  HENT:  Head: Normocephalic and atraumatic.  Eyes: Conjunctivae and EOM are normal. Pupils are equal, round, and reactive to light.  Neck: Normal range of motion.  Musculoskeletal:       Lumbar back: He exhibits decreased range of motion.  Valgus deformity right knee  Ambulates without toe drag or knee instability  Neurological: He is alert and oriented to person, place, and  time.  Psychiatric: He has a normal mood and affect. His behavior is normal. Judgment and thought content normal.  Nursing note and vitals reviewed.  motor strength is 5/5 in left deltoid, bicep, tricep, grip, hip flexor, knee extensor, ankle dorsiflexor, 4/5 in the right deltoid, biceps, triceps, grip 5/5 in the right hip flexor, knee extensor, 3/5. Right ankle dorsiflexor.  Sensation intact to light touch bilateral upper and lower limbs with the exception of feet, which have decreased sensation to pinprick compared to the hands. This goes to mid calf        Assessment & Plan:  1. Left MCA infarct, right hemiparesis generally improving. Still has mild weakness, right upper extremity, does have ankle dorsiflexor weakness but is able to compensate for this. This mainly affects him during longer distance ambulation. Continue outpatient PT, OT. Return to clinic 1 month. Reevaluate for potential return to work at a part-time basis. We will start with 4 hours per day for 2 weeks, then go up to 8 hours per day

## 2016-12-25 NOTE — Therapy (Signed)
[facility] [address] [place], [place], [address] Phone: [phone]   Fax:  [phone]  Physical Therapy Treatment  Patient Details  Name: Aaron SouDana Vallance MRN: [id number] Date of Birth: 03-19-1946 Referring Provider: Claudette LawsAndrew Kirsteins MD   Encounter Date: 12/24/2016     12/24/16 0853  PT Visits / Re-Eval  Visit Number 6  Number of Visits 18  Date for PT Re-Evaluation 02/05/17  Authorization  Authorization Type Federal BCBS   PT Time Calculation  PT Start Time [phone]  PT Stop Time 0930  PT Time Calculation (min) 40 min  PT - End of Session  Equipment Utilized During Treatment Gait belt  Activity Tolerance Patient tolerated treatment well  Behavior [facility] for tasks assessed/performed    Past Medical History:  Diagnosis Date  . Arthritis   . Atrial fibrillation (HCC)   . Diabetes mellitus without complication (HCC)   . Gout   . Hyperlipidemia   . Obesity   . Stroke [facility](HCC)     Past Surgical History:  Procedure Laterality Date  . ABDOMINAL SURGERY     colostomy & colostomy reversal  . ANKLE SURGERY    . EP IMPLANTABLE DEVICE N/A 09/02/2016   Procedure: Loop Recorder Insertion;  Surgeon: Duke SalviaSteven C Klein, MD;  [facility];  Service: Cardiovascular;  Laterality: N/A;  . TEE WITHOUT CARDIOVERSION N/A 09/02/2016   Procedure: TRANSESOPHAGEAL ECHOCARDIOGRAM (TEE);  Surgeon: Laurey Moralealton S McLean, MD;  [facility];  Service: Cardiovascular;  Laterality: N/A;    There were no vitals filed for this visit.     12/24/16 0852  Symptoms/Limitations  Subjective No new complaints. No falls to report. No change in his neuropathy pain.   Limitations Lifting;Standing;Walking;House hold activities  Patient Stated Goals to walk better/with more ease   Pain Assessment  Currently in Pain? Yes  Pain Score 2  Pain Location Foot  Pain Orientation Right;Left  Pain Descriptors / Indicators  Numbness  Pain Type Neuropathic pain;Chronic pain  Pain Onset More than a month ago  Pain Frequency Intermittent  Aggravating Factors  increased activity  Pain Relieving Factors rest      12/24/16 0855  Transfers  Transfers Sit to Stand;Stand to Sit;Stand Pivot Transfers  Sit to Stand 5: Supervision;With upper extremity assist;From chair/3-in-1  Stand to Sit 5: Supervision;With upper extremity assist;To chair/3-in-1  Ambulation/Gait  Ambulation/Gait Yes  Ambulation/Gait Assistance 5: Supervision;4: Min guard  Ambulation/Gait Assistance Details cues for increased foot clearance with gait to decrease scuffting.   Ambulation Distance (Feet) 500 Feet  Assistive device None  Gait Pattern Step-to pattern;Decreased arm swing - right;Decreased step length - left;Decreased stance time - right;Decreased stride length;Decreased dorsiflexion - right;Decreased weight shift to right;Trendelenburg;Trunk flexed;Abducted - left;Poor foot clearance - right  Ambulation Surface Level;Unlevel;Indoor;Outdoor;Paved;Gravel;Grass  High Level Balance  High Level Balance Activities Marching forwards;Marching backwards;Tandem walking (tandem fwd/bwd, toe walk fwd/bwd, heel walk fwd/bwd)  High Level Balance Comments on both red mats next to counter top: single UE support with pt performing 3 laps each with min guard to min assist for balance.          PT Short Term Goals - 12/08/16 1550      PT SHORT TERM GOAL #1   Title Patient will verbalize understanding of initial HEP. (TARGET DATE: 01/08/2017)   Time 4   Period Weeks   Status New     PT SHORT TERM GOAL #2   Title Patient will have a TUG  score </=13.5 to indicate a decrease in his risk of falling. (TARGET DATE: 01/08/2017)    Time 4   Period Weeks   Status New     PT SHORT TERM GOAL #3   Title Patient will demonstrate ability to stand on LLE without UE support for >/=5 seconds to indicate a decrease in his risk of falling. (TARGET DATE: 01/08/2017)     Time 4   Period Weeks   Status New     PT SHORT TERM GOAL #4   Title Patient will demonstrate ability to ascend/descend 4 stairs with unilateral UE support to indicate a decrease in his risk of falling when ambulating on stairs in the community. (TARGET DATE: 01/08/2017)    Time 4   Period Weeks   Status New           PT Long Term Goals - 12/15/16 1639      PT LONG TERM GOAL #1   Title Patient will verbalize understanding of onogoing HEP. (TARGET DATE: 02/05/2017)    Time 8   Period Weeks   Status New     PT LONG TERM GOAL #2   Title PT will perform 6 minute walk test (without an assistive device) and goal will be set to indicate an increase in patient's endurance. (TARGET DATE: 02/05/2017)    Time 8   Period Weeks   Status New     PT LONG TERM GOAL #3   Title Patient will score >/= 20/30 on the FGA (without an assistive device) to indicate a decrease in the patient's risk of falling. (TARGET DATE: 02/05/2017)    Baseline FGA = 12/30 (performed 12/10/2016)   Time 8   Period Weeks   Status New     PT LONG TERM GOAL #4   Title Patient will have a gait velocity of >/= 2.71ft/s without an assistive device to indicate a community ambulator. (TARGET DATE: 02/05/2017)    Time 8   Period Weeks   Status New     PT LONG TERM GOAL #5   Title Patient will demonstrate ability to independently ambulate 500 feet outdoors (including ramps/curbs/grass) without an assistive device to indicate a decrease in his risk of falling when out in the community. (TARGET DATE: 02/05/2017)    Time 8   Period Weeks   Status New     PT LONG TERM GOAL #6   Title Patient will score >/= 52/56 on the Berg Balance Test to indicate a decrease in his risk of falling. (TARGET DATE: 02/05/2017)    Time 8   Period Weeks   Status New        12/24/16 0854  Plan  Clinical Impression Statement Today's skilled session continued to address gait on various surfaces and high level balance activites with only fatigue  reported. Pt is making steady progress toward goals and should benefit from continued PT to progress toward unmet goals.   Pt will benefit from skilled therapeutic intervention in order to improve on the following deficits Abnormal gait;Cardiopulmonary status limiting activity;Decreased activity tolerance;Decreased balance;Decreased cognition;Decreased coordination;Decreased safety awareness;Decreased range of motion;Decreased mobility;Decreased knowledge of use of DME;Decreased endurance;Decreased strength;Difficulty walking;Impaired flexibility;Impaired sensation;Postural dysfunction;Pain  Rehab Potential Good  Clinical Impairments Affecting Rehab Potential mild cognitive and memory deficits, bunion surgery, arthritis, atrial fibrillation, DM type 2, gout, obesity, colostomy & reversal, hyperlipidemia   PT Frequency 2x / week  PT Duration 8 weeks  PT Treatment/Interventions ADLs/Self Care Home Management;Electrical Stimulation;Ultrasound;Cognitive remediation;Neuromuscular re-education;Balance training;Therapeutic exercise;Therapeutic activities;Functional mobility  training;Stair training;Gait training;DME Instruction;Patient/family education;Manual techniques;Passive range of motion  PT Next Visit Plan advance balance activities on compliant surfaces including outdoor surfaces   Consulted and Agree with Plan of Care Patient       Patient will benefit from skilled therapeutic intervention in order to improve the following deficits and impairments:  Abnormal gait, Cardiopulmonary status limiting activity, Decreased activity tolerance, Decreased balance, Decreased cognition, Decreased coordination, Decreased safety awareness, Decreased range of motion, Decreased mobility, Decreased knowledge of use of DME, Decreased endurance, Decreased strength, Difficulty walking, Impaired flexibility, Impaired sensation, Postural dysfunction, Pain  Visit Diagnosis: Muscle weakness (generalized)  Other lack of  coordination  Unsteadiness on feet  Other abnormalities of gait and mobility     Problem List Patient Active Problem List   Diagnosis Date Noted  . Late effect of cerebrovascular accident (CVA) 09/30/2016  . Paroxysmal atrial fibrillation (HCC) 09/23/2016  . Status post placement of implantable loop recorder   . Acute blood loss anemia   . Hypoalbuminemia due to protein-calorie malnutrition (HCC)   . Labile blood pressure   . Acute ischemic left MCA stroke (HCC) 09/02/2016  . Ataxia, post-stroke   . Adjustment disorder with mixed anxiety and depressed mood   . Status post right foot surgery   . History of gout   . Primary insomnia   . Acute ischemic stroke (HCC)   . Idiopathic gout   . Morbid obesity (HCC)   . Diastolic dysfunction   . Benign essential HTN   . Diabetes mellitus (HCC)   . Leukocytosis   . CVA (cerebral vascular accident) (HCC) 08/30/2016  . Stroke (cerebrum) (HCC) 08/30/2016  . 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 Hammertoe of right foot 07/20/2016  . Hallux valgus, acquired, bilateral 02/19/2014  . Gout 02/13/2013  . Hyperlipidemia 01/18/2012  . Obesity 01/18/2012    Sallyanne Kuster, PTA, [facility] [address] [place], [place] [address] [phone] 12/25/16, 6:30 PM   Name: Sven Pinheiro MRN: [id number] Date of Birth: [birth year]

## 2016-12-28 ENCOUNTER — Ambulatory Visit: Payer: Federal, State, Local not specified - PPO | Admitting: Occupational Therapy

## 2016-12-28 ENCOUNTER — Ambulatory Visit: Payer: Federal, State, Local not specified - PPO | Admitting: Physical Therapy

## 2016-12-28 ENCOUNTER — Encounter: Payer: Self-pay | Admitting: Physical Therapy

## 2016-12-28 DIAGNOSIS — R278 Other lack of coordination: Secondary | ICD-10-CM

## 2016-12-28 DIAGNOSIS — I69351 Hemiplegia and hemiparesis following cerebral infarction affecting right dominant side: Secondary | ICD-10-CM

## 2016-12-28 DIAGNOSIS — M6281 Muscle weakness (generalized): Secondary | ICD-10-CM

## 2016-12-28 DIAGNOSIS — R2689 Other abnormalities of gait and mobility: Secondary | ICD-10-CM

## 2016-12-28 DIAGNOSIS — R2681 Unsteadiness on feet: Secondary | ICD-10-CM

## 2016-12-28 NOTE — Therapy (Signed)
[facility] [address] [place], [place], [address] Phone: [phone]   Fax:  [phone]  Physical Therapy Treatment  Patient Details  Name: Aaron Moon MRN: [id number] Date of Birth: 01/16/1946 Referring Provider: Claudette Laws MD   Encounter Date: 12/28/2016      PT End of Session - 12/28/16 1232    Visit Number 7   Number of Visits 18   Date for PT Re-Evaluation 02/05/17   Authorization Type Federal BCBS    PT Start Time 0930   PT Stop Time 1015   PT Time Calculation (min) 45 min   Equipment Utilized During Treatment Gait belt   Activity Tolerance Patient tolerated treatment well   Behavior During Therapy [facility] for tasks assessed/performed      Past Medical History:  Diagnosis Date  . Arthritis   . Atrial fibrillation (HCC)   . Diabetes mellitus without complication (HCC)   . Gout   . Hyperlipidemia   . Obesity   . Stroke [facility])     Past Surgical History:  Procedure Laterality Date  . ABDOMINAL SURGERY     colostomy & colostomy reversal  . ANKLE SURGERY    . EP IMPLANTABLE DEVICE N/A 09/02/2016   Procedure: Loop Recorder Insertion;  Surgeon: Duke Salvia, MD;  Location: [facility] INVASIVE CV LAB;  Service: Cardiovascular;  Laterality: N/A;  . TEE WITHOUT CARDIOVERSION N/A 09/02/2016   Procedure: TRANSESOPHAGEAL ECHOCARDIOGRAM (TEE);  Surgeon: Laurey Morale, MD;  Location: [facility] ENDOSCOPY;  Service: Cardiovascular;  Laterality: N/A;    There were no vitals filed for this visit.      Subjective Assessment - 12/28/16 0935    Subjective Patient denies any new changes, complaints, or falls.    Limitations Lifting;Standing;Walking;House hold activities   Patient Stated Goals to walk better/with more ease    Currently in Pain? Other (Comment)  intermittent neuropathic pain    Pain Onset More than a month ago                         [facility] Adult PT Treatment/Exercise - 12/28/16 0930       Transfers   Transfers Sit to Stand;Stand to Sit;Stand Pivot Transfers   Sit to Stand 5: Supervision;With upper extremity assist;From chair/3-in-1   Stand to Sit 5: Supervision;With upper extremity assist;To chair/3-in-1   Stand to Sit Details (indicate cue type and reason) Verbal cues for precautions/safety  when fatigued      Ambulation/Gait   Ambulation/Gait Yes   Ambulation/Gait Assistance 5: Supervision;4: Min guard;4: Min assist  S indoor surfaces; min guard - min A on gravel/grass   Ambulation/Gait Assistance Details patient requires min guard with intermittent min A when ambulating on gravel/grass due to poor foot clearance   Ambulation Distance (Feet) 87 Feet  including paved/grass/gravel    Assistive device None   Gait Pattern Step-to pattern;Decreased arm swing - right;Decreased step length - left;Decreased stance time - right;Decreased stride length;Decreased dorsiflexion - right;Decreased weight shift to right;Trendelenburg;Trunk flexed;Abducted - left;Poor foot clearance - right   Ambulation Surface Level;Unlevel;Indoor;Outdoor;Gravel;Grass     High Level Balance   High Level Balance Activities --   High Level Balance Comments --     Neuro Re-ed    Neuro Re-ed Details  On grass/gravel: forward/backwards marching and side stepping. Patient required practice for technique on paved surface prior to transitioning to compliant surfaces. PT required to provide demonstration and cueing for technique for sequencing with  min guard and intermittent min A due to LOB. In parallel bars: performed cross over stepping with BUE support on bars. Progressed to unilateral UE support on bars with intermittent tactile cueing and min guard from PT due to missteps. Progressed to unilateral fingertip support when performing cross over, but requires min guard with intermittent min A due to missteps and LOB. Performed forward step ups on 6 inch steps with BUE support and supervision from PT.  Progressed to unilateral UE support on forward step ups/downs requiring min guard with intermittent min A due to LOB. Patient required multiple seated rest breaks due to fatigue.                   PT Short Term Goals - 12/08/16 1550      PT SHORT TERM GOAL #1   Title Patient will verbalize understanding of initial HEP. (TARGET DATE: 01/08/2017)   Time 4   Period Weeks   Status New     PT SHORT TERM GOAL #2   Title Patient will have a TUG score </=13.5 to indicate a decrease in his risk of falling. (TARGET DATE: 01/08/2017)    Time 4   Period Weeks   Status New     PT SHORT TERM GOAL #3   Title Patient will demonstrate ability to stand on LLE without UE support for >/=5 seconds to indicate a decrease in his risk of falling. (TARGET DATE: 01/08/2017)    Time 4   Period Weeks   Status New     PT SHORT TERM GOAL #4   Title Patient will demonstrate ability to ascend/descend 4 stairs with unilateral UE support to indicate a decrease in his risk of falling when ambulating on stairs in the community. (TARGET DATE: 01/08/2017)    Time 4   Period Weeks   Status New           PT Long Term Goals - 12/15/16 1639      PT LONG TERM GOAL #1   Title Patient will verbalize understanding of onogoing HEP. (TARGET DATE: 02/05/2017)    Time 8   Period Weeks   Status New     PT LONG TERM GOAL #2   Title PT will perform 6 minute walk test (without an assistive device) and goal will be set to indicate an increase in patient's endurance. (TARGET DATE: 02/05/2017)    Time 8   Period Weeks   Status New     PT LONG TERM GOAL #3   Title Patient will score >/= 20/30 on the FGA (without an assistive device) to indicate a decrease in the patient's risk of falling. (TARGET DATE: 02/05/2017)    Baseline FGA = 12/30 (performed 12/10/2016)   Time 8   Period Weeks   Status New     PT LONG TERM GOAL #4   Title Patient will have a gait velocity of >/= 2.6162ft/s without an assistive device to indicate a  community ambulator. (TARGET DATE: 02/05/2017)    Time 8   Period Weeks   Status New     PT LONG TERM GOAL #5   Title Patient will demonstrate ability to independently ambulate 500 feet outdoors (including ramps/curbs/grass) without an assistive device to indicate a decrease in his risk of falling when out in the community. (TARGET DATE: 02/05/2017)    Time 8   Period Weeks   Status New     PT LONG TERM GOAL #6   Title Patient will score >/=  52/56 on the Berg Balance Test to indicate a decrease in his risk of falling. (TARGET DATE: 02/05/2017)    Time 8   Period Weeks   Status New               Plan - 12/28/16 1234    Clinical Impression Statement Today's skilled PT session focused on advancing patient's gait training over multiple outdoor surfaces and advancing patient's LE strengthening and balance activities. Patient required multiple seated rest breaks due to fatigue. Patient is making progress towards goals, and will benefit from continued skilled PT to address functional mobility deficits.    Rehab Potential Good   Clinical Impairments Affecting Rehab Potential mild cognitive and memory deficits, bunion surgery, arthritis, atrial fibrillation, DM type 2, gout, obesity, colostomy & reversal, hyperlipidemia    PT Frequency 2x / week   PT Duration 8 weeks   PT Treatment/Interventions ADLs/Self Care Home Management;Electrical Stimulation;Ultrasound;Cognitive remediation;Neuromuscular re-education;Balance training;Therapeutic exercise;Therapeutic activities;Functional mobility training;Stair training;Gait training;DME Instruction;Patient/family education;Manual techniques;Passive range of motion   PT Next Visit Plan advance gait training over outdoor and compliant surfaces; advance balance and LE strengthening exercises    Consulted and Agree with Plan of Care Patient      Patient will benefit from skilled therapeutic intervention in order to improve the following deficits and  impairments:  Abnormal gait, Cardiopulmonary status limiting activity, Decreased activity tolerance, Decreased balance, Decreased cognition, Decreased coordination, Decreased safety awareness, Decreased range of motion, Decreased mobility, Decreased knowledge of use of DME, Decreased endurance, Decreased strength, Difficulty walking, Impaired flexibility, Impaired sensation, Postural dysfunction, Pain  Visit Diagnosis: Muscle weakness (generalized)  Other lack of coordination  Unsteadiness on feet  Other abnormalities of gait and mobility     Problem List Patient Active Problem List   Diagnosis Date Noted  . Late effect of cerebrovascular accident (CVA) 09/30/2016  . Paroxysmal atrial fibrillation (HCC) 09/23/2016  . Status post placement of implantable loop recorder   . Acute blood loss anemia   . Hypoalbuminemia due to protein-calorie malnutrition (HCC)   . Labile blood pressure   . Acute ischemic left MCA stroke (HCC) 09/02/2016  . Ataxia, post-stroke   . Adjustment disorder with mixed anxiety and depressed mood   . Status post right foot surgery   . History of gout   . Primary insomnia   . Acute ischemic stroke (HCC)   . Idiopathic gout   . Morbid obesity (HCC)   . Diastolic dysfunction   . Benign essential HTN   . Diabetes mellitus (HCC)   . Leukocytosis   . CVA (cerebral vascular accident) (HCC) 08/30/2016  . Stroke (cerebrum) (HCC) 08/30/2016  . 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 Hammertoe of right foot 07/20/2016  . Hallux valgus, acquired, bilateral 02/19/2014  . Gout 02/13/2013  . Hyperlipidemia 01/18/2012  . Obesity 01/18/2012    Chase Picket, SPT  12/28/2016, 12:38 PM  [facility] [address] [place], [place], [address] Phone: [phone]   Fax:  [phone]  Name: Aaron Moon MRN: [id number] Date of Birth: [birth year]

## 2016-12-28 NOTE — Therapy (Signed)
[facility] [address] [place], [place], [address] Phone: [phone]   Fax:  [phone]  Occupational Therapy Treatment  Patient Details  Name: Aaron Moon MRN: [id number] Date of Birth: 1946/01/08 Referring Provider: Dr. Alysia Penna  Encounter Date: 12/28/2016      OT End of Session - 12/28/16 0903    Visit Number 7   Number of Visits 17   Date for OT Re-Evaluation 02/05/17   Authorization Type Federal BC/BS, [place] - G code needed   Authorization - Visit Number 7   Authorization - Number of Visits 10   OT Start Time 0845   OT Stop Time 0930   OT Time Calculation (min) 45 min   Activity Tolerance Patient tolerated treatment well      Past Medical History:  Diagnosis Date  . Arthritis   . Atrial fibrillation ([place])   . Diabetes mellitus without complication ([place])   . Gout   . Hyperlipidemia   . Obesity   . Stroke [facility])     Past Surgical History:  Procedure Laterality Date  . ABDOMINAL SURGERY     colostomy & colostomy reversal  . ANKLE SURGERY    . EP IMPLANTABLE DEVICE N/A 09/02/2016   Procedure: Loop Recorder Insertion;  Surgeon: Deboraha Sprang, MD;  Location: [place] CV LAB;  Service: Cardiovascular;  Laterality: N/A;  . TEE WITHOUT CARDIOVERSION N/A 09/02/2016   Procedure: TRANSESOPHAGEAL ECHOCARDIOGRAM (TEE);  Surgeon: Larey Dresser, MD;  Location: [facility];  Service: Cardiovascular;  Laterality: N/A;    There were no vitals filed for this visit.      Subjective Assessment - 12/28/16 0844    Subjective  I really don't cook.    Pertinent History Lt MCA CVA (08/30/16) PMH: HTN, A-fib, DM, OA   Limitations internal loop recorder, fall risk   Patient Stated Goals to get my Rt arm stronger   Currently in Pain? Yes  neuropathy   Pain Score 2    Pain Location Foot  O.T. not addressing d/t outside scope of practice and premorbid   Pain Orientation Right;Left            [facility]  OT Assessment - 12/28/16 0001      Coordination   Right 9 Hole Peg Test 28.75 sec   Box and Blocks Rt = 47                   OT Treatments/Exercises (OP) - 12/28/16 0001      ADLs   Writing Pt writing 2 paragraphs with approx. 80% legibility with built up pen and extra time   ADL Comments Assessed STG's and LTG's and progress to date. Pt has made progress in both coordination and functional use of RUE     Shoulder Exercises: ROM/Strengthening   UBE (Upper Arm Bike) UBE x 10 min. Level 5 for strength/endurance     Fine Motor Coordination   Fine Motor Coordination Small Pegboard   Small Pegboard Pt manipulating up to 5 pegs at a time to place in pegboard, copying design with Rt hand. Pt with min difficulty/drops. Pt also with min cueing to follow directions properly                  OT Short Term Goals - 12/28/16 0900      OT SHORT TERM GOAL #1   Title Independent with coordination and putty HEP (Due 01/08/17)   Time 4   Period Weeks  Status Achieved     OT SHORT TERM GOAL #2   Title Pt to improve functional use of RUE as evidenced by performing 45 blocks on Blocks & Box test   Baseline eval = 39 (Lt = 49)    Time 4   Period Weeks   Status Achieved  Rt = 47 blocks     OT SHORT TERM GOAL #3   Title Pt to write paragraph maintaining 90% or greater legibility   Baseline eval: sentence at 75% legibility   Time 4   Period Weeks   Status On-going           OT Long Term Goals - 12/28/16 0901      OT LONG TERM GOAL #1   Title Independent with RUE strengthening HEP for strength/endurance (due by 02/05/17)    Time --  6-8 weeks for all LTG's   Status Achieved     OT LONG TERM GOAL #2   Title Improve coordination Rt hand as evidenced by performing 9 hole peg test in 30 sec. or less   Baseline eval = 36.56 sec   Status Achieved  Rt = 28.75 sec.      OT LONG TERM GOAL #3   Title Pt to verbalize understanding of memory compensatory strategies and  how to implement at home/work   Status Achieved               Plan - 12/28/16 0901    Clinical Impression Statement Pt has met all LTG's and 2/3 STG's. Pt approximating remaining STG.    Rehab Potential Good   OT Frequency 2x / week   OT Duration 8 weeks   OT Treatment/Interventions Self-care/ADL training;DME and/or AE instruction;Patient/family education;Therapeutic exercises;Therapeutic activities;Neuromuscular education;Functional Mobility Training;Passive range of motion;Cognitive remediation/compensation;Visual/perceptual remediation/compensation;Manual Therapy   Plan Anticipate d/c next session, continue to practice writing, RUE strength/endurance and coordination   OT Home Exercise Plan Education provided:  12/10/16  Coordination HEP, green putty HEP. 12/17/16: memory strategies, putty HEP. 12/22/16: Theraband HEP    Consulted and Agree with Plan of Care Patient      Patient will benefit from skilled therapeutic intervention in order to improve the following deficits and impairments:  Decreased coordination, Decreased endurance, Decreased safety awareness, Decreased activity tolerance, Decreased knowledge of precautions, Impaired UE functional use, Decreased mobility, Decreased strength, Decreased cognition, Impaired perceived functional ability  Visit Diagnosis: Other lack of coordination  Muscle weakness (generalized)  Hemiplegia and hemiparesis following cerebral infarction affecting right dominant side [facility])    Problem List Patient Active Problem List   Diagnosis Date Noted  . Late effect of cerebrovascular accident (CVA) 09/30/2016  . Paroxysmal atrial fibrillation ([place]) 09/23/2016  . Status post placement of implantable loop recorder   . Acute blood loss anemia   . Hypoalbuminemia due to protein-calorie malnutrition ([place])   . Labile blood pressure   . Acute ischemic left MCA stroke ([place]) 09/02/2016  . Ataxia, post-stroke   . Adjustment disorder with mixed  anxiety and depressed mood   . Status post right foot surgery   . History of gout   . Primary insomnia   . Acute ischemic stroke ([place])   . Idiopathic gout   . Morbid obesity ([place])   . Diastolic dysfunction   . Benign essential HTN   . Diabetes mellitus ([place])   . Leukocytosis   . CVA (cerebral vascular accident) ([place]) 08/30/2016  . Stroke (cerebrum) ([place]) 08/30/2016  . 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  Hammertoe of right foot 07/20/2016  . Hallux valgus, acquired, bilateral 02/19/2014  . Gout 02/13/2013  . Hyperlipidemia 01/18/2012  . Obesity 01/18/2012    Carey Bullocks, OTR/L 12/28/2016, 9:28 AM  [facility] [address] [place], [place], [address] Phone: [phone]   Fax:  [phone]  Name: Giordano Getman MRN: [id number] Date of Birth: 1945/12/22

## 2016-12-29 ENCOUNTER — Ambulatory Visit: Payer: Federal, State, Local not specified - PPO | Admitting: Physical Therapy

## 2016-12-29 ENCOUNTER — Ambulatory Visit: Payer: Federal, State, Local not specified - PPO | Admitting: Occupational Therapy

## 2016-12-29 ENCOUNTER — Encounter: Payer: Self-pay | Admitting: Physical Therapy

## 2016-12-29 DIAGNOSIS — I69351 Hemiplegia and hemiparesis following cerebral infarction affecting right dominant side: Secondary | ICD-10-CM

## 2016-12-29 DIAGNOSIS — R2689 Other abnormalities of gait and mobility: Secondary | ICD-10-CM | POA: Diagnosis not present

## 2016-12-29 DIAGNOSIS — R2681 Unsteadiness on feet: Secondary | ICD-10-CM

## 2016-12-29 DIAGNOSIS — M6281 Muscle weakness (generalized): Secondary | ICD-10-CM

## 2016-12-29 DIAGNOSIS — R278 Other lack of coordination: Secondary | ICD-10-CM

## 2016-12-29 NOTE — Therapy (Signed)
[facility] [address] [place], [place], [address] Phone: [phone]   Fax:  [phone]  Physical Therapy Treatment  Patient Details  Name: Aaron SouDana Moon MRN: [id number] Date of Birth: 1946-07-27 Referring Provider: Claudette LawsAndrew Kirsteins MD   Encounter Date: 12/29/2016      PT End of Session - 12/29/16 0848    Visit Number 8   Number of Visits 18   Date for PT Re-Evaluation 02/05/17   Authorization Type Federal BCBS    PT Start Time 0845   PT Stop Time 0928   PT Time Calculation (min) 43 min   Equipment Utilized During Treatment Gait belt   Activity Tolerance Patient tolerated treatment well   Behavior During Therapy [facility] for tasks assessed/performed      Past Medical History:  Diagnosis Date  . Arthritis   . Atrial fibrillation (HCC)   . Diabetes mellitus without complication (HCC)   . Gout   . Hyperlipidemia   . Obesity   . Stroke [facility](HCC)     Past Surgical History:  Procedure Laterality Date  . ABDOMINAL SURGERY     colostomy & colostomy reversal  . ANKLE SURGERY    . EP IMPLANTABLE DEVICE N/A 09/02/2016   Procedure: Loop Recorder Insertion;  Surgeon: Duke SalviaSteven C Klein, MD;  [facility];  Service: Cardiovascular;  Laterality: N/A;  . TEE WITHOUT CARDIOVERSION N/A 09/02/2016   Procedure: TRANSESOPHAGEAL ECHOCARDIOGRAM (TEE);  Surgeon: Laurey Moralealton S McLean, MD;  [facility];  Service: Cardiovascular;  Laterality: N/A;    There were no vitals filed for this visit.      Subjective Assessment - 12/29/16 0846    Subjective No new complaints. No falls  to report. Wrapped up with OT today.    Limitations Lifting;Standing;Walking;House hold activities   Patient Stated Goals to walk better/with more ease    Currently in Pain? Yes   Pain Score 2    Pain Location Foot   Pain Orientation Right;Left   Pain Descriptors / Indicators Numbness   Pain Type Neuropathic pain;Chronic pain   Pain  Onset More than a month ago   Pain Frequency Intermittent   Aggravating Factors  increased activity   Pain Relieving Factors rest            [facility] Adult PT Treatment/Exercise - 12/29/16 0849      Transfers   Transfers Sit to Stand;Stand to Sit;Stand Pivot Transfers   Sit to Stand 5: Supervision;With upper extremity assist;From chair/3-in-1   Stand to Sit 5: Supervision;With upper extremity assist;To chair/3-in-1     Ambulation/Gait   Ambulation/Gait Yes   Ambulation/Gait Assistance 5: Supervision;4: Min guard   Ambulation/Gait Assistance Details min guard assist only on gravel/grass with no physical assistance needed. cues for increased step length with gait.    Ambulation Distance (Feet) 500 Feet   Assistive device None   Gait Pattern Step-to pattern;Decreased arm swing - right;Decreased step length - left;Decreased stance time - right;Decreased stride length;Decreased dorsiflexion - right;Decreased weight shift to right;Trendelenburg;Trunk flexed;Abducted - left;Poor foot clearance - right   Ambulation Surface Level;Indoor;Unlevel;Outdoor;Paved;Gravel;Grass     High Level Balance   High Level Balance Activities Marching forwards;Marching backwards;Tandem walking  toe walk fwd/bwd, tandem fwd/bwd, heel walk fwd/bwd   High Level Balance Comments on both red mats next to counter top: single UE support with pt performing 3 laps each with min guard to min assist for balance.      Knee/Hip Exercises: Standing  Lateral Step Up Both;10 reps;Hand Hold: 2;Step Height: 6";Limitations  light fingertip hold only   Lateral Step Up Limitations cues on form and technique   Forward Step Up Both;10 reps;Hand Hold: 2;Step Height: 6";Limitations  light fingertip hold only   Forward Step Up Limitations cues on form and technique     Knee/Hip Exercises: Seated   Other Seated Knee/Hip Exercises cues for full upright posture and slow controlled descent with sitting down   Sit to Sand 2 sets;10  reps;without UE support  with OH press using 2# ball (see above)            PT Short Term Goals - 12/28/16 1253      PT SHORT TERM GOAL #1   Title Patient will verbalize understanding of initial HEP. (TARGET DATE: 01/08/2017)   Time 4   Period Weeks   Status On-going     PT SHORT TERM GOAL #2   Title Patient will have a TUG score </=13.5 to indicate a decrease in his risk of falling. (TARGET DATE: 01/08/2017)    Time 4   Period Weeks   Status On-going     PT SHORT TERM GOAL #3   Title Patient will demonstrate ability to stand on LLE without UE support for >/=5 seconds to indicate a decrease in his risk of falling. (TARGET DATE: 01/08/2017)    Time 4   Period Weeks   Status On-going     PT SHORT TERM GOAL #4   Title Patient will demonstrate ability to ascend/descend 4 stairs with unilateral UE support to indicate a decrease in his risk of falling when ambulating on stairs in the community. (TARGET DATE: 01/08/2017)    Time 4   Period Weeks   Status On-going           PT Long Term Goals - 12/28/16 1253      PT LONG TERM GOAL #1   Title Patient will verbalize understanding of onogoing HEP. (TARGET DATE: 02/05/2017)    Time 8   Period Weeks   Status On-going     PT LONG TERM GOAL #2   Title PT will perform 6 minute walk test (without an assistive device) and goal will be set to indicate an increase in patient's endurance. (TARGET DATE: 02/05/2017)    Time 8   Period Weeks   Status On-going     PT LONG TERM GOAL #3   Title Patient will score >/= 20/30 on the FGA (without an assistive device) to indicate a decrease in the patient's risk of falling. (TARGET DATE: 02/05/2017)    Baseline FGA = 12/30 (performed 12/10/2016)   Time 8   Period Weeks   Status On-going     PT LONG TERM GOAL #4   Title Patient will have a gait velocity of >/= 2.77ft/s without an assistive device to indicate a community ambulator. (TARGET DATE: 02/05/2017)    Time 8   Period Weeks   Status On-going      PT LONG TERM GOAL #5   Title Patient will demonstrate ability to independently ambulate 500 feet outdoors (including ramps/curbs/grass) without an assistive device to indicate a decrease in his risk of falling when out in the community. (TARGET DATE: 02/05/2017)    Time 8   Period Weeks   Status On-going     PT LONG TERM GOAL #6   Title Patient will score >/= 52/56 on the Berg Balance Test to indicate a decrease in his risk of falling. (TARGET  DATE: 02/05/2017)    Time 8   Period Weeks   Status On-going           Plan - 12/29/16 0848    Clinical Impression Statement Today's skilled session continued to focus on gait, activity tolerance and high level balance. Pt continues to fatigue with increased activity needing rest breaks throughout session. Improved balance reactions noted on complaint surfaces. Pt is progressing towards goals and should benefit from continued PT to progress toward unmet goals.    Rehab Potential Good   Clinical Impairments Affecting Rehab Potential mild cognitive and memory deficits, bunion surgery, arthritis, atrial fibrillation, DM type 2, gout, obesity, colostomy & reversal, hyperlipidemia    PT Frequency 2x / week   PT Duration 8 weeks   PT Treatment/Interventions ADLs/Self Care Home Management;Electrical Stimulation;Ultrasound;Cognitive remediation;Neuromuscular re-education;Balance training;Therapeutic exercise;Therapeutic activities;Functional mobility training;Stair training;Gait training;DME Instruction;Patient/family education;Manual techniques;Passive range of motion   PT Next Visit Plan advance gait training over outdoor and compliant surfaces; advance balance and LE strengthening exercises    Consulted and Agree with Plan of Care Patient      Patient will benefit from skilled therapeutic intervention in order to improve the following deficits and impairments:  Abnormal gait, Cardiopulmonary status limiting activity, Decreased activity tolerance,  Decreased balance, Decreased cognition, Decreased coordination, Decreased safety awareness, Decreased range of motion, Decreased mobility, Decreased knowledge of use of DME, Decreased endurance, Decreased strength, Difficulty walking, Impaired flexibility, Impaired sensation, Postural dysfunction, Pain  Visit Diagnosis: Muscle weakness (generalized)  Hemiplegia and hemiparesis following cerebral infarction affecting right dominant side (HCC)  Unsteadiness on feet  Other abnormalities of gait and mobility     Problem List Patient Active Problem List   Diagnosis Date Noted  . Late effect of cerebrovascular accident (CVA) 09/30/2016  . Paroxysmal atrial fibrillation (HCC) 09/23/2016  . Status post placement of implantable loop recorder   . Acute blood loss anemia   . Hypoalbuminemia due to protein-calorie malnutrition (HCC)   . Labile blood pressure   . Acute ischemic left MCA stroke (HCC) 09/02/2016  . Ataxia, post-stroke   . Adjustment disorder with mixed anxiety and depressed mood   . Status post right foot surgery   . History of gout   . Primary insomnia   . Acute ischemic stroke (HCC)   . Idiopathic gout   . Morbid obesity (HCC)   . Diastolic dysfunction   . Benign essential HTN   . Diabetes mellitus (HCC)   . Leukocytosis   . CVA (cerebral vascular accident) (HCC) 08/30/2016  . Stroke (cerebrum) (HCC) 08/30/2016  . 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 Hammertoe of right foot 07/20/2016  . Hallux valgus, acquired, bilateral 02/19/2014  . Gout 02/13/2013  . Hyperlipidemia 01/18/2012  . Obesity 01/18/2012    Sallyanne Kuster 12/29/2016, 2:51 PM   [facility] [address] [place], [place], [address] Phone: [phone]   Fax:  [phone]  Name: Aaron Moon MRN: [id number] Date of Birth: Oct 05, 1945

## 2016-12-29 NOTE — Therapy (Signed)
[facility] [address] [place], [place], [address] Phone: [phone]   Fax:  [phone]  Occupational Therapy Treatment  Patient Details  Name: Aaron Moon MRN: [id number] Date of Birth: [birth year] Referring Provider: Dr. Alysia Penna  Encounter Date: 12/29/2016      OT End of Session - 12/29/16 0832    Visit Number 8   Number of Visits 17   Date for OT Re-Evaluation 02/05/17   Authorization Type Federal BC/BS, [place] - G code needed   Authorization - Visit Number 8   Authorization - Number of Visits 10   OT Start Time 0802   OT Stop Time [phone]   OT Time Calculation (min) 40 min   Activity Tolerance Patient tolerated treatment well      Past Medical History:  Diagnosis Date  . Arthritis   . Atrial fibrillation ([place])   . Diabetes mellitus without complication ([place])   . Gout   . Hyperlipidemia   . Obesity   . Stroke [facility])     Past Surgical History:  Procedure Laterality Date  . ABDOMINAL SURGERY     colostomy & colostomy reversal  . ANKLE SURGERY    . EP IMPLANTABLE DEVICE N/A 09/02/2016   Procedure: Loop Recorder Insertion;  Surgeon: Deboraha Sprang, MD;  Location: [facility] CV LAB;  Service: Cardiovascular;  Laterality: N/A;  . TEE WITHOUT CARDIOVERSION N/A 09/02/2016   Procedure: TRANSESOPHAGEAL ECHOCARDIOGRAM (TEE);  Surgeon: Larey Dresser, MD;  Location: [place];  Service: Cardiovascular;  Laterality: N/A;    There were no vitals filed for this visit.      Subjective Assessment - 12/29/16 0802    Pertinent History Lt MCA CVA (08/30/16) PMH: HTN, A-fib, DM, OA   Limitations internal loop recorder, fall risk   Patient Stated Goals to get my Rt arm stronger   Currently in Pain? Yes  neuropathy in feet - OT not addressing   Pain Score 2                       OT Treatments/Exercises (OP) - 12/29/16 0001      ADLs   Writing Pt writing 2 paragraphs with approx. 95%  legibility with built up pen and incr. time   ADL Comments Reviewed memory strategies with patient     Shoulder Exercises: ROM/Strengthening   UBE (Upper Arm Bike) UBE x 10 min. Level 7 for strength/endurance     Fine Motor Coordination   Small Pegboard Pt manipulating up to 5 pegs at a time to place in pegboard, copying design with Rt hand. Pt with min difficulty/drops. Pt copying design at 100% accuracy                  OT Short Term Goals - 12/29/16 0817      OT SHORT TERM GOAL #1   Title Independent with coordination and putty HEP (Due 01/08/17)   Time 4   Period Weeks   Status Achieved     OT SHORT TERM GOAL #2   Title Pt to improve functional use of RUE as evidenced by performing 45 blocks on Blocks & Box test   Baseline eval = 39 (Lt = 49)    Time 4   Period Weeks   Status Achieved  Rt = 47 blocks     OT SHORT TERM GOAL #3   Title Pt to write paragraph maintaining 90% or greater legibility   Baseline eval:  sentence at 75% legibility   Time 4   Period Weeks   Status Partially Met  not consistent, but if pt slows down, he can achieve 90% or > legibility           OT Long Term Goals - 12/28/16 0901      OT LONG TERM GOAL #1   Title Independent with RUE strengthening HEP for strength/endurance (due by 02/05/17)    Time --  6-8 weeks for all LTG's   Status Achieved     OT LONG TERM GOAL #2   Title Improve coordination Rt hand as evidenced by performing 9 hole peg test in 30 sec. or less   Baseline eval = 36.56 sec   Status Achieved  Rt = 28.75 sec.      OT LONG TERM GOAL #3   Title Pt to verbalize understanding of memory compensatory strategies and how to implement at home/work   Status Achieved               Plan - 12/30/16 0831    Clinical Impression Statement Pt has met all STG's and LTG's.    Rehab Potential Good   OT Treatment/Interventions Self-care/ADL training;DME and/or AE instruction;Patient/family education;Therapeutic  exercises;Therapeutic activities;Neuromuscular education;Functional Mobility Training;Passive range of motion;Cognitive remediation/compensation;Visual/perceptual remediation/compensation;Manual Therapy   Plan D/C O.T.    OT Home Exercise Plan Education provided:  12/10/16  Coordination HEP, green putty HEP. 12/17/16: memory strategies, putty HEP. 12/22/16: Theraband HEP    Consulted and Agree with Plan of Care Patient      Patient will benefit from skilled therapeutic intervention in order to improve the following deficits and impairments:  Decreased coordination, Decreased endurance, Decreased safety awareness, Decreased activity tolerance, Decreased knowledge of precautions, Impaired UE functional use, Decreased mobility, Decreased strength, Decreased cognition, Impaired perceived functional ability  Visit Diagnosis: Other lack of coordination  Muscle weakness (generalized)  Hemiplegia and hemiparesis following cerebral infarction affecting right dominant side (HCC)  Unsteadiness on feet      G-Codes - 12/30/16 0844    Functional Assessment Tool Used (Outpatient only) RUE: 9 hole peg test = 28.75 sec. Box & Blocks = 47    Functional Limitation Carrying, moving and handling objects   Carrying, Moving and Handling Objects Goal Status [phone]) At least 1 percent but less than 20 percent impaired, limited or restricted   Carrying, Moving and Handling Objects Discharge Status [phone]) At least 1 percent but less than 20 percent impaired, limited or restricted      Problem List Patient Active Problem List   Diagnosis Date Noted  . Late effect of cerebrovascular accident (CVA) 09/30/2016  . Paroxysmal atrial fibrillation ([place]) 09/23/2016  . Status post placement of implantable loop recorder   . Acute blood loss anemia   . Hypoalbuminemia due to protein-calorie malnutrition ([place])   . Labile blood pressure   . Acute ischemic left MCA stroke ([place]) 09/02/2016  . Ataxia, post-stroke   .  Adjustment disorder with mixed anxiety and depressed mood   . Status post right foot surgery   . History of gout   . Primary insomnia   . Acute ischemic stroke ([place])   . Idiopathic gout   . Morbid obesity ([place])   . Diastolic dysfunction   . Benign essential HTN   . Diabetes mellitus ([place])   . Leukocytosis   . CVA (cerebral vascular accident) ([place]) 08/30/2016  . Stroke (cerebrum) ([place]) 08/30/2016  . 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 Hammertoe  of right foot 07/20/2016  . Hallux valgus, acquired, bilateral 02/19/2014  . Gout 02/13/2013  . Hyperlipidemia 01/18/2012  . Obesity 01/18/2012    OCCUPATIONAL THERAPY DISCHARGE SUMMARY  Visits from Start of Care: 8  Current functional level related to goals / functional outcomes: SEE ABOVE   Remaining deficits: Balance   Education / Equipment: HEP's, memory strategies  Plan: Patient agrees to discharge.  Patient goals were met. Patient is being discharged due to meeting the stated rehab goals.  ?????         Carey Bullocks, OTR/L 12/29/2016, 8:45 AM  [facility] [address] [place], [place], [address] Phone: [phone]   Fax:  [phone]  Name: Dhani Dannemiller MRN: [id number] Date of Birth: 30-Jun-1946

## 2016-12-30 ENCOUNTER — Encounter: Payer: Federal, State, Local not specified - PPO | Admitting: Occupational Therapy

## 2016-12-30 ENCOUNTER — Ambulatory Visit: Payer: Federal, State, Local not specified - PPO | Admitting: Physical Therapy

## 2016-12-31 ENCOUNTER — Ambulatory Visit (INDEPENDENT_AMBULATORY_CARE_PROVIDER_SITE_OTHER): Payer: Federal, State, Local not specified - PPO | Admitting: *Deleted

## 2016-12-31 DIAGNOSIS — I639 Cerebral infarction, unspecified: Secondary | ICD-10-CM | POA: Diagnosis not present

## 2017-01-01 NOTE — Progress Notes (Signed)
Carelink Summary Report / Loop Recorder 

## 2017-01-04 LAB — CUP PACEART REMOTE DEVICE CHECK
Date Time Interrogation Session: 20180524233632
Implantable Pulse Generator Implant Date: 20180124

## 2017-01-05 ENCOUNTER — Encounter: Payer: Federal, State, Local not specified - PPO | Admitting: *Deleted

## 2017-01-05 ENCOUNTER — Ambulatory Visit: Payer: Federal, State, Local not specified - PPO | Admitting: Physical Therapy

## 2017-01-05 DIAGNOSIS — I69351 Hemiplegia and hemiparesis following cerebral infarction affecting right dominant side: Secondary | ICD-10-CM

## 2017-01-05 DIAGNOSIS — R2681 Unsteadiness on feet: Secondary | ICD-10-CM

## 2017-01-05 DIAGNOSIS — M6281 Muscle weakness (generalized): Secondary | ICD-10-CM

## 2017-01-05 DIAGNOSIS — R2689 Other abnormalities of gait and mobility: Secondary | ICD-10-CM

## 2017-01-06 ENCOUNTER — Telehealth: Payer: Self-pay

## 2017-01-06 NOTE — Therapy (Signed)
[facility] [address] [place], [place], [address] Phone: [phone]   Fax:  [phone]  Physical Therapy Treatment  Patient Details  Name: Aaron SouDana Moon MRN: [id number] Date of Birth: 1946-02-20 Referring Provider: Claudette LawsAndrew Kirsteins MD   Encounter Date: 01/05/2017      PT End of Session - 01/05/17 0941    Visit Number 9   Number of Visits 18   Date for PT Re-Evaluation 02/05/17   Authorization Type Federal BCBS    PT Start Time 0935   PT Stop Time 1014   PT Time Calculation (min) 39 min   Equipment Utilized During Treatment Gait belt   Activity Tolerance Patient tolerated treatment well   Behavior [facility] for tasks assessed/performed      Past Medical History:  Diagnosis Date  . Arthritis   . Atrial fibrillation (HCC)   . Diabetes mellitus without complication (HCC)   . Gout   . Hyperlipidemia   . Obesity   . Stroke [facility](HCC)     Past Surgical History:  Procedure Laterality Date  . ABDOMINAL SURGERY     colostomy & colostomy reversal  . ANKLE SURGERY    . EP IMPLANTABLE DEVICE N/A 09/02/2016   Procedure: Loop Recorder Insertion;  Surgeon: Duke SalviaSteven C Klein, MD;  [facility];  Service: Cardiovascular;  Laterality: N/A;  . TEE WITHOUT CARDIOVERSION N/A 09/02/2016   Procedure: TRANSESOPHAGEAL ECHOCARDIOGRAM (TEE);  Surgeon: Laurey Moralealton S McLean, MD;  [facility];  Service: Cardiovascular;  Laterality: N/A;    There were no vitals filed for this visit.      Subjective Assessment - 01/05/17 0941    Subjective No new complaints. No falls  to report. Started allopuinol for gout on Friday. Unsure of dosage, will bring it in at next visit.    Limitations Lifting;Standing;Walking;House hold activities   Patient Stated Goals to walk better/with more ease    Currently in Pain? No/denies   Pain Score 0-No pain           [facility] Adult PT Treatment/Exercise - 01/05/17 0942       Ambulation/Gait   Ambulation/Gait Yes   Ambulation/Gait Assistance 4: Min guard;5: Supervision   Assistive device None   Gait Pattern Step-to pattern;Decreased arm swing - right;Decreased step length - left;Decreased stance time - right;Decreased stride length;Decreased dorsiflexion - right;Decreased weight shift to right;Trendelenburg;Trunk flexed;Abducted - left;Poor foot clearance - right   Ambulation Surface Level;Indoor   Gait Comments gait along ~50 foot hallway: forward gait with head movements left<>right and up<>down x 4 laps each with decreased gait speed and step length noted with head turns             Balance Exercises - 01/05/17 0952      Balance Exercises: Standing   Standing Eyes Closed Narrow base of support (BOS);Foam/compliant surface;30 secs;Other reps (comment);Limitations   SLS with Vectors Solid surface;Other reps (comment);Limitations   Wall Bumps Hip   Wall Bumps-Hips Foam/compliant surface;Anterior/posterior;Eyes opened;10 reps;Limitations   Balance Beam standing with feet across blue foam balance beam: forward stepping to floor and back onto foam x 10 reps each leg, min guard to min assist with light UE support on parallel bars. cues for larger steps with increased weight shifting.      Balance Exercises: Standing   Standing Eyes Closed Limitations in corner on airex with chair in front for safety: EC no head movments, progressing to [facility] for head movements left<>right, up<>down and diagonals  both ways. min to mod assist for balance.    SLS with Vectors Limitations 2 tall cones on floor: alternating fwd toe taps x 10 reps each foot, cross toe taps x 10 reps each foot. min guard to min assist for balance with cues on posture, stance position, and weight shifting for balance.    Wall Bumps Limitations standing with feet across blue foam beam: cues on correct ex form/technique             PT Short Term Goals - 12/28/16 1253      PT SHORT TERM GOAL #1    Title Patient will verbalize understanding of initial HEP. (TARGET DATE: 01/08/2017)   Time 4   Period Weeks   Status On-going     PT SHORT TERM GOAL #2   Title Patient will have a TUG score </=13.5 to indicate a decrease in his risk of falling. (TARGET DATE: 01/08/2017)    Time 4   Period Weeks   Status On-going     PT SHORT TERM GOAL #3   Title Patient will demonstrate ability to stand on LLE without UE support for >/=5 seconds to indicate a decrease in his risk of falling. (TARGET DATE: 01/08/2017)    Time 4   Period Weeks   Status On-going     PT SHORT TERM GOAL #4   Title Patient will demonstrate ability to ascend/descend 4 stairs with unilateral UE support to indicate a decrease in his risk of falling when ambulating on stairs in the community. (TARGET DATE: 01/08/2017)    Time 4   Period Weeks   Status On-going           PT Long Term Goals - 12/28/16 1253      PT LONG TERM GOAL #1   Title Patient will verbalize understanding of onogoing HEP. (TARGET DATE: 02/05/2017)    Time 8   Period Weeks   Status On-going     PT LONG TERM GOAL #2   Title PT will perform 6 minute walk test (without an assistive device) and goal will be set to indicate an increase in patient's endurance. (TARGET DATE: 02/05/2017)    Time 8   Period Weeks   Status On-going     PT LONG TERM GOAL #3   Title Patient will score >/= 20/30 on the FGA (without an assistive device) to indicate a decrease in the patient's risk of falling. (TARGET DATE: 02/05/2017)    Baseline FGA = 12/30 (performed 12/10/2016)   Time 8   Period Weeks   Status On-going     PT LONG TERM GOAL #4   Title Patient will have a gait velocity of >/= 2.58ft/s without an assistive device to indicate a community ambulator. (TARGET DATE: 02/05/2017)    Time 8   Period Weeks   Status On-going     PT LONG TERM GOAL #5   Title Patient will demonstrate ability to independently ambulate 500 feet outdoors (including ramps/curbs/grass) without  an assistive device to indicate a decrease in his risk of falling when out in the community. (TARGET DATE: 02/05/2017)    Time 8   Period Weeks   Status On-going     PT LONG TERM GOAL #6   Title Patient will score >/= 52/56 on the Berg Balance Test to indicate a decrease in his risk of falling. (TARGET DATE: 02/05/2017)    Time 8   Period Weeks   Status On-going  Plan - 01/05/17 0942    Clinical Impression Statement today's skilled session focused on balance with emphasis on ankle/hip strategies and balance on compliant surfaces. No issues reported except for fatigue, with pt needing short, seated rest breaks between activities. Pt is making steady progress toward goals and should benefit from continued PT to progress toward unmet goals.    Rehab Potential Good   Clinical Impairments Affecting Rehab Potential mild cognitive and memory deficits, bunion surgery, arthritis, atrial fibrillation, DM type 2, gout, obesity, colostomy & reversal, hyperlipidemia    PT Frequency 2x / week   PT Duration 8 weeks   PT Treatment/Interventions ADLs/Self Care Home Management;Electrical Stimulation;Ultrasound;Cognitive remediation;Neuromuscular re-education;Balance training;Therapeutic exercise;Therapeutic activities;Functional mobility training;Stair training;Gait training;DME Instruction;Patient/family education;Manual techniques;Passive range of motion   PT Next Visit Plan check STGs (due 01/08/17); continue to advance gait training over outdoor and compliant surfaces; advance balance and LE strengthening exercises    Consulted and Agree with Plan of Care Patient      Patient will benefit from skilled therapeutic intervention in order to improve the following deficits and impairments:  Abnormal gait, Cardiopulmonary status limiting activity, Decreased activity tolerance, Decreased balance, Decreased cognition, Decreased coordination, Decreased safety awareness, Decreased range of motion,  Decreased mobility, Decreased knowledge of use of DME, Decreased endurance, Decreased strength, Difficulty walking, Impaired flexibility, Impaired sensation, Postural dysfunction, Pain  Visit Diagnosis: Muscle weakness (generalized)  Hemiplegia and hemiparesis following cerebral infarction affecting right dominant side (HCC)  Unsteadiness on feet  Other abnormalities of gait and mobility     Problem List Patient Active Problem List   Diagnosis Date Noted  . Late effect of cerebrovascular accident (CVA) 09/30/2016  . Paroxysmal atrial fibrillation (HCC) 09/23/2016  . Status post placement of implantable loop recorder   . Acute blood loss anemia   . Hypoalbuminemia due to protein-calorie malnutrition (HCC)   . Labile blood pressure   . Acute ischemic left MCA stroke (HCC) 09/02/2016  . Ataxia, post-stroke   . Adjustment disorder with mixed anxiety and depressed mood   . Status post right foot surgery   . History of gout   . Primary insomnia   . Acute ischemic stroke (HCC)   . Idiopathic gout   . Morbid obesity (HCC)   . Diastolic dysfunction   . Benign essential HTN   . Diabetes mellitus (HCC)   . Leukocytosis   . CVA (cerebral vascular accident) (HCC) 08/30/2016  . Stroke (cerebrum) (HCC) 08/30/2016  . 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 Hammertoe of right foot 07/20/2016  . Hallux valgus, acquired, bilateral 02/19/2014  . Gout 02/13/2013  . Hyperlipidemia 01/18/2012  . Obesity 01/18/2012    Sallyanne Kuster, PTA, [facility] [address], [address] [place], [place] [address] [phone] 01/06/17, 10:51 AM   Name: Aaron Moon MRN: [id number] Date of Birth: 1946/08/02

## 2017-01-06 NOTE — Telephone Encounter (Signed)
Patient came to window stating he needs a letter stating why he has been out of work since October of 2017. Patient also states he needs this letter June 1st if possible.

## 2017-01-06 NOTE — Telephone Encounter (Signed)
Marylu LundJanet is taking care of this.

## 2017-01-07 ENCOUNTER — Encounter: Payer: Self-pay | Admitting: Physical Therapy

## 2017-01-07 ENCOUNTER — Ambulatory Visit: Payer: Federal, State, Local not specified - PPO | Admitting: Physical Therapy

## 2017-01-07 ENCOUNTER — Encounter: Payer: Federal, State, Local not specified - PPO | Admitting: Occupational Therapy

## 2017-01-07 DIAGNOSIS — I69351 Hemiplegia and hemiparesis following cerebral infarction affecting right dominant side: Secondary | ICD-10-CM

## 2017-01-07 DIAGNOSIS — R2689 Other abnormalities of gait and mobility: Secondary | ICD-10-CM

## 2017-01-07 DIAGNOSIS — R2681 Unsteadiness on feet: Secondary | ICD-10-CM

## 2017-01-07 DIAGNOSIS — M6281 Muscle weakness (generalized): Secondary | ICD-10-CM

## 2017-01-07 NOTE — Patient Instructions (Signed)
Perform these next to a counter top so you can hold on for balance:    "I love a Parade" Lift   At counter for balance as needed: high knee marching forward and then backward. 3 second pauses with each knee lift.  Repeat 3 laps each way. Do _1-2_ sessions per day. http://gt2.exer.us/345   Copyright  VHI. All rights reserved.  Feet Heel-Toe "Tandem"   At counter: Arms at sides, walk a straight line forward bringing one foot directly in front of the other, and then a straight line backwards bringing one foot directly behind the other one.  Repeat for _3 laps each way. Do _1-2_ sessions per day.  Copyright  VHI. All rights reserved.   Perform these in a corner with a chair in front for safety:  Feet Together (Compliant Surface) Varied Arm Positions - Eyes Closed    Stand on compliant surface: _pillow/s_ with feet together and arms as needed for balance Close eyes and visualize upright position. Hold__20__ seconds. Repeat _3_ times per session. Do _1-2_ sessions per day.  Copyright  VHI. All rights reserved.    Feet Together (Compliant Surface) Head Motion - Eyes Closed    Stand on compliant surface: _pillow/s with feet together. Close eyes and move head slowly: 1. Up and down 2. Left and right  Repeat _10_ times per session. Do _1-2_ sessions per day.

## 2017-01-07 NOTE — Therapy (Signed)
[facility] [address] [place], [place], [address] Phone: [phone]   Fax:  [phone]  Physical Therapy Treatment  Patient Details  Name: Aaron Moon MRN: [id number] Date of Birth: 01/11/1946 Referring Provider: Alysia Penna MD   Encounter Date: 01/07/2017      PT End of Session - 01/07/17 0855    Visit Number 10   Number of Visits 18   Date for PT Re-Evaluation 02/05/17   Authorization Type Federal BCBS    PT Start Time [phone]   PT Stop Time 0930   PT Time Calculation (min) 44 min   Equipment Utilized During Treatment Gait belt   Activity Tolerance Patient tolerated treatment well   Behavior During Therapy [facility] for tasks assessed/performed      Past Medical History:  Diagnosis Date  . Arthritis   . Atrial fibrillation ([place])   . Diabetes mellitus without complication ([place])   . Gout   . Hyperlipidemia   . Obesity   . Stroke [facility])     Past Surgical History:  Procedure Laterality Date  . ABDOMINAL SURGERY     colostomy & colostomy reversal  . ANKLE SURGERY    . EP IMPLANTABLE DEVICE N/A 09/02/2016   Procedure: Loop Recorder Insertion;  Surgeon: Deboraha Sprang, MD;  Location: [facility] CV LAB;  Service: Cardiovascular;  Laterality: N/A;  . TEE WITHOUT CARDIOVERSION N/A 09/02/2016   Procedure: TRANSESOPHAGEAL ECHOCARDIOGRAM (TEE);  Surgeon: Larey Dresser, MD;  Location: [place];  Service: Cardiovascular;  Laterality: N/A;    There were no vitals filed for this visit.      Subjective Assessment - 01/07/17 0852    Subjective No new complaitns. No falls or pain to report.    Limitations Lifting;Standing;Walking;House hold activities   Patient Stated Goals to walk better/with more ease             [facility] PT Assessment - 01/07/17 0856      Timed Up and Go Test   TUG Normal TUG   Normal TUG (seconds) 12.72            [facility] Adult PT Treatment/Exercise - 01/07/17 0900       Transfers   Transfers Sit to Stand;Stand to Sit   Sit to Stand 6: Modified independent (Device/Increase time);With upper extremity assist;From bed;From chair/3-in-1   Stand to Sit 6: Modified independent (Device/Increase time);With upper extremity assist;To bed;To chair/3-in-1     Ambulation/Gait   Ambulation/Gait Yes   Ambulation/Gait Assistance 5: Supervision   Ambulation/Gait Assistance Details indoors around gym with session    Ambulation Surface Level;Indoor   Stairs Yes   Stairs Assistance 5: Supervision   Stairs Assistance Details (indicate cue type and reason) ascending stairs with reciprocal pattern, descending with step to pattern   Stair Management Technique One rail Right;Alternating pattern;Step to pattern;Forwards   Number of Stairs 4     Neuro Re-ed    Neuro Re-ed Details  pt performed all exercises issued to HEP to date in session with cues on correct posture/ex technique.              PT Education - 01/07/17 [phone]    Education provided Yes   Education Details HEP: reissued balance exercises as pt has not been performing them at home. Reinforced need for exercise out of therapy to maximize progress. Initiated conversation on community fitness such as [employer], other [employer]. Discussed walking program for fitness as well. Pt does not feel the  weather is condusive to this, so reinforced gym as another option, or mall/shopping center.                           Person(s) Educated Patient   Methods Explanation;Demonstration;Handout;Verbal cues   Comprehension Verbalized understanding;Returned demonstration;Verbal cues required;Need further instruction          PT Short Term Goals - 01/07/17 0856      PT SHORT TERM GOAL #1   Title Patient will verbalize understanding of initial HEP. (TARGET DATE: 01/08/2017)   Baseline 01/07/17: pt able to verbalize HEP, however reports he has not been performing it at all   Time --   Period --   Status Partially Met     PT SHORT  TERM GOAL #2   Title Patient will have a TUG score </=13.5 to indicate a decrease in his risk of falling. (TARGET DATE: 01/08/2017)    Baseline 01/07/17: met today with score of    Time --   Period --   Status Achieved     PT SHORT TERM GOAL #3   Title Patient will demonstrate ability to stand on LLE without UE support for >/=5 seconds to indicate a decrease in his risk of falling. (TARGET DATE: 01/08/2017)    Baseline 01/07/17: met with >/= 5 second's on LLE, ~3 seconds on RLE   Time --   Period --   Status Achieved     PT SHORT TERM GOAL #4   Title Patient will demonstrate ability to ascend/descend 4 stairs with unilateral UE support to indicate a decrease in his risk of falling when ambulating on stairs in the community. (TARGET DATE: 01/08/2017)    Baseline 01/07/17: met today   Time --   Period --   Status Achieved           PT Long Term Goals - 12/28/16 1253      PT LONG TERM GOAL #1   Title Patient will verbalize understanding of onogoing HEP. (TARGET DATE: 02/05/2017)    Time 8   Period Weeks   Status On-going     PT LONG TERM GOAL #2   Title PT will perform 6 minute walk test (without an assistive device) and goal will be set to indicate an increase in patient's endurance. (TARGET DATE: 02/05/2017)    Time 8   Period Weeks   Status On-going     PT LONG TERM GOAL #3   Title Patient will score >/= 20/30 on the FGA (without an assistive device) to indicate a decrease in the patient's risk of falling. (TARGET DATE: 02/05/2017)    Baseline FGA = 12/30 (performed 12/10/2016)   Time 8   Period Weeks   Status On-going     PT LONG TERM GOAL #4   Title Patient will have a gait velocity of >/= 2.68f/s without an assistive device to indicate a community ambulator. (TARGET DATE: 02/05/2017)    Time 8   Period Weeks   Status On-going     PT LONG TERM GOAL #5   Title Patient will demonstrate ability to independently ambulate 500 feet outdoors (including ramps/curbs/grass) without an  assistive device to indicate a decrease in his risk of falling when out in the community. (TARGET DATE: 02/05/2017)    Time 8   Period Weeks   Status On-going     PT LONG TERM GOAL #6   Title Patient will score >/= 52/56 on the [employer]  Test to indicate a decrease in his risk of falling. (TARGET DATE: 02/05/2017)    Time 8   Period Weeks   Status On-going             Plan - 01/07/17 0855    Clinical Impression Statement Pt had met 3/4 STGs to date and partially met remaining STG. Remainder of session focused on pt performing current HEP with cues on form/technique due to his reports of not having done them "at all" at home. Pt should benefit from continued PT to progress toward unmet goals.                                   Rehab Potential Good   Clinical Impairments Affecting Rehab Potential mild cognitive and memory deficits, bunion surgery, arthritis, atrial fibrillation, DM type 2, gout, obesity, colostomy & reversal, hyperlipidemia    PT Frequency 2x / week   PT Duration 8 weeks   PT Treatment/Interventions ADLs/Self Care Home Management;Electrical Stimulation;Ultrasound;Cognitive remediation;Neuromuscular re-education;Balance training;Therapeutic exercise;Therapeutic activities;Functional mobility training;Stair training;Gait training;DME Instruction;Patient/family education;Manual techniques;Passive range of motion   PT Next Visit Plan continue to advance gait training over outdoor and compliant surfaces; advance balance and LE strengthening exercises    Consulted and Agree with Plan of Care Patient      Patient will benefit from skilled therapeutic intervention in order to improve the following deficits and impairments:  Abnormal gait, Cardiopulmonary status limiting activity, Decreased activity tolerance, Decreased balance, Decreased cognition, Decreased coordination, Decreased safety awareness, Decreased range of motion, Decreased mobility, Decreased knowledge of use of DME,  Decreased endurance, Decreased strength, Difficulty walking, Impaired flexibility, Impaired sensation, Postural dysfunction, Pain  Visit Diagnosis: Muscle weakness (generalized)  Hemiplegia and hemiparesis following cerebral infarction affecting right dominant side (HCC)  Unsteadiness on feet  Other abnormalities of gait and mobility     Problem List Patient Active Problem List   Diagnosis Date Noted  . Late effect of cerebrovascular accident (CVA) 09/30/2016  . Paroxysmal atrial fibrillation ([place]) 09/23/2016  . Status post placement of implantable loop recorder   . Acute blood loss anemia   . Hypoalbuminemia due to protein-calorie malnutrition ([place])   . Labile blood pressure   . Acute ischemic left MCA stroke ([place]) 09/02/2016  . Ataxia, post-stroke   . Adjustment disorder with mixed anxiety and depressed mood   . Status post right foot surgery   . History of gout   . Primary insomnia   . Acute ischemic stroke ([place])   . Idiopathic gout   . Morbid obesity ([place])   . Diastolic dysfunction   . Benign essential HTN   . Diabetes mellitus ([place])   . Leukocytosis   . CVA (cerebral vascular accident) ([place]) 08/30/2016  . Stroke (cerebrum) ([place]) 08/30/2016  . 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 Hammertoe of right foot 07/20/2016  . Hallux valgus, acquired, bilateral 02/19/2014  . Gout 02/13/2013  . Hyperlipidemia 01/18/2012  . Obesity 01/18/2012    Willow Ora, PTA, [place] [address] [place], [place] [address] [phone] 01/07/17, 9:14 PM   Name: Gedeon Brandow MRN: [id number] Date of Birth: 1946/04/14

## 2017-01-08 ENCOUNTER — Encounter: Payer: Self-pay | Admitting: Physical Medicine & Rehabilitation

## 2017-01-11 ENCOUNTER — Encounter: Payer: Self-pay | Admitting: Physical Therapy

## 2017-01-11 ENCOUNTER — Encounter: Payer: Federal, State, Local not specified - PPO | Admitting: Occupational Therapy

## 2017-01-11 ENCOUNTER — Ambulatory Visit
Payer: Federal, State, Local not specified - PPO | Attending: Physical Medicine & Rehabilitation | Admitting: Physical Therapy

## 2017-01-11 DIAGNOSIS — R2689 Other abnormalities of gait and mobility: Secondary | ICD-10-CM | POA: Diagnosis present

## 2017-01-11 DIAGNOSIS — M6281 Muscle weakness (generalized): Secondary | ICD-10-CM | POA: Diagnosis not present

## 2017-01-11 DIAGNOSIS — R278 Other lack of coordination: Secondary | ICD-10-CM | POA: Insufficient documentation

## 2017-01-11 DIAGNOSIS — R2681 Unsteadiness on feet: Secondary | ICD-10-CM | POA: Insufficient documentation

## 2017-01-11 DIAGNOSIS — I69351 Hemiplegia and hemiparesis following cerebral infarction affecting right dominant side: Secondary | ICD-10-CM | POA: Insufficient documentation

## 2017-01-11 NOTE — Patient Instructions (Signed)
Hypoglycemia Hypoglycemia is when the sugar (glucose) level in the blood is too low. Symptoms of low blood sugar may include:  Feeling: ? Hungry. ? Worried or nervous (anxious). ? Sweaty and clammy. ? Confused. ? Dizzy. ? Sleepy. ? Sick to your stomach (nauseous).  Having: ? A fast heartbeat. ? A headache. ? A change in your vision. ? Jerky movements that you cannot control (seizure). ? Nightmares. ? Tingling or no feeling (numbness) around the mouth, lips, or tongue.  Having trouble with: ? Talking. ? Paying attention (concentrating). ? Moving (coordination). ? Sleeping.  Shaking.  Passing out (fainting).  Getting upset easily (irritability).  Low blood sugar can happen to people who have diabetes and people who do not have diabetes. Low blood sugar can happen quickly, and it can be an emergency. Treating Low Blood Sugar Low blood sugar is often treated by eating or drinking something sugary right away. If you can think clearly and swallow safely, follow the 15:15 rule:  Take 15 grams of a fast-acting carb (carbohydrate). Some fast-acting carbs are: ? 1 tube of glucose gel. ? 3 sugar tablets (glucose pills). ? 6-8 pieces of hard candy. ? 4 oz (120 mL) of fruit juice. ? 4 oz (120 mL) of regular (not diet) soda.  Check your blood sugar 15 minutes after you take the carb.  If your blood sugar is still at or below 70 mg/dL (3.9 mmol/L), take 15 grams of a carb again.  If your blood sugar does not go above 70 mg/dL (3.9 mmol/L) after 3 tries, get help right away.  After your blood sugar goes back to normal, eat a meal or a snack within 1 hour.  Treating Very Low Blood Sugar If your blood sugar is at or below 54 mg/dL (3 mmol/L), you have very low blood sugar (severe hypoglycemia). This is an emergency. Do not wait to see if the symptoms will go away. Get medical help right away. Call your local emergency services (911 in the [place] Do not drive yourself to the  hospital. If you have very low blood sugar and you cannot eat or drink, you may need a glucagon shot (injection). A family member or friend should learn how to check your blood sugar and how to give you a glucagon shot. Ask your doctor if you need to have a glucagon shot kit at home. Follow these instructions at home: General instructions  Avoid any diets that cause you to not eat enough food. Talk with your doctor before you start any new diet.  Take over-the-counter and prescription medicines only as told by your doctor.  Limit alcohol to no more than 1 drink per day for nonpregnant women and 2 drinks per day for men. One drink equals 12 oz of beer, 5 oz of wine, or 1 oz of hard liquor.  Keep all follow-up visits as told by your doctor. This is important. If You Have Diabetes:   Make sure you know the symptoms of low blood sugar.  Always keep a source of sugar with you, such as: ? Sugar. ? Sugar tablets. ? Glucose gel. ? Fruit juice. ? Regular soda (not diet soda). ? Milk. ? Hard candy. ? Honey.  Take your medicines as told.  Follow your exercise and meal plan. ? Eat on time. Do not skip meals. ? Follow your sick day plan when you cannot eat or drink normally. Make this plan ahead of time with your doctor.  Check your blood sugar as often   as told by your doctor. Always check before and after exercise.  Share your diabetes care plan with: ? Your work or school. ? People you live with.  Check your pee (urine) for ketones: ? When you are sick. ? As told by your doctor.  Carry a card or wear jewelry that says you have diabetes. If You Have Low Blood Sugar From Other Causes:   Check your blood sugar as often as told by your doctor.  Follow instructions from your doctor about what you cannot eat or drink. Contact a doctor if:  You have trouble keeping your blood sugar in your target range.  You have low blood sugar often. Get help right away if:  You still have  symptoms after you eat or drink something sugary.  Your blood sugar is at or below 54 mg/dL (3 mmol/L).  You have jerky movements that you cannot control.  You pass out. These symptoms may be an emergency. Do not wait to see if the symptoms will go away. Get medical help right away. Call your local emergency services (911 in the [place] Do not drive yourself to the hospital. This information is not intended to replace advice given to you by your health care provider. Make sure you discuss any questions you have with your health care provider. Document Released: 10/21/2009 Document Revised: 01/02/2016 Document Reviewed: 08/30/2015 Elsevier Interactive Patient Education  [employer].

## 2017-01-11 NOTE — Therapy (Signed)
[facility] [address] [place], [place], [address] Phone: [phone]   Fax:  [phone]  Physical Therapy Treatment  Patient Details  Name: Aaron Moon MRN: [id number] Date of Birth: 1945-10-04 Referring Provider: Alysia Penna MD   Encounter Date: 01/11/2017      PT End of Session - 01/11/17 1154    Visit Number 11   Number of Visits 18   Date for PT Re-Evaluation 02/05/17   Authorization Type Federal [employer]    PT Start Time 0930   PT Stop Time 1015   PT Time Calculation (min) 45 min   Equipment Utilized During Treatment Gait belt   Activity Tolerance Patient tolerated treatment well   Behavior During Therapy [facility] for tasks assessed/performed      Past Medical History:  Diagnosis Date  . Arthritis   . Atrial fibrillation ([place])   . Diabetes mellitus without complication ([place])   . Gout   . Hyperlipidemia   . Obesity   . Stroke [facility])     Past Surgical History:  Procedure Laterality Date  . ABDOMINAL SURGERY     colostomy & colostomy reversal  . ANKLE SURGERY    . EP IMPLANTABLE DEVICE N/A 09/02/2016   Procedure: Loop Recorder Insertion;  Surgeon: Deboraha Sprang, MD;  Location: [facility];  Service: Cardiovascular;  Laterality: N/A;  . TEE WITHOUT CARDIOVERSION N/A 09/02/2016   Procedure: TRANSESOPHAGEAL ECHOCARDIOGRAM (TEE);  Surgeon: Larey Dresser, MD;  Location: [facility];  Service: Cardiovascular;  Laterality: N/A;    There were no vitals filed for this visit.      Subjective Assessment - 01/11/17 0934    Subjective Patient denies any new complaints or falls since last visit.    Limitations Lifting;Standing;Walking;House hold activities   Patient Stated Goals to walk better/with more ease    Currently in Pain? No/denies  reports mild, intermittent neuropathic pain                         [facility] Adult PT Treatment/Exercise - 01/11/17 0930      Transfers    Transfers Sit to Stand;Stand to Sit   Sit to Stand 6: Modified independent (Device/Increase time);With upper extremity assist;From bed;From chair/3-in-1   Stand to Sit 6: Modified independent (Device/Increase time);With upper extremity assist;To bed;To chair/3-in-1     Ambulation/Gait   Ambulation/Gait Yes   Ambulation/Gait Assistance 5: Supervision   Ambulation/Gait Assistance Details patient ambulated short, indoor distances between activities in PT gym    Assistive device None   Gait Pattern Step-to pattern;Decreased arm swing - right;Decreased step length - left;Decreased stance time - right;Decreased stride length;Decreased dorsiflexion - right;Decreased weight shift to right;Trendelenburg;Trunk flexed;Abducted - left;Poor foot clearance - right   Ambulation Surface Level;Indoor   Stairs --   Stairs Assistance --   Stair Management Technique --   Number of Stairs --     Neuro Re-ed    Neuro Re-ed Details  In parallel bars: PT instructed patient in performing forward step ups onto 8 inch step with unilateral UE support. Progressed to forward step ups onto 4 inch block without UE support, but with min guard from PT.  Progressed to side step ups on 4 inch block with BUE. Progressed to unilateral UE support.  PT required to provide cueing for sequencing ("R foot, L foot") and min guard with intermittent min A due to misstepping and fatigue with resulting balance loss.  PT Education - 01/11/17 0949    Education provided Yes   Education Details PT educated patient on importance of taking Metformin and Lipitor with food; educated blood sugar regulation via medications and nutrition and hypoglycemia - emphasizing to patient importance of medications/nutrition on his balance    Person(s) Educated Patient   Methods Explanation;Handout  handout on hypoglycemia    Comprehension Verbalized understanding;Need further instruction          PT Short Term Goals - 01/11/17  1318      PT SHORT TERM GOAL #1   Title Patient will verbalize understanding of initial HEP. (TARGET DATE: 01/08/2017)   Baseline 01/07/17: pt able to verbalize HEP, however reports he has not been performing it at all   Status Partially Met     PT [place] #2   Title Patient will have a TUG score </=13.5 to indicate a decrease in his risk of falling. (TARGET DATE: 01/08/2017)    Baseline 01/07/17: met today with score of    Status Achieved     PT SHORT TERM GOAL #3   Title Patient will demonstrate ability to stand on LLE without UE support for >/=5 seconds to indicate a decrease in his risk of falling. (TARGET DATE: 01/08/2017)    Baseline 01/07/17: met with >/= 5 second's on LLE, ~3 seconds on RLE   Status Achieved     PT SHORT TERM GOAL #4   Title Patient will demonstrate ability to ascend/descend 4 stairs with unilateral UE support to indicate a decrease in his risk of falling when ambulating on stairs in the community. (TARGET DATE: 01/08/2017)    Baseline 01/07/17: met today   Status Achieved           PT Long Term Goals - 01/11/17 1318      PT LONG TERM GOAL #1   Title Patient will verbalize understanding of onogoing HEP. (TARGET DATE: 02/05/2017)    Time 8   Period Weeks   Status On-going     PT LONG TERM GOAL #2   Title Patient will ambulate >/= 834f during 6 Minute Walk Test without an assistive device to indicate an increase in patient's endurance. (TARGET DATE: 02/05/2017)    Time 8   Period Weeks   Status On-going     PT LONG TERM GOAL #3   Title Patient will score >/= 20/30 on the FGA (without an assistive device) to indicate a decrease in the patient's risk of falling. (TARGET DATE: 02/05/2017)    Baseline FGA = 12/30 (performed 12/10/2016)   Time 8   Period Weeks   Status On-going     PT LONG TERM GOAL #4   Title Patient will have a gait velocity of >/= 2.684fs without an assistive device to indicate a community ambulator. (TARGET DATE: 02/05/2017)    Time 8    Period Weeks   Status On-going     PT LONG TERM GOAL #5   Title Patient will demonstrate ability to independently ambulate 500 feet outdoors (including ramps/curbs/grass) without an assistive device to indicate a decrease in his risk of falling when out in the community. (TARGET DATE: 02/05/2017)    Time 8   Period Weeks   Status On-going     PT LONG TERM GOAL #6   Title Patient will score >/= 52/56 on the Berg Balance Test to indicate a decrease in his risk of falling. (TARGET DATE: 02/05/2017)    Time 8   Period Weeks  Status On-going               Plan - 01/11/17 1200    Clinical Impression Statement Today's skilled PT session focused on advancing patient's LE strengthening and balance activities and education. PT requires multiple seated rest breaks during LE strengthening and balance activities due to fatigue. Patient inquired about signs/symptoms of "low blood sugar", and PT educated patient on nutrition and medication adherence as it relates to patient's risk of falling and imbalance when blood sugars are not stable.  Patient is making progress towards goals, and will benefit from continued skilled PT to address functional mobility deficits.    Rehab Potential Good   Clinical Impairments Affecting Rehab Potential mild cognitive and memory deficits, bunion surgery, arthritis, atrial fibrillation, DM type 2, gout, obesity, colostomy & reversal, hyperlipidemia    PT Frequency 2x / week   PT Duration 8 weeks   PT Treatment/Interventions ADLs/Self Care Home Management;Electrical Stimulation;Ultrasound;Cognitive remediation;Neuromuscular re-education;Balance training;Therapeutic exercise;Therapeutic activities;Functional mobility training;Stair training;Gait training;DME Instruction;Patient/family education;Manual techniques;Passive range of motion   PT Next Visit Plan advance LE strengthening and balance exercises; ambulation on compliant/outdoor surfaces + attention demanding  tasks    Consulted and Agree with Plan of Care Patient      Patient will benefit from skilled therapeutic intervention in order to improve the following deficits and impairments:  Abnormal gait, Cardiopulmonary status limiting activity, Decreased activity tolerance, Decreased balance, Decreased cognition, Decreased coordination, Decreased safety awareness, Decreased range of motion, Decreased mobility, Decreased knowledge of use of DME, Decreased endurance, Decreased strength, Difficulty walking, Impaired flexibility, Impaired sensation, Postural dysfunction, Pain  Visit Diagnosis: Muscle weakness (generalized)  Unsteadiness on feet  Other abnormalities of gait and mobility  Other lack of coordination  Hemiplegia and hemiparesis following cerebral infarction affecting right dominant side [facility])     Problem List Patient Active Problem List   Diagnosis Date Noted  . Late effect of cerebrovascular accident (CVA) 09/30/2016  . Paroxysmal atrial fibrillation ([place]) 09/23/2016  . Status post placement of implantable loop recorder   . Acute blood loss anemia   . Hypoalbuminemia due to protein-calorie malnutrition ([place])   . Labile blood pressure   . Acute ischemic left MCA stroke ([place]) 09/02/2016  . Ataxia, post-stroke   . Adjustment disorder with mixed anxiety and depressed mood   . Status post right foot surgery   . History of gout   . Primary insomnia   . Acute ischemic stroke ([place])   . Idiopathic gout   . Morbid obesity ([employer])   . Diastolic dysfunction   . Benign essential HTN   . Diabetes mellitus ([place])   . Leukocytosis   . CVA (cerebral vascular accident) ([place]) 08/30/2016  . Stroke (cerebrum) ([place]) 08/30/2016  . 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 Hammertoe of right foot 07/20/2016  . Hallux valgus, acquired, bilateral 02/19/2014  . Gout 02/13/2013  . Hyperlipidemia 01/18/2012  . Obesity 01/18/2012    Arelia Sneddon, SPT  01/11/2017, 1:46 PM  [facility] [address] [place], [place], [address] Phone: [phone]   Fax:  [phone]  Name: Aaron Moon MRN: [id number] Date of Birth: 07-31-1946

## 2017-01-14 ENCOUNTER — Ambulatory Visit: Payer: Federal, State, Local not specified - PPO | Admitting: Physical Therapy

## 2017-01-14 ENCOUNTER — Encounter: Payer: Self-pay | Admitting: Physical Therapy

## 2017-01-14 ENCOUNTER — Encounter: Payer: Federal, State, Local not specified - PPO | Admitting: Occupational Therapy

## 2017-01-14 DIAGNOSIS — R2689 Other abnormalities of gait and mobility: Secondary | ICD-10-CM

## 2017-01-14 DIAGNOSIS — M6281 Muscle weakness (generalized): Secondary | ICD-10-CM

## 2017-01-14 DIAGNOSIS — R278 Other lack of coordination: Secondary | ICD-10-CM

## 2017-01-14 DIAGNOSIS — R2681 Unsteadiness on feet: Secondary | ICD-10-CM

## 2017-01-14 NOTE — Therapy (Signed)
[facility] [address] [place], [place], [address] Phone: [phone]   Fax:  [phone]  Physical Therapy Treatment  Patient Details  Name: Aaron Moon MRN: [id number] Date of Birth: [birth year] Referring Provider: Alysia Penna MD   Encounter Date: 01/14/2017      PT End of Session - 01/14/17 1142    Visit Number 12   Number of Visits 18   Date for PT Re-Evaluation 02/05/17   Authorization Type Federal BCBS    PT Start Time [phone]   PT Stop Time 1015   PT Time Calculation (min) 42 min   Equipment Utilized During Treatment Gait belt   Activity Tolerance Patient tolerated treatment well   Behavior During Therapy [facility] for tasks assessed/performed      Past Medical History:  Diagnosis Date  . Arthritis   . Atrial fibrillation ([place])   . Diabetes mellitus without complication ([place])   . Gout   . Hyperlipidemia   . Obesity   . Stroke [facility])     Past Surgical History:  Procedure Laterality Date  . ABDOMINAL SURGERY     colostomy & colostomy reversal  . ANKLE SURGERY    . EP IMPLANTABLE DEVICE N/A 09/02/2016   Procedure: Loop Recorder Insertion;  Surgeon: Deboraha Sprang, MD;  Location: [facility];  Service: Cardiovascular;  Laterality: N/A;  . TEE WITHOUT CARDIOVERSION N/A 09/02/2016   Procedure: TRANSESOPHAGEAL ECHOCARDIOGRAM (TEE);  Surgeon: Larey Dresser, MD;  Location: [facility];  Service: Cardiovascular;  Laterality: N/A;    There were no vitals filed for this visit.      Subjective Assessment - 01/14/17 0936    Subjective Patient denies any new complaints or falls.    Limitations Lifting;Standing;Walking;House hold activities   Patient Stated Goals to walk better/with more ease    Currently in Pain? No/denies                         [facility] Adult PT Treatment/Exercise - 01/14/17 0930      Transfers   Transfers Sit to Stand;Stand to Sit   Sit to Stand 6: Modified  independent (Device/Increase time);With upper extremity assist;From bed;From chair/3-in-1   Stand to Sit 6: Modified independent (Device/Increase time);With upper extremity assist;To bed;To chair/3-in-1     Ambulation/Gait   Ambulation/Gait Yes   Ambulation/Gait Assistance 5: Supervision   Ambulation/Gait Assistance Details Requires cueing for step length and maintained gait velocity. Patient ambulated 742f in 4 minutes 30seconds, and all of the ambulation on outdoor, paved surfaces with supervision except for a couple of steps on unlevel grass + curb where the patient required mod A. At end of 7031fwalk, patient's HR = 88bpm and RR = 24.    Ambulation Distance (Feet) 700 Feet   Assistive device None   Gait Pattern Step-to pattern;Decreased arm swing - right;Decreased step length - left;Decreased stance time - right;Decreased stride length;Decreased dorsiflexion - right;Decreased weight shift to right;Trendelenburg;Trunk flexed;Abducted - left;Poor foot clearance - right   Ambulation Surface Level;Unlevel;Indoor;Outdoor;Paved;Grass   Curb 3: Mod assist   Curb Details (indicate cue type and reason) Patient was stepping down a curb from a grassy surface at the end of PT session. Due to fatigue and imbalance, patient required mod A.     Neuro Re-ed    Neuro Re-ed Details  In parallel bars: PT instructed patient in balance activity on blue foam strip. Patient performed 2, 30s holds without UE support,  but required intermittent tactile cueing to maintain balance. Progressed to 10s static standing balance without UE support + EC+ foam strip. Required intermittent tactile cueing to maintain balance. Progressed to forward/backward step off of foam strip with light unilateral UE support and min guard from PT with intermittent min A due to misstep and fatigue. Performed 10 reps with each foot in each direction. Progressed to rockerboard + EC for 10s with intermittent tactile cueing to maintain balance.  Progressed to anterior/posterior tilt + hold 3s requiring demo and cueing for proper technique and tactile cueing for maintained balance.                 PT Education - 01/14/17 1140    Education provided Yes   Education Details increase endurance activities by walking 5 minutes (with available places for seated rest breaks in case he needs to rest) followed by a 5 minute rest, then repeat this cycle 3 times to increase endurance    Person(s) Educated Patient   Methods Explanation   Comprehension Verbalized understanding          PT Short Term Goals - 01/11/17 1318      PT SHORT TERM GOAL #1   Title Patient will verbalize understanding of initial HEP. (TARGET DATE: 01/08/2017)   Baseline 01/07/17: pt able to verbalize HEP, however reports he has not been performing it at all   Status Partially Met     PT [place] #2   Title Patient will have a TUG score </=13.5 to indicate a decrease in his risk of falling. (TARGET DATE: 01/08/2017)    Baseline 01/07/17: met today with score of    Status Achieved     PT SHORT TERM GOAL #3   Title Patient will demonstrate ability to stand on LLE without UE support for >/=5 seconds to indicate a decrease in his risk of falling. (TARGET DATE: 01/08/2017)    Baseline 01/07/17: met with >/= 5 second's on LLE, ~3 seconds on RLE   Status Achieved     PT SHORT TERM GOAL #4   Title Patient will demonstrate ability to ascend/descend 4 stairs with unilateral UE support to indicate a decrease in his risk of falling when ambulating on stairs in the community. (TARGET DATE: 01/08/2017)    Baseline 01/07/17: met today   Status Achieved           PT Long Term Goals - 01/11/17 1318      PT LONG TERM GOAL #1   Title Patient will verbalize understanding of onogoing HEP. (TARGET DATE: 02/05/2017)    Time 8   Period Weeks   Status On-going     PT LONG TERM GOAL #2   Title Patient will ambulate >/= 853f during 6 Minute Walk Test without an assistive  device to indicate an increase in patient's endurance. (TARGET DATE: 02/05/2017)    Time 8   Period Weeks   Status On-going     PT LONG TERM GOAL #3   Title Patient will score >/= 20/30 on the FGA (without an assistive device) to indicate a decrease in the patient's risk of falling. (TARGET DATE: 02/05/2017)    Baseline FGA = 12/30 (performed 12/10/2016)   Time 8   Period Weeks   Status On-going     PT LONG TERM GOAL #4   Title Patient will have a gait velocity of >/= 2.661fs without an assistive device to indicate a community ambulator. (TARGET DATE: 02/05/2017)    Time 8  Period Weeks   Status On-going     PT LONG TERM GOAL #5   Title Patient will demonstrate ability to independently ambulate 500 feet outdoors (including ramps/curbs/grass) without an assistive device to indicate a decrease in his risk of falling when out in the community. (TARGET DATE: 02/05/2017)    Time 8   Period Weeks   Status On-going     PT LONG TERM GOAL #6   Title Patient will score >/= 52/56 on the Berg Balance Test to indicate a decrease in his risk of falling. (TARGET DATE: 02/05/2017)    Time 8   Period Weeks   Status On-going               Plan - 01/14/17 1146    Clinical Impression Statement Today's skilled PT session focused on advancing patient's balance activities including eliminating vision and minimizing UE support when completing activities on foam and rockerboard. PT worked on patient's endurance by increasing ambulation distance on outdoor, paved surfaces. Patient's HR = 88bpm and RR=24 breaths at end of 7100f walk. Patient was fatigued at the end of today' session. Patient is making progress towards goals, and will benefit from continued skilled PT to address functional mobility deficits.    Rehab Potential Good   Clinical Impairments Affecting Rehab Potential mild cognitive and memory deficits, bunion surgery, arthritis, atrial fibrillation, DM type 2, gout, obesity, colostomy &  reversal, hyperlipidemia    PT Frequency 2x / week   PT Duration 8 weeks   PT Treatment/Interventions ADLs/Self Care Home Management;Electrical Stimulation;Ultrasound;Cognitive remediation;Neuromuscular re-education;Balance training;Therapeutic exercise;Therapeutic activities;Functional mobility training;Stair training;Gait training;DME Instruction;Patient/family education;Manual techniques;Passive range of motion   PT Next Visit Plan ambulation on outdoor surfaces increasing endurance; ambulation on compliant/outdoor surfaces + attention demanding tasks    Consulted and Agree with Plan of Care Patient      Patient will benefit from skilled therapeutic intervention in order to improve the following deficits and impairments:  Abnormal gait, Cardiopulmonary status limiting activity, Decreased activity tolerance, Decreased balance, Decreased cognition, Decreased coordination, Decreased safety awareness, Decreased range of motion, Decreased mobility, Decreased knowledge of use of DME, Decreased endurance, Decreased strength, Difficulty walking, Impaired flexibility, Impaired sensation, Postural dysfunction, Pain  Visit Diagnosis: Muscle weakness (generalized)  Unsteadiness on feet  Other abnormalities of gait and mobility  Other lack of coordination     Problem List Patient Active Problem List   Diagnosis Date Noted  . Late effect of cerebrovascular accident (CVA) 09/30/2016  . Paroxysmal atrial fibrillation ([place] 09/23/2016  . Status post placement of implantable loop recorder   . Acute blood loss anemia   . Hypoalbuminemia due to protein-calorie malnutrition ([place]   . Labile blood pressure   . Acute ischemic left MCA stroke ([place] 09/02/2016  . Ataxia, post-stroke   . Adjustment disorder with mixed anxiety and depressed mood   . Status post right foot surgery   . History of gout   . Primary insomnia   . Acute ischemic stroke ([place]   . Idiopathic gout   . Morbid obesity ([place]   .  Diastolic dysfunction   . Benign essential HTN   . Diabetes mellitus ([place]   . Leukocytosis   . CVA (cerebral vascular accident) ([place] 08/30/2016  . Stroke (cerebrum) ([place] 08/30/2016  . 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 Hammertoe of right foot 07/20/2016  . Hallux valgus, acquired, bilateral 02/19/2014  . Gout 02/13/2013  . Hyperlipidemia 01/18/2012  . Obesity 01/18/2012    VArelia Sneddon  SPT  01/14/2017, 11:48 AM  [facility] [address] [place], [place], [address] Phone: [phone]   Fax:  [phone]  Name: Aaron Moon MRN: [id number] Date of Birth: 1945/09/24

## 2017-01-19 ENCOUNTER — Encounter: Payer: Federal, State, Local not specified - PPO | Admitting: Occupational Therapy

## 2017-01-19 ENCOUNTER — Encounter: Payer: Self-pay | Admitting: Physical Therapy

## 2017-01-19 ENCOUNTER — Ambulatory Visit: Payer: Federal, State, Local not specified - PPO | Admitting: Physical Therapy

## 2017-01-19 DIAGNOSIS — R2681 Unsteadiness on feet: Secondary | ICD-10-CM

## 2017-01-19 DIAGNOSIS — M6281 Muscle weakness (generalized): Secondary | ICD-10-CM

## 2017-01-19 DIAGNOSIS — R2689 Other abnormalities of gait and mobility: Secondary | ICD-10-CM

## 2017-01-20 NOTE — Therapy (Signed)
[facility] [address] [place], [place], [address] Phone: [phone]   Fax:  [phone]  Physical Therapy Treatment  Patient Details  Name: Aaron Moon MRN: [id number] Date of Birth: August 17, 1945 Referring Provider: Alysia Penna MD   Encounter Date: 01/19/2017      PT End of Session - 01/19/17 0853    Visit Number 13   Number of Visits 18   Date for PT Re-Evaluation 02/05/17   Authorization Type Federal BCBS    PT Start Time [phone]   PT Stop Time 0930   PT Time Calculation (min) 42 min   Equipment Utilized During Treatment Gait belt   Activity Tolerance Patient tolerated treatment well   Behavior During Therapy [facility] for tasks assessed/performed      Past Medical History:  Diagnosis Date  . Arthritis   . Atrial fibrillation ([place])   . Diabetes mellitus without complication ([place])   . Gout   . Hyperlipidemia   . Obesity   . Stroke [facility])     Past Surgical History:  Procedure Laterality Date  . ABDOMINAL SURGERY     colostomy & colostomy reversal  . ANKLE SURGERY    . EP IMPLANTABLE DEVICE N/A 09/02/2016   Procedure: Loop Recorder Insertion;  Surgeon: Deboraha Sprang, MD;  Location: [facility];  Service: Cardiovascular;  Laterality: N/A;  . TEE WITHOUT CARDIOVERSION N/A 09/02/2016   Procedure: TRANSESOPHAGEAL ECHOCARDIOGRAM (TEE);  Surgeon: Larey Dresser, MD;  Location: [facility];  Service: Cardiovascular;  Laterality: N/A;    There were no vitals filed for this visit.      Subjective Assessment - 01/19/17 0852    Subjective No new complaints. No falls or pain to report.    Limitations Lifting;Standing;Walking;House hold activities   Patient Stated Goals to walk better/with more ease    Currently in Pain? No/denies   Pain Score 0-No pain            [facility] Adult PT Treatment/Exercise - 01/19/17 1513      Knee/Hip Exercises: Aerobic   Other Aerobic Scifit with UE's/LE's level 5.0 x  8 minutes with goal rpm >/=60 for strengthening and activity tolerance             Balance Exercises - 01/19/17 0917      Balance Exercises: Standing   Rockerboard Anterior/posterior;Lateral;Head turns;EO;EC;30 seconds;10 reps     Balance Exercises: Standing   Rebounder Limitations performed both ways on balance board with no UE support: EO rocking board with emphasis on tall posture; holding board steady with EO: alternating UE raises, bil UE raises, and shoulder angel wings x 10 reps each, progressing to holding board steady EC for 30 sec's x 3 reps, progressing to [facility] for head movements left<>right and up<>down x 10 reps each. min to mod assist for balance with cues on midline posture and weight shifting to assist with balance recovery.                                        PT Short Term Goals - 01/11/17 1318      PT SHORT TERM GOAL #1   Title Patient will verbalize understanding of initial HEP. (TARGET DATE: 01/08/2017)   Baseline 01/07/17: pt able to verbalize HEP, however reports he has not been performing it at all   Status Partially Met     PT SHORT  TERM GOAL #2   Title Patient will have a TUG score </=13.5 to indicate a decrease in his risk of falling. (TARGET DATE: 01/08/2017)    Baseline 01/07/17: met today with score of    Status Achieved     PT SHORT TERM GOAL #3   Title Patient will demonstrate ability to stand on LLE without UE support for >/=5 seconds to indicate a decrease in his risk of falling. (TARGET DATE: 01/08/2017)    Baseline 01/07/17: met with >/= 5 second's on LLE, ~3 seconds on RLE   Status Achieved     PT SHORT TERM GOAL #4   Title Patient will demonstrate ability to ascend/descend 4 stairs with unilateral UE support to indicate a decrease in his risk of falling when ambulating on stairs in the community. (TARGET DATE: 01/08/2017)    Baseline 01/07/17: met today   Status Achieved           PT Long Term Goals - 01/11/17 1318      PT LONG TERM GOAL  #1   Title Patient will verbalize understanding of onogoing HEP. (TARGET DATE: 02/05/2017)    Time 8   Period Weeks   Status On-going     PT LONG TERM GOAL #2   Title Patient will ambulate >/= 852f during 6 Minute Walk Test without an assistive device to indicate an increase in patient's endurance. (TARGET DATE: 02/05/2017)    Time 8   Period Weeks   Status On-going     PT LONG TERM GOAL #3   Title Patient will score >/= 20/30 on the FGA (without an assistive device) to indicate a decrease in the patient's risk of falling. (TARGET DATE: 02/05/2017)    Baseline FGA = 12/30 (performed 12/10/2016)   Time 8   Period Weeks   Status On-going     PT LONG TERM GOAL #4   Title Patient will have a gait velocity of >/= 2.650fs without an assistive device to indicate a community ambulator. (TARGET DATE: 02/05/2017)    Time 8   Period Weeks   Status On-going     PT LONG TERM GOAL #5   Title Patient will demonstrate ability to independently ambulate 500 feet outdoors (including ramps/curbs/grass) without an assistive device to indicate a decrease in his risk of falling when out in the community. (TARGET DATE: 02/05/2017)    Time 8   Period Weeks   Status On-going     PT LONG TERM GOAL #6   Title Patient will score >/= 52/56 on the Berg Balance Test to indicate a decrease in his risk of falling. (TARGET DATE: 02/05/2017)    Time 8   Period Weeks   Status On-going            Plan - 01/19/17 0853    Clinical Impression Statement Today's skilleds session focused on high level balance and activity tolerance without issues. Pt does continue to fatigue quickly with activity, needing short rest breaks. Pt is making steady progress toward goals and should benefit from continued PT to progress toward unmet goals.    Rehab Potential Good   Clinical Impairments Affecting Rehab Potential mild cognitive and memory deficits, bunion surgery, arthritis, atrial fibrillation, DM type 2, gout, obesity,  colostomy & reversal, hyperlipidemia    PT Frequency 2x / week   PT Duration 8 weeks   PT Treatment/Interventions ADLs/Self Care Home Management;Electrical Stimulation;Ultrasound;Cognitive remediation;Neuromuscular re-education;Balance training;Therapeutic exercise;Therapeutic activities;Functional mobility training;Stair training;Gait training;DME Instruction;Patient/family education;Manual techniques;Passive range of motion  PT Next Visit Plan ambulation on outdoor surfaces increasing endurance; ambulation on compliant/outdoor surfaces + attention demanding tasks    Consulted and Agree with Plan of Care Patient      Patient will benefit from skilled therapeutic intervention in order to improve the following deficits and impairments:  Abnormal gait, Cardiopulmonary status limiting activity, Decreased activity tolerance, Decreased balance, Decreased cognition, Decreased coordination, Decreased safety awareness, Decreased range of motion, Decreased mobility, Decreased knowledge of use of DME, Decreased endurance, Decreased strength, Difficulty walking, Impaired flexibility, Impaired sensation, Postural dysfunction, Pain  Visit Diagnosis: Muscle weakness (generalized)  Unsteadiness on feet  Other abnormalities of gait and mobility     Problem List Patient Active Problem List   Diagnosis Date Noted  . Late effect of cerebrovascular accident (CVA) 09/30/2016  . Paroxysmal atrial fibrillation ([place]) 09/23/2016  . Status post placement of implantable loop recorder   . Acute blood loss anemia   . Hypoalbuminemia due to protein-calorie malnutrition ([place])   . Labile blood pressure   . Acute ischemic left MCA stroke ([place]) 09/02/2016  . Ataxia, post-stroke   . Adjustment disorder with mixed anxiety and depressed mood   . Status post right foot surgery   . History of gout   . Primary insomnia   . Acute ischemic stroke ([place])   . Idiopathic gout   . Morbid obesity ([place])   . Diastolic  dysfunction   . Benign essential HTN   . Diabetes mellitus ([place])   . Leukocytosis   . CVA (cerebral vascular accident) ([place]) 08/30/2016  . Stroke (cerebrum) ([place]) 08/30/2016  . 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 Hammertoe of right foot 07/20/2016  . Hallux valgus, acquired, bilateral 02/19/2014  . Gout 02/13/2013  . Hyperlipidemia 01/18/2012  . Obesity 01/18/2012    Willow Ora, PTA, [facility] [address] [place], [place] [address] [phone] 01/20/17, 3:23 PM   Name: Aaron Moon MRN: [id number] Date of Birth: 1946-07-29

## 2017-01-21 ENCOUNTER — Encounter: Payer: Self-pay | Admitting: Physical Therapy

## 2017-01-21 ENCOUNTER — Encounter: Payer: Federal, State, Local not specified - PPO | Admitting: Occupational Therapy

## 2017-01-21 ENCOUNTER — Ambulatory Visit: Payer: Federal, State, Local not specified - PPO | Admitting: Physical Therapy

## 2017-01-21 DIAGNOSIS — R2681 Unsteadiness on feet: Secondary | ICD-10-CM

## 2017-01-21 DIAGNOSIS — R2689 Other abnormalities of gait and mobility: Secondary | ICD-10-CM

## 2017-01-21 DIAGNOSIS — M6281 Muscle weakness (generalized): Secondary | ICD-10-CM

## 2017-01-22 NOTE — Therapy (Signed)
[facility] [address] [place], [place], [address] Phone: [phone]   Fax:  [phone]  Physical Therapy Treatment  Patient Details  Name: Aaron Moon MRN: [id number] Date of Birth: [birth year] Referring Provider: Alysia Penna MD   Encounter Date: 01/21/2017      PT End of Session - 01/21/17 0853    Visit Number 14   Number of Visits 18   Date for PT Re-Evaluation 02/05/17   Authorization Type Federal [employer]    PT Start Time [phone]   PT Stop Time 0930   PT Time Calculation (min) 41 min   Equipment Utilized During Treatment Gait belt   Activity Tolerance Patient tolerated treatment well   Behavior During Therapy [facility] for tasks assessed/performed      Past Medical History:  Diagnosis Date  . Arthritis   . Atrial fibrillation ([place])   . Diabetes mellitus without complication ([place])   . Gout   . Hyperlipidemia   . Obesity   . Stroke [facility])     Past Surgical History:  Procedure Laterality Date  . ABDOMINAL SURGERY     colostomy & colostomy reversal  . ANKLE SURGERY    . EP IMPLANTABLE DEVICE N/A 09/02/2016   Procedure: Loop Recorder Insertion;  Surgeon: Deboraha Sprang, MD;  Location: [facility];  Service: Cardiovascular;  Laterality: N/A;  . TEE WITHOUT CARDIOVERSION N/A 09/02/2016   Procedure: TRANSESOPHAGEAL ECHOCARDIOGRAM (TEE);  Surgeon: Larey Dresser, MD;  Location: [facility];  Service: Cardiovascular;  Laterality: N/A;    There were no vitals filed for this visit.      Subjective Assessment - 01/21/17 0853    Subjective No new complaints. No falls or pain to report.    Limitations Lifting;Standing;Walking;House hold activities   Patient Stated Goals to walk better/with more ease    Currently in Pain? No/denies   Pain Score 0-No pain            [facility] Adult PT Treatment/Exercise - 01/21/17 0854      Transfers   Transfers Sit to Stand;Stand to Sit   Sit to Stand 6: Modified  independent (Device/Increase time);With upper extremity assist;From bed;From chair/3-in-1   Stand to Sit 6: Modified independent (Device/Increase time);With upper extremity assist;To bed;To chair/3-in-1     Ambulation/Gait   Ambulation/Gait Yes   Ambulation/Gait Assistance 5: Supervision;4: Min guard   Ambulation/Gait Assistance Details had pt naming animals from A-Z during gait with increased shuffling noted on grass when having to concentrate. needed decriptive cues for 90% of letters to come up with animal.                      Ambulation Distance (Feet) 800 Feet   Assistive device None   Gait Pattern Step-to pattern;Decreased arm swing - right;Decreased step length - left;Decreased stance time - right;Decreased stride length;Decreased dorsiflexion - right;Decreased weight shift to right;Trendelenburg;Trunk flexed;Abducted - left;Poor foot clearance - right   Ambulation Surface Level;Unlevel;Indoor;Outdoor;Paved;Gravel;Grass     Neuro Re-ed    Neuro Re-ed Details  gait around track while tossing hankerchief back and forth while naming foods A-Z with supervision, cues to keep walking, keep tossing hankerchief and descriptive cues needed for names ~80% of times.                                     Balance Exercises - 01/21/17 [phone]  Balance Exercises: Standing   Balance Beam standing with feet across blue foam beam: alternating fwd toe taps to foam bubbles x 10 reps each leg, then alternating cross toe taps to foam bubbles x 10 reps. no UE support, cues on posture, stance position and weight shifting with activity.              PT Short Term Goals - 01/11/17 1318      PT SHORT TERM GOAL #1   Title Patient will verbalize understanding of initial HEP. (TARGET DATE: 01/08/2017)   Baseline 01/07/17: pt able to verbalize HEP, however reports he has not been performing it at all   Status Partially Met     PT [place] #2   Title Patient will have a TUG score </=13.5 to  indicate a decrease in his risk of falling. (TARGET DATE: 01/08/2017)    Baseline 01/07/17: met today with score of    Status Achieved     PT SHORT TERM GOAL #3   Title Patient will demonstrate ability to stand on LLE without UE support for >/=5 seconds to indicate a decrease in his risk of falling. (TARGET DATE: 01/08/2017)    Baseline 01/07/17: met with >/= 5 second's on LLE, ~3 seconds on RLE   Status Achieved     PT SHORT TERM GOAL #4   Title Patient will demonstrate ability to ascend/descend 4 stairs with unilateral UE support to indicate a decrease in his risk of falling when ambulating on stairs in the community. (TARGET DATE: 01/08/2017)    Baseline 01/07/17: met today   Status Achieved           PT Long Term Goals - 01/11/17 1318      PT LONG TERM GOAL #1   Title Patient will verbalize understanding of onogoing HEP. (TARGET DATE: 02/05/2017)    Time 8   Period Weeks   Status On-going     PT LONG TERM GOAL #2   Title Patient will ambulate >/= 840f during 6 Minute Walk Test without an assistive device to indicate an increase in patient's endurance. (TARGET DATE: 02/05/2017)    Time 8   Period Weeks   Status On-going     PT LONG TERM GOAL #3   Title Patient will score >/= 20/30 on the FGA (without an assistive device) to indicate a decrease in the patient's risk of falling. (TARGET DATE: 02/05/2017)    Baseline FGA = 12/30 (performed 12/10/2016)   Time 8   Period Weeks   Status On-going     PT LONG TERM GOAL #4   Title Patient will have a gait velocity of >/= 2.620fs without an assistive device to indicate a community ambulator. (TARGET DATE: 02/05/2017)    Time 8   Period Weeks   Status On-going     PT LONG TERM GOAL #5   Title Patient will demonstrate ability to independently ambulate 500 feet outdoors (including ramps/curbs/grass) without an assistive device to indicate a decrease in his risk of falling when out in the community. (TARGET DATE: 02/05/2017)    Time 8    Period Weeks   Status On-going     PT LONG TERM GOAL #6   Title Patient will score >/= 52/56 on the Berg Balance Test to indicate a decrease in his risk of falling. (TARGET DATE: 02/05/2017)    Time 8   Period Weeks   Status On-going            Plan -  01/21/17 0853    Clinical Impression Statement Today's skilled session focused on high level balance with dual tasking activities. Pt demo'd difficulty with dual tasking, needing cues to attend to each task through out activity. Pt is progressing toward goals and should benefit from continued PT to progress toward unmet goals.                         Rehab Potential Good   Clinical Impairments Affecting Rehab Potential mild cognitive and memory deficits, bunion surgery, arthritis, atrial fibrillation, DM type 2, gout, obesity, colostomy & reversal, hyperlipidemia    PT Frequency 2x / week   PT Duration 8 weeks   PT Treatment/Interventions ADLs/Self Care Home Management;Electrical Stimulation;Ultrasound;Cognitive remediation;Neuromuscular re-education;Balance training;Therapeutic exercise;Therapeutic activities;Functional mobility training;Stair training;Gait training;DME Instruction;Patient/family education;Manual techniques;Passive range of motion   PT Next Visit Plan ambulation on outdoor surfaces increasing endurance; ambulation on compliant/outdoor surfaces + attention demanding tasks    Consulted and Agree with Plan of Care Patient      Patient will benefit from skilled therapeutic intervention in order to improve the following deficits and impairments:  Abnormal gait, Cardiopulmonary status limiting activity, Decreased activity tolerance, Decreased balance, Decreased cognition, Decreased coordination, Decreased safety awareness, Decreased range of motion, Decreased mobility, Decreased knowledge of use of DME, Decreased endurance, Decreased strength, Difficulty walking, Impaired flexibility, Impaired sensation, Postural dysfunction,  Pain  Visit Diagnosis: Muscle weakness (generalized)  Unsteadiness on feet  Other abnormalities of gait and mobility     Problem List Patient Active Problem List   Diagnosis Date Noted  . Late effect of cerebrovascular accident (CVA) 09/30/2016  . Paroxysmal atrial fibrillation ([place]) 09/23/2016  . Status post placement of implantable loop recorder   . Acute blood loss anemia   . Hypoalbuminemia due to protein-calorie malnutrition ([place])   . Labile blood pressure   . Acute ischemic left MCA stroke ([place]) 09/02/2016  . Ataxia, post-stroke   . Adjustment disorder with mixed anxiety and depressed mood   . Status post right foot surgery   . History of gout   . Primary insomnia   . Acute ischemic stroke ([place])   . Idiopathic gout   . Morbid obesity ([place])   . Diastolic dysfunction   . Benign essential HTN   . Diabetes mellitus ([place])   . Leukocytosis   . CVA (cerebral vascular accident) ([place]) 08/30/2016  . Stroke (cerebrum) ([place]) 08/30/2016  . 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 Hammertoe of right foot 07/20/2016  . Hallux valgus, acquired, bilateral 02/19/2014  . Gout 02/13/2013  . Hyperlipidemia 01/18/2012  . Obesity 01/18/2012    Willow Ora, PTA, [facility] [address] [place], [place] [address] [phone] 01/22/17, 8:18 AM   Name: Kylin Dubs MRN: [id number] Date of Birth: 1946/05/24

## 2017-01-26 ENCOUNTER — Encounter: Payer: Federal, State, Local not specified - PPO | Attending: Physical Medicine & Rehabilitation

## 2017-01-26 ENCOUNTER — Ambulatory Visit (HOSPITAL_BASED_OUTPATIENT_CLINIC_OR_DEPARTMENT_OTHER): Payer: Federal, State, Local not specified - PPO | Admitting: Physical Medicine & Rehabilitation

## 2017-01-26 ENCOUNTER — Encounter: Payer: Self-pay | Admitting: Physical Therapy

## 2017-01-26 ENCOUNTER — Encounter: Payer: Self-pay | Admitting: Physical Medicine & Rehabilitation

## 2017-01-26 ENCOUNTER — Ambulatory Visit: Payer: Federal, State, Local not specified - PPO | Admitting: Physical Therapy

## 2017-01-26 VITALS — BP 109/77 | HR 91

## 2017-01-26 DIAGNOSIS — Z933 Colostomy status: Secondary | ICD-10-CM | POA: Diagnosis not present

## 2017-01-26 DIAGNOSIS — G479 Sleep disorder, unspecified: Secondary | ICD-10-CM | POA: Diagnosis not present

## 2017-01-26 DIAGNOSIS — M6281 Muscle weakness (generalized): Secondary | ICD-10-CM | POA: Diagnosis not present

## 2017-01-26 DIAGNOSIS — I6930 Unspecified sequelae of cerebral infarction: Secondary | ICD-10-CM | POA: Insufficient documentation

## 2017-01-26 DIAGNOSIS — Z87891 Personal history of nicotine dependence: Secondary | ICD-10-CM | POA: Diagnosis not present

## 2017-01-26 DIAGNOSIS — R278 Other lack of coordination: Secondary | ICD-10-CM

## 2017-01-26 DIAGNOSIS — I10 Essential (primary) hypertension: Secondary | ICD-10-CM | POA: Insufficient documentation

## 2017-01-26 DIAGNOSIS — Z9889 Other specified postprocedural states: Secondary | ICD-10-CM | POA: Diagnosis not present

## 2017-01-26 DIAGNOSIS — M109 Gout, unspecified: Secondary | ICD-10-CM | POA: Diagnosis not present

## 2017-01-26 DIAGNOSIS — I69398 Other sequelae of cerebral infarction: Secondary | ICD-10-CM

## 2017-01-26 DIAGNOSIS — Z820 Family history of epilepsy and other diseases of the nervous system: Secondary | ICD-10-CM | POA: Insufficient documentation

## 2017-01-26 DIAGNOSIS — I69351 Hemiplegia and hemiparesis following cerebral infarction affecting right dominant side: Secondary | ICD-10-CM | POA: Diagnosis not present

## 2017-01-26 DIAGNOSIS — I69393 Ataxia following cerebral infarction: Secondary | ICD-10-CM | POA: Insufficient documentation

## 2017-01-26 DIAGNOSIS — Z800 Family history of malignant neoplasm of digestive organs: Secondary | ICD-10-CM | POA: Insufficient documentation

## 2017-01-26 DIAGNOSIS — E785 Hyperlipidemia, unspecified: Secondary | ICD-10-CM | POA: Insufficient documentation

## 2017-01-26 DIAGNOSIS — E1159 Type 2 diabetes mellitus with other circulatory complications: Secondary | ICD-10-CM | POA: Diagnosis not present

## 2017-01-26 DIAGNOSIS — R269 Unspecified abnormalities of gait and mobility: Secondary | ICD-10-CM | POA: Diagnosis not present

## 2017-01-26 DIAGNOSIS — I69392 Facial weakness following cerebral infarction: Secondary | ICD-10-CM | POA: Insufficient documentation

## 2017-01-26 DIAGNOSIS — R2681 Unsteadiness on feet: Secondary | ICD-10-CM

## 2017-01-26 DIAGNOSIS — R2689 Other abnormalities of gait and mobility: Secondary | ICD-10-CM

## 2017-01-26 NOTE — Patient Instructions (Addendum)
Walking program: Complete 5 minutes walking then 5 minutes resting. Repeat for 3 cycles. Complete walking program 5 days/week. Don't take your 2 days off each week on back-to-back days.   Complete the stretching (below) during your seated rest breaks.   *Once you are able complete 5 minutes walking/5 minutes resting with ease for 3 days, then progress your walking program as outlined below:  6 minute walk 4 minute rest (3 sets)  7 minute walk 3 minute rest (3 sets)  8 minute walk 2 minute rest (3 sets)  9 minute walk 1 minute rest (3 sets)  10 minute walk 5 minute rest (2 sets)  11 minute walk 4 minute rest (2 sets)  12 minute walk 3 minute rest (2 sets)  13 minute walk 2  Minute rest (2 sets)  14 minute walk 1 minute rest (2 sets)  30 minute walk (1 set)    Chair Sitting    Sit at edge of seat, spine straight, one leg extended. Put a hand on each thigh and bend forward from the hip, keeping spine straight. Allow hand on extended leg to reach toward toes. Support upper body with other arm. Hold _20-30__ seconds. Repeat _1__ times per session. Do _1__ sessions per rest break while you are completing your walking program.  Copyright  VHI. All rights reserved.

## 2017-01-26 NOTE — Therapy (Signed)
[facility] [address] [place], [place], [address] Phone: [phone]   Fax:  [phone]  Physical Therapy Treatment  Patient Details  Name: Marcella Dunnaway MRN: [id number] Date of Birth: 1946/01/14 Referring Provider: Alysia Penna MD   Encounter Date: 01/26/2017      PT End of Session - 01/26/17 1238    Visit Number 15   Number of Visits 18   Date for PT Re-Evaluation 02/05/17   Authorization Type Federal BCBS    PT Start Time 0800   PT Stop Time 0845   PT Time Calculation (min) 45 min   Equipment Utilized During Treatment Gait belt   Activity Tolerance Patient tolerated treatment well   Behavior During Therapy [facility] for tasks assessed/performed      Past Medical History:  Diagnosis Date  . Arthritis   . Atrial fibrillation ([place])   . Diabetes mellitus without complication ([place])   . Gout   . Hyperlipidemia   . Obesity   . Stroke [facility])     Past Surgical History:  Procedure Laterality Date  . ABDOMINAL SURGERY     colostomy & colostomy reversal  . ANKLE SURGERY    . EP IMPLANTABLE DEVICE N/A 09/02/2016   Procedure: Loop Recorder Insertion;  Surgeon: Deboraha Sprang, MD;  Location: [facility];  Service: Cardiovascular;  Laterality: N/A;  . TEE WITHOUT CARDIOVERSION N/A 09/02/2016   Procedure: TRANSESOPHAGEAL ECHOCARDIOGRAM (TEE);  Surgeon: Larey Dresser, MD;  Location: [facility];  Service: Cardiovascular;  Laterality: N/A;    There were no vitals filed for this visit.      Subjective Assessment - 01/26/17 0802    Subjective Patient denies any changes or falls. Patient reports walking in the park.    Limitations Lifting;Standing;Walking;House hold activities   Patient Stated Goals to walk better/with more ease    Currently in Pain? No/denies                         [facility] Adult PT Treatment/Exercise - 01/26/17 0800      Transfers   Transfers Sit to Stand;Stand to Sit     Sit to Stand 6: Modified independent (Device/Increase time);With upper extremity assist;From bed;From chair/3-in-1   Stand to Sit 6: Modified independent (Device/Increase time);With upper extremity assist;To bed;To chair/3-in-1     Ambulation/Gait   Ambulation/Gait Yes   Ambulation/Gait Assistance 5: Supervision   Assistive device None   Gait Pattern Step-to pattern;Decreased arm swing - right;Decreased step length - left;Decreased stance time - right;Decreased stride length;Decreased dorsiflexion - right;Decreased weight shift to right;Trendelenburg;Trunk flexed;Abducted - left;Poor foot clearance - right   Ambulation Surface Level;Unlevel;Indoor;Outdoor;Paved   Gait Comments PT introduced walking program to patient: 6 minutes walking (883f) + 4 minute rest. 5.5 minutes walking (7853f + 5 minute rest. Then 5 minutes walking (78711f+ 5 minute rest. PT introduced patient to seated stretches (see patient instructions listed, below) during rest breaks.      Walking program: Complete 5 minutes walking then 5 minutes resting. Repeat for 3 cycles. Complete walking program 5 days/week. Don't take your 2 days off each week on back-to-back days.   Complete the stretching (below) during your seated rest breaks.   *Once you are able complete 5 minutes walking/5 minutes resting with ease for 3 days, then progress your walking program as outlined below:  6 minute walk 4 minute rest (3 sets)  7 minute walk 3 minute rest (3 sets)  8 minute walk 2 minute rest (3 sets)  9 minute walk 1 minute rest (3 sets)  10 minute walk 5 minute rest (2 sets)  11 minute walk 4 minute rest (2 sets)  12 minute walk 3 minute rest (2 sets)  13 minute walk 2  Minute rest (2 sets)  14 minute walk 1 minute rest (2 sets)  30 minute walk (1 set)    Chair Sitting    Sit at edge of seat, spine straight, one leg extended. Put a hand on each thigh and bend forward from the hip, keeping spine straight. Allow hand on  extended leg to reach toward toes. Support upper body with other arm. Hold _20-30__ seconds. Repeat _1__ times per session. Do _1__ sessions per rest break while you are completing your walking program.  Copyright  VHI. All rights reserved.       Vital Signs throughout today's walking program:  After first 6 minute walking period (864f): SpO2 = 95%; HR = 100bpm  After second 5.5 minute walking period (7844f: SpO2=95%; HR = 102bpm After third 5 minute walking period (78730f SpO2 = 95%; HR= 102bpm             PT Education - 01/26/17 0805    Education provided Yes   Education Details see patient instructions listed in this note on patient's walking program    Person(s) Educated Patient   Methods Explanation   Comprehension Verbalized understanding          PT Short Term Goals - 01/11/17 1318      PT SHORT TERM GOAL #1   Title Patient will verbalize understanding of initial HEP. (TARGET DATE: 01/08/2017)   Baseline 01/07/17: pt able to verbalize HEP, however reports he has not been performing it at all   Status Partially Met     PT [place]   Title Patient will have a TUG score </=13.5 to indicate a decrease in his risk of falling. (TARGET DATE: 01/08/2017)    Baseline 01/07/17: met today with score of    Status Achieved     PT SHORT TERM GOAL #3   Title Patient will demonstrate ability to stand on LLE without UE support for >/=5 seconds to indicate a decrease in his risk of falling. (TARGET DATE: 01/08/2017)    Baseline 01/07/17: met with >/= 5 second's on LLE, ~3 seconds on RLE   Status Achieved     PT SHORT TERM GOAL #4   Title Patient will demonstrate ability to ascend/descend 4 stairs with unilateral UE support to indicate a decrease in his risk of falling when ambulating on stairs in the community. (TARGET DATE: 01/08/2017)    Baseline 01/07/17: met today   Status Achieved           PT Long Term Goals - 01/11/17 1318      PT LONG TERM GOAL #1   Title  Patient will verbalize understanding of onogoing HEP. (TARGET DATE: 02/05/2017)    Time 8   Period Weeks   Status On-going     PT LONG TERM GOAL #2   Title Patient will ambulate >/= 825f63fring 6 Minute Walk Test without an assistive device to indicate an increase in patient's endurance. (TARGET DATE: 02/05/2017)    Time 8   Period Weeks   Status On-going     PT LONG TERM GOAL #3   Title Patient will score >/= 20/30 on the FGA (without an assistive device) to indicate a  decrease in the patient's risk of falling. (TARGET DATE: 02/05/2017)    Baseline FGA = 12/30 (performed 12/10/2016)   Time 8   Period Weeks   Status On-going     PT LONG TERM GOAL #4   Title Patient will have a gait velocity of >/= 2.74f/s without an assistive device to indicate a community ambulator. (TARGET DATE: 02/05/2017)    Time 8   Period Weeks   Status On-going     PT LONG TERM GOAL #5   Title Patient will demonstrate ability to independently ambulate 500 feet outdoors (including ramps/curbs/grass) without an assistive device to indicate a decrease in his risk of falling when out in the community. (TARGET DATE: 02/05/2017)    Time 8   Period Weeks   Status On-going     PT LONG TERM GOAL #6   Title Patient will score >/= 52/56 on the Berg Balance Test to indicate a decrease in his risk of falling. (TARGET DATE: 02/05/2017)    Time 8   Period Weeks   Status On-going               Plan - 01/26/17 1243    Clinical Impression Statement Today's skilled PT session focused on creating an appropriate walking program for patient due to his reports of trying to increase his walking at the park. PT monitored patient's vital signs, and he maintained oxygen saturation >/= 95% throughout session. PT noted an increase in shuffling when fatigued, which is why PT decreased walking program from original 6 minute walk/4 minute rest to 5 minute walk/5 minute rest (3 sets) during today's session. Patient is making progress  towards goals, and will benefit from continued skilled PT to address functional mobility deficits.     Rehab Potential Good   Clinical Impairments Affecting Rehab Potential mild cognitive and memory deficits, bunion surgery, arthritis, atrial fibrillation, DM type 2, gout, obesity, colostomy & reversal, hyperlipidemia    PT Frequency 2x / week   PT Duration 8 weeks   PT Treatment/Interventions ADLs/Self Care Home Management;Electrical Stimulation;Ultrasound;Cognitive remediation;Neuromuscular re-education;Balance training;Therapeutic exercise;Therapeutic activities;Functional mobility training;Stair training;Gait training;DME Instruction;Patient/family education;Manual techniques;Passive range of motion   PT Next Visit Plan ambulation on outdoor surfaces encouraging incresaed endurance and introduce head turns as possible; incorporate attention demanding tasks as possible; advance balance activities    Consulted and Agree with Plan of Care Patient      Patient will benefit from skilled therapeutic intervention in order to improve the following deficits and impairments:  Abnormal gait, Cardiopulmonary status limiting activity, Decreased activity tolerance, Decreased balance, Decreased cognition, Decreased coordination, Decreased safety awareness, Decreased range of motion, Decreased mobility, Decreased knowledge of use of DME, Decreased endurance, Decreased strength, Difficulty walking, Impaired flexibility, Impaired sensation, Postural dysfunction, Pain  Visit Diagnosis: Muscle weakness (generalized)  Unsteadiness on feet  Other abnormalities of gait and mobility  Other lack of coordination     Problem List Patient Active Problem List   Diagnosis Date Noted  . Late effect of cerebrovascular accident (CVA) 09/30/2016  . Paroxysmal atrial fibrillation ([place] 09/23/2016  . Status post placement of implantable loop recorder   . Acute blood loss anemia   . Hypoalbuminemia due to  protein-calorie malnutrition ([place]   . Labile blood pressure   . Acute ischemic left MCA stroke ([place] 09/02/2016  . Ataxia, post-stroke   . Adjustment disorder with mixed anxiety and depressed mood   . Status post right foot surgery   . History of gout   .  Primary insomnia   . Acute ischemic stroke ([place])   . Idiopathic gout   . Morbid obesity ([place])   . Diastolic dysfunction   . Benign essential HTN   . Diabetes mellitus ([place])   . Leukocytosis   . CVA (cerebral vascular accident) ([place]) 08/30/2016  . Stroke (cerebrum) ([place]) 08/30/2016  . 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 Hammertoe of right foot 07/20/2016  . Hallux valgus, acquired, bilateral 02/19/2014  . Gout 02/13/2013  . Hyperlipidemia 01/18/2012  . Obesity 01/18/2012   Arelia Sneddon, SPT 01/26/2017, 12:44 PM  Jamey Reas, PT, DPT  01/26/2017, 1:38 PM  [facility] [address] [place], [place], [address] Phone: [phone]   Fax:  [phone]  Name: Skyelar Swigart MRN: [id number] Date of Birth: 09-01-1945

## 2017-01-26 NOTE — Progress Notes (Signed)
Subjective:    Patient ID: Aaron Moon, male    DOB: Nov 17, 1945, 71 y.o.   MRN: [id number]  [facility]  Working on walking tolerance  Modified independent with all activities and mobility. Does not use assistive device. He works in a sedentary position Herbalistsorting mail. No lifting or carrying and needed Pain Inventory Average Pain 2 Pain Right Now 2 My pain is tingling  In the last 24 hours, has pain interfered with the following? General activity 1 Relation with others 0 Enjoyment of life 0 What TIME of day is your pain at its worst? evening Sleep (in general) Fair  Pain is worse with: sitting Pain improves with: . Relief from Meds: .  Mobility walk without assistance  Function employed # of hrs/week 40  Neuro/Psych weakness numbness trouble walking  Prior Studies Any changes since last visit?  no  Physicians involved in your care Any changes since last visit?  no   Family History  Problem Relation Age of Onset  . ALS Mother   . Pancreatic cancer Father    Social History   Social History  . Marital status: Single    Spouse name: N/A  . Number of children: N/A  . Years of education: N/A   Social History Main Topics  . Smoking status: Former Games developermoker  . Smokeless tobacco: Never Used  . Alcohol use No  . Drug use: No  . Sexual activity: No   Other Topics Concern  . Not on file   Social History Narrative  . No narrative on file   Past Surgical History:  Procedure Laterality Date  . ABDOMINAL SURGERY     colostomy & colostomy reversal  . ANKLE SURGERY    . EP IMPLANTABLE DEVICE N/A 09/02/2016   Procedure: Loop Recorder Insertion;  Surgeon: Duke SalviaSteven C Klein, MD;  [facility];  Service: Cardiovascular;  Laterality: N/A;  . TEE WITHOUT CARDIOVERSION N/A 09/02/2016   Procedure: TRANSESOPHAGEAL ECHOCARDIOGRAM (TEE);  Surgeon: Laurey Moralealton S McLean, MD;  [facility];  Service: Cardiovascular;   Laterality: N/A;   Past Medical History:  Diagnosis Date  . Arthritis   . Atrial fibrillation (HCC)   . Diabetes mellitus without complication (HCC)   . Gout   . Hyperlipidemia   . Obesity   . Stroke [facility](HCC)    There were no vitals taken for this visit.  Opioid Risk Score:   Fall Risk Score:  `1  Depression screen PHQ 2/9  Depression [facility]/9 09/30/2016 07/22/2015  Decreased Interest 0 0  Down, Depressed, Hopeless 0 0  PHQ - 2 Score 0 0  Altered sleeping 0 -  Tired, decreased energy 0 -  Change in appetite 0 -  Feeling bad or failure about yourself  0 -  Trouble concentrating 0 -  Moving slowly or fidgety/restless 0 -  Suicidal thoughts 0 -  PHQ-9 Score 0 -  Difficult doing work/chores Not difficult at all -     Review of Systems  Constitutional: Negative.   HENT: Negative.   Eyes: Negative.   Respiratory: Negative.   Cardiovascular: Negative.   Gastrointestinal: Negative.   Endocrine: Negative.   Genitourinary: Negative.   Musculoskeletal: Negative.   Skin: Negative.   Allergic/Immunologic: Negative.   Neurological: Negative.   Hematological: Negative.   Psychiatric/Behavioral: Negative.   All other systems reviewed and are negative.      Objective:   Physical Exam  Constitutional:  He is oriented to person, place, and time. He appears well-developed and well-nourished.  HENT:  Head: Normocephalic and atraumatic.  Eyes: Conjunctivae and EOM are normal. Pupils are equal, round, and reactive to light.  Neurological: He is alert and oriented to person, place, and time. Gait normal.  Motor strength is 5/5 bilateral deltoid, biceps, triceps, grip, hip flexors, knee extensors and left ankle dorsiflexor  Psychiatric: He has a normal mood and affect. His behavior is normal. Judgment and thought content normal.  Nursing note and vitals reviewed.    3- RIght ankle DF     Assessment & Plan:  1. 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 He has  completed inpatient rehabilitation and is completing outpatient rehabilitation. He is made excellent progress and is at a modified independent level without assistive device. After reviewing his job duties, have written a return to work note. May start on 02/01/2017. Discussed with patient and agree with plan. Return to clinic in 6 weeks. If he has any difficulty in his return to work. He is to call this office.

## 2017-01-27 ENCOUNTER — Telehealth: Payer: Self-pay

## 2017-01-27 ENCOUNTER — Telehealth: Payer: Self-pay | Admitting: Internal Medicine

## 2017-01-27 NOTE — Telephone Encounter (Signed)
Called patient and relayed information and provided him with a phone number for his cardiologist office.

## 2017-01-27 NOTE — Telephone Encounter (Signed)
Pt needs to call cardiology for his pacemaker,  I don't see a reason for handicapped sticker based on his stroke

## 2017-01-27 NOTE — Telephone Encounter (Signed)
New Message  Pt call requesting to speak with RN about getting his remote checks time changed. Please call back to discuss

## 2017-01-27 NOTE — Telephone Encounter (Signed)
Patient called requesting information or handicapped placard for his employment, also wanting to change appointment for an appointment in reguards to his pacemaker / defibrillator to conduct a recording,   Please advise

## 2017-01-27 NOTE — Telephone Encounter (Signed)
Appt made for 6/22 @ 1200.   Patient would like time changed to 0600.

## 2017-01-28 ENCOUNTER — Encounter: Payer: Self-pay | Admitting: Physical Therapy

## 2017-01-28 ENCOUNTER — Ambulatory Visit: Payer: Federal, State, Local not specified - PPO | Admitting: Physical Therapy

## 2017-01-28 DIAGNOSIS — R2689 Other abnormalities of gait and mobility: Secondary | ICD-10-CM

## 2017-01-28 DIAGNOSIS — R2681 Unsteadiness on feet: Secondary | ICD-10-CM

## 2017-01-28 DIAGNOSIS — M6281 Muscle weakness (generalized): Secondary | ICD-10-CM | POA: Diagnosis not present

## 2017-01-28 DIAGNOSIS — R278 Other lack of coordination: Secondary | ICD-10-CM

## 2017-01-28 NOTE — Therapy (Signed)
[facility] [address] [place], [place], [address] Phone: [phone]   Fax:  [phone]  Physical Therapy Treatment  Patient Details  Name: Aaron Moon MRN: [id number] Date of Birth: 02-15-1946 Referring Provider: Alysia Penna MD   Encounter Date: 01/28/2017      PT End of Session - 01/28/17 0848    Visit Number 16   Number of Visits 18   Date for PT Re-Evaluation 02/05/17   Authorization Type Federal [employer]    PT Start Time 0802   PT Stop Time 0844   PT Time Calculation (min) 42 min   Equipment Utilized During Treatment Gait belt   Activity Tolerance Patient tolerated treatment well   Behavior During Therapy [facility] for tasks assessed/performed      Past Medical History:  Diagnosis Date  . Arthritis   . Atrial fibrillation ([place])   . Diabetes mellitus without complication ([place])   . Gout   . Hyperlipidemia   . Obesity   . Stroke [facility])     Past Surgical History:  Procedure Laterality Date  . ABDOMINAL SURGERY     colostomy & colostomy reversal  . ANKLE SURGERY    . EP IMPLANTABLE DEVICE N/A 09/02/2016   Procedure: Loop Recorder Insertion;  Surgeon: Deboraha Sprang, MD;  Location: [facility] CV LAB;  Service: Cardiovascular;  Laterality: N/A;  . TEE WITHOUT CARDIOVERSION N/A 09/02/2016   Procedure: TRANSESOPHAGEAL ECHOCARDIOGRAM (TEE);  Surgeon: Larey Dresser, MD;  Location: [place];  Service: Cardiovascular;  Laterality: N/A;    There were no vitals filed for this visit.      Subjective Assessment - 01/28/17 0803    Subjective Patient reports he started his walking program yesterday, but was only able to complete 2 sets (the goal is to complete 3 sets) due to time constraints. Patient reports he has been cleared by MD to return to work on Sunday (01/31/2017). Denies any falls.       Limitations Lifting;Standing;Walking;House hold activities   Patient Stated Goals to walk better/with more ease     Currently in Pain? No/denies                         [facility] Adult PT Treatment/Exercise - 01/28/17 0800      Transfers   Transfers Sit to Stand;Stand to Sit   Sit to Stand 6: Modified independent (Device/Increase time);With upper extremity assist;From bed;From chair/3-in-1   Stand to Sit 6: Modified independent (Device/Increase time);With upper extremity assist;To bed;To chair/3-in-1     Ambulation/Gait   Ambulation/Gait Yes   Ambulation/Gait Assistance 5: Supervision   Ambulation/Gait Assistance Details Requires cueing for foot clearance (especially in grass) and posture    Ambulation Distance (Feet) 500 Feet   Assistive device None   Gait Pattern Step-to pattern;Decreased arm swing - right;Decreased step length - left;Decreased stance time - right;Decreased stride length;Decreased dorsiflexion - right;Decreased weight shift to right;Trendelenburg;Trunk flexed;Abducted - left;Poor foot clearance - right   Ambulation Surface Level;Unlevel;Indoor;Outdoor;Paved;Grass   Gait Comments --     Neuro Re-ed    Neuro Re-ed Details  In parallel bars: performed static standing with narrow BOS on blue foam requiring intermittent tactile cueing. Progressed to head turns/nods/diagonals with EO on blue foam. Progressed to head turns/nods/diagonals + EC on blue foam requiring tactile cueing and intermittent min A due to LOB. Progressed to forward/backwards/side stepping on/off blue foam. Requires tactile cueing to maintain balance with intermittent min A due  to misstep.  Performed side stepping on blue foam with unilateral UE support and required tactile cueing to maintain balance. Progressed to light unilateral UE support + cone taps + side stepping on blue foam. Required tactile cueing with intermittent min A due to misstep. PT instructed patient in performing side stepping, backwards walking, and forward ambulation + head turns/nods. Required min guard with intermittent min A due to  misstep.                  PT Short Term Goals - 01/11/17 1318      PT SHORT TERM GOAL #1   Title Patient will verbalize understanding of initial HEP. (TARGET DATE: 01/08/2017)   Baseline 01/07/17: pt able to verbalize HEP, however reports he has not been performing it at all   Status Partially Met     PT [place] #2   Title Patient will have a TUG score </=13.5 to indicate a decrease in his risk of falling. (TARGET DATE: 01/08/2017)    Baseline 01/07/17: met today with score of    Status Achieved     PT SHORT TERM GOAL #3   Title Patient will demonstrate ability to stand on LLE without UE support for >/=5 seconds to indicate a decrease in his risk of falling. (TARGET DATE: 01/08/2017)    Baseline 01/07/17: met with >/= 5 second's on LLE, ~3 seconds on RLE   Status Achieved     PT SHORT TERM GOAL #4   Title Patient will demonstrate ability to ascend/descend 4 stairs with unilateral UE support to indicate a decrease in his risk of falling when ambulating on stairs in the community. (TARGET DATE: 01/08/2017)    Baseline 01/07/17: met today   Status Achieved           PT Long Term Goals - 01/11/17 1318      PT LONG TERM GOAL #1   Title Patient will verbalize understanding of onogoing HEP. (TARGET DATE: 02/05/2017)    Time 8   Period Weeks   Status On-going     PT LONG TERM GOAL #2   Title Patient will ambulate >/= 835f during 6 Minute Walk Test without an assistive device to indicate an increase in patient's endurance. (TARGET DATE: 02/05/2017)    Time 8   Period Weeks   Status On-going     PT LONG TERM GOAL #3   Title Patient will score >/= 20/30 on the FGA (without an assistive device) to indicate a decrease in the patient's risk of falling. (TARGET DATE: 02/05/2017)    Baseline FGA = 12/30 (performed 12/10/2016)   Time 8   Period Weeks   Status On-going     PT LONG TERM GOAL #4   Title Patient will have a gait velocity of >/= 2.662fs without an assistive device  to indicate a community ambulator. (TARGET DATE: 02/05/2017)    Time 8   Period Weeks   Status On-going     PT LONG TERM GOAL #5   Title Patient will demonstrate ability to independently ambulate 500 feet outdoors (including ramps/curbs/grass) without an assistive device to indicate a decrease in his risk of falling when out in the community. (TARGET DATE: 02/05/2017)    Time 8   Period Weeks   Status On-going     PT LONG TERM GOAL #6   Title Patient will score >/= 52/56 on the Berg Balance Test to indicate a decrease in his risk of falling. (TARGET DATE: 02/05/2017)  Time 8   Period Weeks   Status On-going               Plan - 01/28/17 4287    Clinical Impression Statement Today's skilled PT session focused on advancing patient's balance activities and gait in multiple directions and with head turns/nods. Patient required multiple seated rest breaks due to fatigue, but denied any increase in pain. Patient has been cleared by MD to return to work this weekend (Sunday, 01/31/2017). Patient is making progress towards goals, and will benefit from continued skilled PT to address functional mobility deficits.    Rehab Potential Good   Clinical Impairments Affecting Rehab Potential mild cognitive and memory deficits, bunion surgery, arthritis, atrial fibrillation, DM type 2, gout, obesity, colostomy & reversal, hyperlipidemia    PT Frequency 2x / week   PT Duration 8 weeks   PT Treatment/Interventions ADLs/Self Care Home Management;Electrical Stimulation;Ultrasound;Cognitive remediation;Neuromuscular re-education;Balance training;Therapeutic exercise;Therapeutic activities;Functional mobility training;Stair training;Gait training;DME Instruction;Patient/family education;Manual techniques;Passive range of motion   PT Next Visit Plan begin checking LTGs; attention demanding tasks + ambulation or balance activities    Consulted and Agree with Plan of Care Patient      Patient will benefit  from skilled therapeutic intervention in order to improve the following deficits and impairments:  Abnormal gait, Cardiopulmonary status limiting activity, Decreased activity tolerance, Decreased balance, Decreased cognition, Decreased coordination, Decreased safety awareness, Decreased range of motion, Decreased mobility, Decreased knowledge of use of DME, Decreased endurance, Decreased strength, Difficulty walking, Impaired flexibility, Impaired sensation, Postural dysfunction, Pain  Visit Diagnosis: Muscle weakness (generalized)  Unsteadiness on feet  Other abnormalities of gait and mobility  Other lack of coordination     Problem List Patient Active Problem List   Diagnosis Date Noted  . Late effect of cerebrovascular accident (CVA) 09/30/2016  . Paroxysmal atrial fibrillation ([place]) 09/23/2016  . Status post placement of implantable loop recorder   . Acute blood loss anemia   . Hypoalbuminemia due to protein-calorie malnutrition ([place])   . Labile blood pressure   . Acute ischemic left MCA stroke ([place]) 09/02/2016  . Ataxia, post-stroke   . Adjustment disorder with mixed anxiety and depressed mood   . Status post right foot surgery   . History of gout   . Primary insomnia   . Acute ischemic stroke ([place])   . Idiopathic gout   . Morbid obesity ([place])   . Diastolic dysfunction   . Benign essential HTN   . Diabetes mellitus ([place])   . Leukocytosis   . CVA (cerebral vascular accident) ([place]) 08/30/2016  . Stroke (cerebrum) ([place]) 08/30/2016  . 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 Hammertoe of right foot 07/20/2016  . Hallux valgus, acquired, bilateral 02/19/2014  . Gout 02/13/2013  . Hyperlipidemia 01/18/2012  . Obesity 01/18/2012    Arelia Sneddon, SPT  01/28/2017, 8:52 AM  [facility] [address] [place], [place], [address] Phone: [phone]   Fax:  [phone]  Name: Daily Crate MRN:  [id number] Date of Birth: 01-29-1946

## 2017-01-29 ENCOUNTER — Ambulatory Visit (INDEPENDENT_AMBULATORY_CARE_PROVIDER_SITE_OTHER): Payer: Self-pay | Admitting: *Deleted

## 2017-01-29 DIAGNOSIS — I639 Cerebral infarction, unspecified: Secondary | ICD-10-CM

## 2017-02-01 ENCOUNTER — Ambulatory Visit: Payer: Federal, State, Local not specified - PPO | Admitting: Physical Therapy

## 2017-02-01 ENCOUNTER — Encounter: Payer: Self-pay | Admitting: Physical Therapy

## 2017-02-01 ENCOUNTER — Ambulatory Visit (INDEPENDENT_AMBULATORY_CARE_PROVIDER_SITE_OTHER): Payer: Federal, State, Local not specified - PPO | Admitting: *Deleted

## 2017-02-01 DIAGNOSIS — M6281 Muscle weakness (generalized): Secondary | ICD-10-CM

## 2017-02-01 DIAGNOSIS — R278 Other lack of coordination: Secondary | ICD-10-CM

## 2017-02-01 DIAGNOSIS — R2681 Unsteadiness on feet: Secondary | ICD-10-CM

## 2017-02-01 DIAGNOSIS — R2689 Other abnormalities of gait and mobility: Secondary | ICD-10-CM

## 2017-02-01 DIAGNOSIS — I639 Cerebral infarction, unspecified: Secondary | ICD-10-CM

## 2017-02-01 NOTE — Therapy (Signed)
[facility] [address] [place], [place], [address] Phone: [phone]   Fax:  [phone]  Physical Therapy Treatment  Patient Details  Name: Aaron Moon MRN: [id number] Date of Birth: Nov 23, 1945 Referring Provider: Alysia Penna MD   Encounter Date: 02/01/2017      PT End of Session - 02/01/17 1138    Visit Number 17   Number of Visits 18   Date for PT Re-Evaluation 02/05/17   Authorization Type Federal BCBS    PT Start Time 0800   PT Stop Time 0845   PT Time Calculation (min) 45 min   Equipment Utilized During Treatment Gait belt   Activity Tolerance Patient tolerated treatment well   Behavior During Therapy [facility] for tasks assessed/performed      Past Medical History:  Diagnosis Date  . Arthritis   . Atrial fibrillation ([place])   . Diabetes mellitus without complication ([place])   . Gout   . Hyperlipidemia   . Obesity   . Stroke [facility])     Past Surgical History:  Procedure Laterality Date  . ABDOMINAL SURGERY     colostomy & colostomy reversal  . ANKLE SURGERY    . EP IMPLANTABLE DEVICE N/A 09/02/2016   Procedure: Loop Recorder Insertion;  Surgeon: Deboraha Sprang, MD;  Location: [place] [facility];  Service: Cardiovascular;  Laterality: N/A;  . TEE WITHOUT CARDIOVERSION N/A 09/02/2016   Procedure: TRANSESOPHAGEAL ECHOCARDIOGRAM (TEE);  Surgeon: Larey Dresser, MD;  Location: [place];  Service: Cardiovascular;  Laterality: N/A;    There were no vitals filed for this visit.      Subjective Assessment - 02/01/17 0801    Subjective Patient reports yesterday was his first day back to work, and he reports feeling fatigued when having to walk up/down isles. Patient reports having to walk periodically when at work, but the majority of his work is completed from a seated position. Patient reports he feels very tired today due to being up late with work. Denies any falls or new complaints over the  weekend. Patient reports intermittent compliance with walking program.    Limitations Lifting;Standing;Walking;House hold activities   Patient Stated Goals to walk better/with more ease    Currently in Pain? No/denies            [facility] PT Assessment - 02/01/17 0800      6 Minute Walk- Baseline   6 Minute Walk- Baseline yes   BP (mmHg) 123/89   HR (bpm) 77   Modified Borg Scale for Dyspnea 0- Nothing at all     6 Minute walk- Post Test   6 Minute Walk Post Test yes   BP (mmHg) (!)  162/95   HR (bpm) 93   Modified Borg Scale for Dyspnea 2- Mild shortness of breath     6 minute walk test results    Aerobic Endurance Distance Walked 837     Standardized Balance Assessment   Standardized Balance Assessment 10 meter walk test   10 Meter Walk 12.91s or 2.3f/s     Functional Gait  Assessment   Gait assessed  Yes   Gait Level Surface Walks 20 ft, slow speed, abnormal gait pattern, evidence for imbalance or deviates 10-15 in outside of the 12 in walkway width. Requires more than 7 sec to ambulate 20 ft.   Change in Gait Speed Able to change speed, demonstrates mild gait deviations, deviates 6-10 in outside of the 12 in walkway width, or no gait  deviations, unable to achieve a major change in velocity, or uses a change in velocity, or uses an assistive device.   Gait with Horizontal Head Turns Performs head turns smoothly with no change in gait. Deviates no more than 6 in outside 12 in walkway width   Gait with Vertical Head Turns Performs head turns with no change in gait. Deviates no more than 6 in outside 12 in walkway width.   Gait and Pivot Turn Pivot turns safely in greater than 3 sec and stops with no loss of balance, or pivot turns safely within 3 sec and stops with mild imbalance, requires small steps to catch balance.   Step Over Obstacle Is able to step over one shoe box (4.5 in total height) without changing gait speed. No evidence of imbalance.   Gait with Narrow Base of  Support Ambulates less than 4 steps heel to toe or cannot perform without assistance.   Gait with Eyes Closed Walks 20 ft, no assistive devices, good speed, no evidence of imbalance, normal gait pattern, deviates no more than 6 in outside 12 in walkway width. Ambulates 20 ft in less than 7 sec.   Ambulating Backwards Walks 20 ft, uses assistive device, slower speed, mild gait deviations, deviates 6-10 in outside 12 in walkway width.   Steps Alternating feet, must use rail.   Total Score 20                     [facility] Adult PT Treatment/Exercise - 02/01/17 0800      Transfers   Transfers Sit to Stand;Stand to Sit   Sit to Stand 6: Modified independent (Device/Increase time);With upper extremity assist;From bed;From chair/3-in-1   Stand to Sit 6: Modified independent (Device/Increase time);With upper extremity assist;To bed;To chair/3-in-1     Ambulation/Gait   Ambulation/Gait Yes   Ambulation/Gait Assistance 5: Supervision   Ambulation/Gait Assistance Details Requires cueing for step length especially when fatigued    Ambulation Distance (Feet) 837 Feet   Assistive device None   Gait Pattern Step-to pattern;Decreased arm swing - right;Decreased step length - left;Decreased stance time - right;Decreased stride length;Decreased dorsiflexion - right;Decreased weight shift to right;Trendelenburg;Trunk flexed;Abducted - left;Poor foot clearance - right   Ambulation Surface Level;Indoor     Neuro Re-ed    Neuro Re-ed Details  --                PT Education - 02/01/17 1138    Education provided Yes   Education Details findings from functional outcome measures performed during session    Person(s) Educated Patient   Methods Explanation   Comprehension Verbalized understanding          PT Short Term Goals - 01/11/17 1318      PT SHORT TERM GOAL #1   Title Patient will verbalize understanding of initial HEP. (TARGET DATE: 01/08/2017)   Baseline 01/07/17: pt able to  verbalize HEP, however reports he has not been performing it at all   Status Partially Met     PT [place] #2   Title Patient will have a TUG score </=13.5 to indicate a decrease in his risk of falling. (TARGET DATE: 01/08/2017)    Baseline 01/07/17: met today with score of    Status Achieved     PT SHORT TERM GOAL #3   Title Patient will demonstrate ability to stand on LLE without UE support for >/=5 seconds to indicate a decrease in his risk of falling. (TARGET DATE:  01/08/2017)    Baseline 01/07/17: met with >/= 5 second's on LLE, ~3 seconds on RLE   Status Achieved     PT SHORT TERM GOAL #4   Title Patient will demonstrate ability to ascend/descend 4 stairs with unilateral UE support to indicate a decrease in his risk of falling when ambulating on stairs in the community. (TARGET DATE: 01/08/2017)    Baseline 01/07/17: met today   Status Achieved           PT Long Term Goals - 02/01/17 3212      PT LONG TERM GOAL #1   Title Patient will verbalize understanding of onogoing HEP. (TARGET DATE: 02/05/2017)    Baseline --   Time 8   Period Weeks   Status On-going     PT LONG TERM GOAL #2   Title Patient will ambulate >/= 816f during 6 Minute Walk Test without an assistive device to indicate an increase in patient's endurance. (TARGET DATE: 02/05/2017)    Baseline MET 02/01/2017    Time 8   Period Weeks   Status Achieved     PT LONG TERM GOAL #3   Title Patient will score >/= 20/30 on the FGA (without an assistive device) to indicate a decrease in the patient's risk of falling. (TARGET DATE: 02/05/2017)    Baseline MET 02/01/2017 (FGA = 20/30)    Time 8   Period Weeks   Status Achieved     PT LONG TERM GOAL #4   Title Patient will have a gait velocity of >/= 2.620fs without an assistive device to indicate a community ambulator. (TARGET DATE: 02/05/2017)    Time 8   Period Weeks   Status On-going     PT LONG TERM GOAL #5   Title Patient will demonstrate ability to  independently ambulate 500 feet outdoors (including ramps/curbs/grass) without an assistive device to indicate a decrease in his risk of falling when out in the community. (TARGET DATE: 02/05/2017)    Time 8   Period Weeks   Status On-going     PT LONG TERM GOAL #6   Title Patient will score >/= 52/56 on the Berg Balance Test to indicate a decrease in his risk of falling. (TARGET DATE: 02/05/2017)    Time 8   Period Weeks   Status On-going               Plan - 02/01/17 1140    Clinical Impression Statement Today's skilled PT session focused on assessing patient's long term goals, and patient has met 2 of the long term goals that PT has assessed. PT plans to continue to assess remaining long term goals at next visit. Patient denied any pain during today's session, but did require seated rest breaks due to LE fatigue. Patient is making progress, and will benefit from continued skilled PT to address functional mobility deficits.    Rehab Potential Good   Clinical Impairments Affecting Rehab Potential mild cognitive and memory deficits, bunion surgery, arthritis, atrial fibrillation, DM type 2, gout, obesity, colostomy & reversal, hyperlipidemia    PT Frequency 2x / week   PT Duration 8 weeks   PT Treatment/Interventions ADLs/Self Care Home Management;Electrical Stimulation;Ultrasound;Cognitive remediation;Neuromuscular re-education;Balance training;Therapeutic exercise;Therapeutic activities;Functional mobility training;Stair training;Gait training;DME Instruction;Patient/family education;Manual techniques;Passive range of motion   PT Next Visit Plan assess remaining LTGs and discharge if indicated    Consulted and Agree with Plan of Care Patient      Patient will benefit from skilled therapeutic intervention in  order to improve the following deficits and impairments:  Abnormal gait, Cardiopulmonary status limiting activity, Decreased activity tolerance, Decreased balance, Decreased  cognition, Decreased coordination, Decreased safety awareness, Decreased range of motion, Decreased mobility, Decreased knowledge of use of DME, Decreased endurance, Decreased strength, Difficulty walking, Impaired flexibility, Impaired sensation, Postural dysfunction, Pain  Visit Diagnosis: Muscle weakness (generalized)  Unsteadiness on feet  Other abnormalities of gait and mobility  Other lack of coordination     Problem List Patient Active Problem List   Diagnosis Date Noted  . Late effect of cerebrovascular accident (CVA) 09/30/2016  . Paroxysmal atrial fibrillation ([place]) 09/23/2016  . Status post placement of implantable loop recorder   . Acute blood loss anemia   . Hypoalbuminemia due to protein-calorie malnutrition ([place])   . Labile blood pressure   . Acute ischemic left MCA stroke ([place]) 09/02/2016  . Ataxia, post-stroke   . Adjustment disorder with mixed anxiety and depressed mood   . Status post right foot surgery   . History of gout   . Primary insomnia   . Acute ischemic stroke ([place])   . Idiopathic gout   . Morbid obesity ([place])   . Diastolic dysfunction   . Benign essential HTN   . Diabetes mellitus ([place])   . Leukocytosis   . CVA (cerebral vascular accident) ([place]) 08/30/2016  . Stroke (cerebrum) ([place]) 08/30/2016  . 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 Hammertoe of right foot 07/20/2016  . Hallux valgus, acquired, bilateral 02/19/2014  . Gout 02/13/2013  . Hyperlipidemia 01/18/2012  . Obesity 01/18/2012    Arelia Sneddon, SPT  02/01/2017, 11:41 AM  [place] [address] [place], [place], [address] Phone: [phone]   Fax:  [phone]  Name: Aaron Moon MRN: [id number] Date of Birth: 02-25-1946

## 2017-02-01 NOTE — Progress Notes (Signed)
Carelink Summary Report / Loop Recorder 

## 2017-02-02 LAB — CUP PACEART INCLINIC DEVICE CHECK
Date Time Interrogation Session: 20180622145444
MDC IDC PG IMPLANT DT: 20180124

## 2017-02-02 NOTE — Progress Notes (Signed)
Loop check in clinic. Battery status: Good. R-waves 0.6851mV. 0 symptom episodes, (2) tachy episodes--max dur. 54sec, last 11/23/16; previously reviewed, 0 pause episodes, 0 brady episodes. 0 AF episodes (0% burden). Wireless transmission time changed to 0500 per patient request. Monthly summary reports and ROV with SK PRN.

## 2017-02-03 ENCOUNTER — Ambulatory Visit: Payer: Federal, State, Local not specified - PPO | Admitting: Physical Therapy

## 2017-02-03 ENCOUNTER — Encounter: Payer: Self-pay | Admitting: Physical Therapy

## 2017-02-03 DIAGNOSIS — M6281 Muscle weakness (generalized): Secondary | ICD-10-CM | POA: Diagnosis not present

## 2017-02-03 DIAGNOSIS — R2681 Unsteadiness on feet: Secondary | ICD-10-CM

## 2017-02-03 DIAGNOSIS — R278 Other lack of coordination: Secondary | ICD-10-CM

## 2017-02-03 DIAGNOSIS — R2689 Other abnormalities of gait and mobility: Secondary | ICD-10-CM

## 2017-02-03 NOTE — Therapy (Signed)
[facility] [address] [place], [place], [address] Phone: [phone]   Fax:  [phone]  Physical Therapy Treatment  Patient Details  Name: Aaron Moon MRN: [id number] Date of Birth: [birth year] Referring Provider: Alysia Penna MD   Encounter Date: 02/03/2017      PT End of Session - 02/03/17 0840    Visit Number 18   Number of Visits 18   Date for PT Re-Evaluation 02/05/17   Authorization Type Federal BCBS    PT Start Time 0800   PT Stop Time 0838   PT Time Calculation (min) 38 min   Activity Tolerance Patient tolerated treatment well   Behavior During Therapy [facility] for tasks assessed/performed      Past Medical History:  Diagnosis Date  . Arthritis   . Atrial fibrillation ([place])   . Diabetes mellitus without complication ([place])   . Gout   . Hyperlipidemia   . Obesity   . Stroke [facility])     Past Surgical History:  Procedure Laterality Date  . ABDOMINAL SURGERY     colostomy & colostomy reversal  . ANKLE SURGERY    . EP IMPLANTABLE DEVICE N/A 09/02/2016   Procedure: Loop Recorder Insertion;  Surgeon: Deboraha Sprang, MD;  Location: [place] [facility];  Service: Cardiovascular;  Laterality: N/A;  . TEE WITHOUT CARDIOVERSION N/A 09/02/2016   Procedure: TRANSESOPHAGEAL ECHOCARDIOGRAM (TEE);  Surgeon: Larey Dresser, MD;  Location: [place];  Service: Cardiovascular;  Laterality: N/A;    There were no vitals filed for this visit.      Subjective Assessment - 02/03/17 0802    Subjective Patient denies any changes or falls. He reports being fatigued as this is his first week back to work.    Limitations Lifting;Standing;Walking;House hold activities   Patient Stated Goals to walk better/with more ease    Currently in Pain? No/denies            [facility] PT Assessment - 02/03/17 0800      Transfers   Transfers Sit to Stand;Stand to Sit   Sit to Stand 6: Modified independent (Device/Increase  time);With upper extremity assist;From bed;From chair/3-in-1   Stand to Sit 6: Modified independent (Device/Increase time);With upper extremity assist;To bed;To chair/3-in-1     Ambulation/Gait   Ambulation/Gait Yes   Ambulation/Gait Assistance 7: Independent   Ambulation Distance (Feet) 500 Feet   Assistive device None   Gait Pattern Step-to pattern;Decreased arm swing - right;Decreased step length - left;Decreased stance time - right;Decreased stride length;Decreased dorsiflexion - right;Decreased weight shift to right;Trendelenburg;Trunk flexed;Abducted - left;Poor foot clearance - right   Ambulation Surface Level;Unlevel;Indoor;Outdoor;Paved;Gravel;Grass   Gait velocity 2.68f/s     Standardized Balance Assessment   Standardized Balance Assessment 10 meter walk test   10 Meter Walk 2.89fs     Berg Balance Test   Sit to Stand Able to stand without using hands and stabilize independently   Standing Unsupported Able to stand safely 2 minutes   Sitting with Back Unsupported but Feet Supported on Floor or Stool Able to sit safely and securely 2 minutes   Stand to Sit Sits safely with minimal use of hands   Transfers Able to transfer safely, minor use of hands   Standing Unsupported with Eyes Closed Able to stand 10 seconds safely   Standing Ubsupported with Feet Together Able to place feet together independently and stand 1 minute safely   From Standing, Reach Forward with Outstretched Arm Can reach confidently >25 cm (  10")   From Standing Position, Pick up Object from [place] to pick up shoe safely and easily   From Standing Position, Turn to Look Behind Over each Shoulder Looks behind from both sides and weight shifts well   Turn 360 Degrees Able to turn 360 degrees safely in 4 seconds or less   Standing Unsupported, Alternately Place Feet on Step/Stool Able to stand independently and complete 8 steps >20 seconds   Standing Unsupported, One Foot in Front Able to plae foot ahead of  the other independently and hold 30 seconds   Standing on One Leg Tries to lift leg/unable to hold 3 seconds but remains standing independently   Total Score 51                     [facility] Adult PT Treatment/Exercise - 02/03/17 0800      Ambulation/Gait   Gait Comments PT instructed patient to perform obstacle negotiation (stepping over object) on an outdoor, paved surface. Patient demonstrated ability to complete this task safely and independently.                PT Education - 02/03/17 [phone]    Education provided Yes   Education Details information on exercise facilities in the community; plan to discharge    Person(s) Educated Patient   Methods Explanation;Handout   Comprehension Verbalized understanding          PT Short Term Goals - 01/11/17 1318      PT SHORT TERM GOAL #1   Title Patient will verbalize understanding of initial HEP. (TARGET DATE: 01/08/2017)   Baseline 01/07/17: pt able to verbalize HEP, however reports he has not been performing it at all   Status Partially Met     PT [place] #2   Title Patient will have a TUG score </=13.5 to indicate a decrease in his risk of falling. (TARGET DATE: 01/08/2017)    Baseline 01/07/17: met today with score of    Status Achieved     PT SHORT TERM GOAL #3   Title Patient will demonstrate ability to stand on LLE without UE support for >/=5 seconds to indicate a decrease in his risk of falling. (TARGET DATE: 01/08/2017)    Baseline 01/07/17: met with >/= 5 second's on LLE, ~3 seconds on RLE   Status Achieved     PT SHORT TERM GOAL #4   Title Patient will demonstrate ability to ascend/descend 4 stairs with unilateral UE support to indicate a decrease in his risk of falling when ambulating on stairs in the community. (TARGET DATE: 01/08/2017)    Baseline 01/07/17: met today   Status Achieved           PT Long Term Goals - 02/03/17 0809      PT LONG TERM GOAL #1   Title Patient will verbalize  understanding of onogoing HEP. (TARGET DATE: 02/05/2017)    Baseline MET 02/03/2017: patient reports compliance with walking program on days he is NOT at work. He is still adjusting to working night shift, but verbalizes understanding about walking program and plans to return once he gets back into routine with being back at work.    Time 8   Period Weeks   Status Achieved     PT LONG TERM GOAL #2   Title Patient will ambulate >/= 829f during 6 Minute Walk Test without an assistive device to indicate an increase in patient's endurance. (TARGET DATE: 02/05/2017)    Baseline  MET 02/01/2017    Time 8   Period Weeks   Status Achieved     PT LONG TERM GOAL #3   Title Patient will score >/= 20/30 on the FGA (without an assistive device) to indicate a decrease in the patient's risk of falling. (TARGET DATE: 02/05/2017)    Baseline MET 02/01/2017 (FGA = 20/30)    Time 8   Period Weeks   Status Achieved     PT LONG TERM GOAL #4   Title Patient will have a gait velocity of >/= 2.74f/s without an assistive device to indicate a community ambulator. (TARGET DATE: 02/05/2017)    Baseline MET 02/03/2017: gait velocity = 2.837fs   Time 8   Period Weeks   Status Achieved     PT LONG TERM GOAL #5   Title Patient will demonstrate ability to independently ambulate 500 feet outdoors (including ramps/curbs/grass) without an assistive device to indicate a decrease in his risk of falling when out in the community. (TARGET DATE: 02/05/2017)    Baseline MET 02/03/2017   Time 8   Period Weeks   Status Achieved     PT LONG TERM GOAL #6   Title Patient will score >/= 52/56 on the Berg Balance Test to indicate a decrease in his risk of falling. (TARGET DATE: 02/05/2017)    Baseline Partially Met 02/03/2017: Patient made progress as baseline Berg Balance Test score = 46/56, and discharge Berg Balance Test = 51/56    Time 8   Period Weeks   Status Partially Met               Plan - 02/03/17 0841    Clinical  Impression Statement Today's skilled PT session focused on assessing patient's remaining long term goals and provided patient with information about community resources available to him to maintain routine exercise after discharging from PT. PT and patient discussed and agreed to discharge today due to patient's progress. He has met 5/6 long term goals, and he has partially met 1/6 long term goals. He is pleased with his progress. PT plans to discharge patient today.    Rehab Potential Good   Clinical Impairments Affecting Rehab Potential mild cognitive and memory deficits, bunion surgery, arthritis, atrial fibrillation, DM type 2, gout, obesity, colostomy & reversal, hyperlipidemia    PT Frequency 2x / week   PT Duration 8 weeks   PT Treatment/Interventions ADLs/Self Care Home Management;Electrical Stimulation;Ultrasound;Cognitive remediation;Neuromuscular re-education;Balance training;Therapeutic exercise;Therapeutic activities;Functional mobility training;Stair training;Gait training;DME Instruction;Patient/family education;Manual techniques;Passive range of motion   PT Next Visit Plan discharge today    Consulted and Agree with Plan of Care Patient      Patient will benefit from skilled therapeutic intervention in order to improve the following deficits and impairments:  Abnormal gait, Cardiopulmonary status limiting activity, Decreased activity tolerance, Decreased balance, Decreased cognition, Decreased coordination, Decreased safety awareness, Decreased range of motion, Decreased mobility, Decreased knowledge of use of DME, Decreased endurance, Decreased strength, Difficulty walking, Impaired flexibility, Impaired sensation, Postural dysfunction, Pain  Visit Diagnosis: Muscle weakness (generalized)  Unsteadiness on feet  Other abnormalities of gait and mobility  Other lack of coordination     Problem List Patient Active Problem List   Diagnosis Date Noted  . Late effect of  cerebrovascular accident (CVA) 09/30/2016  . Paroxysmal atrial fibrillation ([place]/14/2018  . Status post placement of implantable loop recorder   . Acute blood loss anemia   . Hypoalbuminemia due to protein-calorie malnutrition ([place]  . Labile blood  pressure   . Acute ischemic left MCA stroke ([place]) 09/02/2016  . Ataxia, post-stroke   . Adjustment disorder with mixed anxiety and depressed mood   . Status post right foot surgery   . History of gout   . Primary insomnia   . Acute ischemic stroke ([place])   . Idiopathic gout   . Morbid obesity ([place])   . Diastolic dysfunction   . Benign essential HTN   . Diabetes mellitus ([place])   . Leukocytosis   . CVA (cerebral vascular accident) ([place]) 08/30/2016  . Stroke (cerebrum) ([place]) 08/30/2016  . 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 Hammertoe of right foot 07/20/2016  . Hallux valgus, acquired, bilateral 02/19/2014  . Gout 02/13/2013  . Hyperlipidemia 01/18/2012  . Obesity 01/18/2012   PHYSICAL THERAPY DISCHARGE SUMMARY  Visits from Start of Care: 18  Current functional level related to goals / functional outcomes: Met all long term goals except for one long term goal, which he has partially met.   Remaining deficits: See above note.    Education / Equipment: See above note.   Plan: Patient agrees to discharge.  Patient goals were met. Patient is being discharged due to meeting the stated rehab goals.  ?????         [place], SPT  02/03/2017, 8:42 AM  [facility] [address] [place], [place], [address] Phone: [phone]   Fax:  [phone]  Name: Aaron Moon MRN: [id number] Date of Birth: 06-08-1946

## 2017-02-05 ENCOUNTER — Encounter: Payer: Self-pay | Admitting: Podiatry

## 2017-02-05 ENCOUNTER — Telehealth: Payer: Self-pay | Admitting: *Deleted

## 2017-02-05 NOTE — Telephone Encounter (Signed)
Mr Aaron Moon is asking for a return to work note that needs to be faxed to [facility] @ [phone] or [phone]  (asking for return to full capacity without restrictions). Please advise

## 2017-02-11 LAB — CUP PACEART REMOTE DEVICE CHECK
Date Time Interrogation Session: 20180624000926
Implantable Pulse Generator Implant Date: 20180124

## 2017-02-11 NOTE — Progress Notes (Signed)
Carelink summary report received. Battery status OK. Normal device function. No new symptom episodes, tachy episodes, brady, or pause episodes. No new AF episodes. Monthly summary reports and ROV/PRN 

## 2017-02-18 ENCOUNTER — Ambulatory Visit (INDEPENDENT_AMBULATORY_CARE_PROVIDER_SITE_OTHER): Payer: Federal, State, Local not specified - PPO | Admitting: Nurse Practitioner

## 2017-02-18 ENCOUNTER — Encounter: Payer: Self-pay | Admitting: Nurse Practitioner

## 2017-02-18 VITALS — BP 107/72 | HR 76 | Ht 67.0 in | Wt 244.4 lb

## 2017-02-18 DIAGNOSIS — I480 Paroxysmal atrial fibrillation: Secondary | ICD-10-CM | POA: Diagnosis not present

## 2017-02-18 DIAGNOSIS — E1159 Type 2 diabetes mellitus with other circulatory complications: Secondary | ICD-10-CM | POA: Diagnosis not present

## 2017-02-18 DIAGNOSIS — I63512 Cerebral infarction due to unspecified occlusion or stenosis of left middle cerebral artery: Secondary | ICD-10-CM

## 2017-02-18 DIAGNOSIS — E785 Hyperlipidemia, unspecified: Secondary | ICD-10-CM | POA: Diagnosis not present

## 2017-02-18 NOTE — Patient Instructions (Addendum)
Stressed the importance of management of risk factors to prevent further stroke Continue eliquis for secondary stroke prevention in atrial fibrillation Maintain strict control of hypertension with blood pressure goal below 130/90, today's reading107/.72 Control of diabetes with hemoglobin A1c below 6.5 followed by primary care most recent hemoglobin A1c7.0 continue diabetic medications Cholesterol with LDL cholesterol less than 70, followed by primary care,  continue Lipitor Exercise daily by walking,   eat healthy diet with whole grains,  fresh fruits and vegetables Will discharge from stroke clinic Stroke Prevention Some medical conditions and behaviors are associated with an increased chance of having a stroke. You may prevent a stroke by making healthy choices and managing medical conditions. How can I reduce my risk of having a stroke?  Stay physically active. Get at least 30 minutes of activity on most or all days.  Do not smoke. It may also be helpful to avoid exposure to secondhand smoke.  Limit alcohol use. Moderate alcohol use is considered to be: ? No more than 2 drinks per day for men. ? No more than 1 drink per day for nonpregnant women.  Eat healthy foods. This involves: ? Eating 5 or more servings of fruits and vegetables a day. ? Making dietary changes that address high blood pressure (hypertension), high cholesterol, diabetes, or obesity.  Manage your cholesterol levels. ? Making food choices that are high in fiber and low in saturated fat, trans fat, and cholesterol may control cholesterol levels. ? Take any prescribed medicines to control cholesterol as directed by your health care provider.  Manage your diabetes. ? Controlling your carbohydrate and sugar intake is recommended to manage diabetes. ? Take any prescribed medicines to control diabetes as directed by your health care provider.  Control your hypertension. ? Making food choices that are low in salt  (sodium), saturated fat, trans fat, and cholesterol is recommended to manage hypertension. ? Ask your health care provider if you need treatment to lower your blood pressure. Take any prescribed medicines to control hypertension as directed by your health care provider. ? If you are 6018-71 years of age, have your blood pressure checked every 3-5 years. If you are 71 years of age or older, have your blood pressure checked every year.  Maintain a healthy weight. ? Reducing calorie intake and making food choices that are low in sodium, saturated fat, trans fat, and cholesterol are recommended to manage weight.  Stop drug abuse.  Avoid taking birth control pills. ? Talk to your health care provider about the risks of taking birth control pills if you are over 71 years old, smoke, get migraines, or have ever had a blood clot.  Get evaluated for sleep disorders (sleep apnea). ? Talk to your health care provider about getting a sleep evaluation if you snore a lot or have excessive sleepiness.  Take medicines only as directed by your health care provider. ? For some people, aspirin or blood thinners (anticoagulants) are helpful in reducing the risk of forming abnormal blood clots that can lead to stroke. If you have the irregular heart rhythm of atrial fibrillation, you should be on a blood thinner unless there is a good reason you cannot take them. ? Understand all your medicine instructions.  Make sure that other conditions (such as anemia or atherosclerosis) are addressed. Get help right away if:  You have sudden weakness or numbness of the face, arm, or leg, especially on one side of the body.  Your face or eyelid droops to one  side.  You have sudden confusion.  You have trouble speaking (aphasia) or understanding.  You have sudden trouble seeing in one or both eyes.  You have sudden trouble walking.  You have dizziness.  You have a loss of balance or coordination.  You have a  sudden, severe headache with no known cause.  You have new chest pain or an irregular heartbeat. Any of these symptoms may represent a serious problem that is an emergency. Do not wait to see if the symptoms will go away. Get medical help at once. Call your local emergency services (911 in [place] Do not drive yourself to the hospital. This information is not intended to replace advice given to you by your health care provider. Make sure you discuss any questions you have with your health care provider. Document Released: 09/03/2004 Document Revised: 01/02/2016 Document Reviewed: 01/27/2013 Elsevier Interactive Patient Education  2017 [employer].

## 2017-02-18 NOTE — Progress Notes (Addendum)
[facility]  PATIENT: Aaron Moon DOB: August 04, 1946   REASON FOR VISIT: follow up for stroke event in January 2018 HISTORY FROM:patient   HISTORY OF PRESENT ILLNESS:UPDATE 07/12/2018CM  Aaron Moon, 71 year old male returns for follow-up with history of stroke January 2018.MRI scan confirmed acute patchy small to moderate left MCA territory and smaller right MCA territory infarcts.  He remains on eliquis for secondary stroke prevention and atrial fibrillation. He has not had further stroke TIA symptoms. He remains on Lipitor without complaints of myalgias.   Blood pressure in the office today 107/72.He is also diabetic but does not check his blood sugars. He is back to work full-time at [employer]. He is back to driving and his  usual activities. Returns for reevaluation.  Patient's primary care is through the [place].   HISTORY 11/17/16 PSMr Moon is a 71 year male seen today for first office f/u visit after [facility] admission for stroke in January 2018. History is obtained from the patient, review of electronic medical records and I have personally reviewed imaging films.Aaron Baldwinis a 71 y.o.malewho was in his normal state of health until 5:30 pm on the evening of admission.He was watching TV when he had sudden onset ofright hand weakness. He went to [facility] ED where a code stroke was called, and given his deficits tPA was administered prior to transfer. On arrival to [facility], he hadArm >>>leg and face weakness. LKW: 5:30 pm on 08/30/16. TPA given yes . He is admitted to the neurological intensive care unit where blood pressure was tightly controlled. He should neurological improvement. MRI scan confirmed acute patchy small to moderate left MCA territory and smaller right MCA territory infarcts. CT angiogram of the brain and neck was significant only for moderate proximal left vertebral artery stenosis which was asymptomatic. Transthoracic echo showed  normal ejection fraction. Branches of echocardiogram showed normal cardia shows of embolism. Patient underwent loop recorder implant. LDL cholesterol 91 mg percent. Hemoglobin 1107. Patient has been found to have paroxysmal atrial fibrillation and loop recorder and has been now switched to eliquis. He was also started on Lipitor. Patient states his done well since discharge. He is able to walk inside his condo with a walker but he prefers to use a wheelchair for long distances. His walking is limited by his recent bilateral foot surgery and he is wearing a boot. He is complaining of some tingling and numbness in his feet on the side of the foot surgery for last several months even prior to his strokes. He is tolerating eliquis without bleeding or bruising. He states blood pressure control is good and today it is 117/78 in office. He is tolerating Lipitor without muscle aches and pains. He states his strength is improved though he has some diminished fine motor skills.    REVIEW OF SYSTEMS: Full 14 system review of systems performed and notable only for those listed, all others are neg:  Constitutional: neg  Cardiovascular: neg Ear/Nose/Throat: neg  Skin: neg Eyes: neg Respiratory: neg Gastroitestinal: neg  Hematology/Lymphatic: neg  Endocrine: neg Musculoskeletal:neg Allergy/Immunology: neg Neurological: neg Psychiatric: neg Sleep : neg   ALLERGIES: No Known Allergies  HOME MEDICATIONS: Outpatient Medications Prior to Visit  Medication Sig Dispense Refill  . allopurinol (ZYLOPRIM) 300 MG tablet Take 300 mg by mouth daily.    Marland Kitchen apixaban (ELIQUIS) 5 MG TABS tablet Take 1 tablet (5 mg total) by mouth 2 (two) times daily. 60 tablet 0  . atorvastatin (LIPITOR) 20  MG tablet Take 1 tablet (20 mg total) by mouth daily at 6 PM. 90 tablet 0  . blood glucose meter kit and supplies KIT Dispense based on patient and insurance preference. Use up to four times daily as directed. (FOR ICD-9 250.00,  250.01). 1 each 0  . cholecalciferol (VITAMIN D) 1000 units tablet Take 1,000 Units by mouth daily.    Marland Kitchen gabapentin (NEURONTIN) 400 MG capsule at bedtime.     . hydrocerin (EUCERIN) CREA Apply 1 application topically daily. 454 g 0  . Menthol-Methyl Salicylate (MUSCLE RUB) 10-15 % CREA Apply 1 application topically 2 (two) times daily as needed for muscle pain. 113 g 0  . metFORMIN (GLUCOPHAGE) 500 MG tablet Take 1 tablet (500 mg total) by mouth daily with breakfast. 90 tablet 0  . Multiple Vitamin (MULTIVITAMIN) tablet Take 1 tablet by mouth daily.    . traZODone (DESYREL) 100 MG tablet Take 1 tablet (100 mg total) by mouth at bedtime. 30 tablet 0  . loratadine-pseudoephedrine (CLARITIN-D 24-HOUR) 10-240 MG 24 hr tablet Take 1 tablet by mouth daily as needed.     . COLCRYS 0.6 MG tablet      No facility-administered medications prior to visit.     PAST MEDICAL HISTORY: Past Medical History:  Diagnosis Date  . Arthritis   . Atrial fibrillation ([place])   . Diabetes mellitus without complication ([place])   . Gout   . Hyperlipidemia   . Obesity   . Stroke [facility])     PAST SURGICAL HISTORY: Past Surgical History:  Procedure Laterality Date  . ABDOMINAL SURGERY     colostomy & colostomy reversal  . ANKLE SURGERY    . EP IMPLANTABLE DEVICE N/A 09/02/2016   Procedure: Loop Recorder Insertion;  Surgeon: Deboraha Sprang, MD;  Location: [place] CV LAB;  Service: Cardiovascular;  Laterality: N/A;  . TEE WITHOUT CARDIOVERSION N/A 09/02/2016   Procedure: TRANSESOPHAGEAL ECHOCARDIOGRAM (TEE);  Surgeon: Larey Dresser, MD;  Location: [facility] ENDOSCOPY;  Service: Cardiovascular;  Laterality: N/A;    FAMILY HISTORY: Family History  Problem Relation Age of Onset  . ALS Mother   . Pancreatic cancer Father     SOCIAL HISTORY: Social History   Social History  . Marital status: Single    Spouse name: N/A  . Number of children: N/A  . Years of education: N/A   Occupational History  . Not on  file.   Social History Main Topics  . Smoking status: Former Research scientist (life sciences)  . Smokeless tobacco: Never Used  . Alcohol use No  . Drug use: No  . Sexual activity: No   Other Topics Concern  . Not on file   Social History Narrative  . No narrative on file     PHYSICAL EXAM  Vitals:   02/18/17 1355  BP: 107/72  Pulse: 76  Weight: 244 lb 6.4 oz (110.9 kg)  Height: '5\' 7"'  (1.702 m)   Body mass index is 38.28 kg/m.  Generalized: Well developed,  Obese male in no acute distress  Head: normocephalic and atraumatic,. Oropharynx benign  Neck: Supple, no carotid bruits  Cardiac: Regular rate rhythm, no murmur  Musculoskeletal: No deformity   Neurological examination   Mentation: Alert oriented to time, place, history taking. Attention span and concentration appropriate. Recent and remote memory intact.  Follows all commands speech and language fluent.   Cranial nerve II-XII: Pupils were equal round reactive to light extraocular movements were full, visual field were full on confrontational test. Facial  sensation and strength were normal. hearing was intact to finger rubbing bilaterally. Uvula tongue midline. head turning and shoulder shrug were normal and symmetric.Tongue protrusion into cheek strength was normal. Motor: normal bulk and tone, full strength in the BUE, BLE, fine finger movements normal, no pronator drift. No focal weakness Sensory: normal and symmetric to light touch,  Coordination: finger-nose-finger, heel-to-shin bilaterally, no dysmetria,  No tremor Reflexes: 1+ upper lower and smmetric, plantar responses were flexor bilaterally. Gait and Station: Rising up from seated position without assistance, normal stance,  moderate stride, good arm swing, smooth turning, able to perform tiptoe, and heel walking without difficulty. Tandem gait is mildly unsteady  DIAGNOSTIC DATA (LABS, IMAGING, TESTING) - I reviewed patient records, labs, notes, testing and imaging myself where  available.  Lab Results  Component Value Date   WBC 11.2 (H) 10/15/2016   HGB 12.9 (L) 10/15/2016   HCT 40.1 10/15/2016   MCV 87.9 10/15/2016   PLT 289 10/15/2016      Component Value Date/Time   NA 139 09/16/2016 0513   K 4.2 09/16/2016 0513   CL 104 09/16/2016 0513   CO2 24 09/16/2016 0513   GLUCOSE 128 (H) 09/16/2016 0513   BUN 19 09/16/2016 0513   CREATININE 1.19 09/16/2016 0513   CREATININE 1.09 07/22/2015 0909   CALCIUM 9.2 09/16/2016 0513   PROT 5.6 (L) 09/03/2016 0213   ALBUMIN 3.0 (L) 09/03/2016 0213   AST 38 09/03/2016 0213   ALT 33 09/03/2016 0213   ALKPHOS 46 09/03/2016 0213   BILITOT 0.6 09/03/2016 0213   GFRNONAA >60 09/16/2016 0513   GFRNONAA 69 07/22/2015 0909   GFRAA >60 09/16/2016 0513   GFRAA 80 07/22/2015 0909   Lab Results  Component Value Date   CHOL 179 08/31/2016   HDL 30 (L) 08/31/2016   LDLCALC 91 08/31/2016   TRIG 291 (H) 08/31/2016   CHOLHDL 6.0 08/31/2016   Lab Results  Component Value Date   HGBA1C 7.0 (H) 08/31/2016   No results found for: [id number] Lab Results  Component Value Date   TSH 3.638 07/22/2015      ASSESSMENT AND PLAN 26 year caucasian male with 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 from newly diagnosed atrial fibrillation and loop recorder Vascular risk factors of diabetes, hypertension, hyperlipidemia , atrial fibrillation and obesity. He has not had  further stroke or TIA symptoms.  Is back to work full-time. The patient is a current patient of Dr. Leonie Man  who is out of the office today . This note is sent to the work in doctor.     PLAN: Stressed the importance of management of risk factors to prevent further stroke Continue eliquis for secondary stroke prevention and  atrial fibrillation Maintain strict control of hypertension with blood pressure goal below 130/90, today's reading107/.72 Control of diabetes with hemoglobin A1c below 6.5 followed by primary care  most recent hemoglobin A1c7.0 continue diabetic medications Cholesterol with LDL cholesterol less than 70, followed by primary care,  continue Lipitor Exercise daily by walking,   eat healthy diet with whole grains,  fresh fruits and vegetables Will discharge from stroke clinic I spent 25 min  in total face to face time with the patient more than 50% of which was spent counseling and coordination of care, reviewing test results reviewing medications and discussing and reviewing the diagnosis of stroke and management of risk factors Aaron Moon, [facility], [facility], APRN  [facility] [address],  [place], [place] [address] [phone]  I reviewed the above note and documentation by the Nurse Practitioner and agree with the history, physical exam, assessment and plan as outlined above. I was immediately available for face-to-face consultation. Star Age, MD, PhD [facility])

## 2017-03-01 ENCOUNTER — Ambulatory Visit (INDEPENDENT_AMBULATORY_CARE_PROVIDER_SITE_OTHER): Payer: Federal, State, Local not specified - PPO | Admitting: *Deleted

## 2017-03-01 DIAGNOSIS — I639 Cerebral infarction, unspecified: Secondary | ICD-10-CM

## 2017-03-02 NOTE — Progress Notes (Signed)
Carelink Summary Report / Loop Recorder 

## 2017-03-09 ENCOUNTER — Encounter: Payer: Federal, State, Local not specified - PPO | Attending: Physical Medicine & Rehabilitation

## 2017-03-09 ENCOUNTER — Ambulatory Visit (HOSPITAL_BASED_OUTPATIENT_CLINIC_OR_DEPARTMENT_OTHER): Payer: Federal, State, Local not specified - PPO | Admitting: Physical Medicine & Rehabilitation

## 2017-03-09 ENCOUNTER — Encounter: Payer: Self-pay | Admitting: Physical Medicine & Rehabilitation

## 2017-03-09 VITALS — BP 122/75 | HR 76

## 2017-03-09 DIAGNOSIS — G479 Sleep disorder, unspecified: Secondary | ICD-10-CM | POA: Diagnosis not present

## 2017-03-09 DIAGNOSIS — M109 Gout, unspecified: Secondary | ICD-10-CM | POA: Insufficient documentation

## 2017-03-09 DIAGNOSIS — Z9889 Other specified postprocedural states: Secondary | ICD-10-CM | POA: Diagnosis not present

## 2017-03-09 DIAGNOSIS — I10 Essential (primary) hypertension: Secondary | ICD-10-CM | POA: Diagnosis not present

## 2017-03-09 DIAGNOSIS — I69398 Other sequelae of cerebral infarction: Secondary | ICD-10-CM | POA: Diagnosis not present

## 2017-03-09 DIAGNOSIS — R5381 Other malaise: Secondary | ICD-10-CM

## 2017-03-09 DIAGNOSIS — Z933 Colostomy status: Secondary | ICD-10-CM | POA: Insufficient documentation

## 2017-03-09 DIAGNOSIS — I6930 Unspecified sequelae of cerebral infarction: Secondary | ICD-10-CM | POA: Diagnosis present

## 2017-03-09 DIAGNOSIS — R269 Unspecified abnormalities of gait and mobility: Secondary | ICD-10-CM | POA: Diagnosis not present

## 2017-03-09 DIAGNOSIS — I69393 Ataxia following cerebral infarction: Secondary | ICD-10-CM | POA: Insufficient documentation

## 2017-03-09 DIAGNOSIS — Z800 Family history of malignant neoplasm of digestive organs: Secondary | ICD-10-CM | POA: Diagnosis not present

## 2017-03-09 DIAGNOSIS — F458 Other somatoform disorders: Secondary | ICD-10-CM

## 2017-03-09 DIAGNOSIS — Z87891 Personal history of nicotine dependence: Secondary | ICD-10-CM | POA: Insufficient documentation

## 2017-03-09 DIAGNOSIS — E785 Hyperlipidemia, unspecified: Secondary | ICD-10-CM | POA: Insufficient documentation

## 2017-03-09 DIAGNOSIS — I69392 Facial weakness following cerebral infarction: Secondary | ICD-10-CM | POA: Insufficient documentation

## 2017-03-09 DIAGNOSIS — Z820 Family history of epilepsy and other diseases of the nervous system: Secondary | ICD-10-CM | POA: Diagnosis not present

## 2017-03-09 DIAGNOSIS — E1159 Type 2 diabetes mellitus with other circulatory complications: Secondary | ICD-10-CM | POA: Insufficient documentation

## 2017-03-09 NOTE — Patient Instructions (Addendum)
Dr Santa Generaommy Jones, [address] Farris HasAaron Moon, [place]   Recommend continued physical activity

## 2017-03-09 NOTE — Progress Notes (Signed)
Subjective:    Patient ID: Aaron Moon, male    DOB: [birth year], 71 y.o.   MRN: [id number] Bilateral MCA  HPI Asking about drug interaction Did an interaction check For all of his medication and did not identify anything except that trazodone may mildly increase the efficacy of metformin and mildly reduced. The efficacy of atorvastatin Walking is still less stable than prior to CVA Right arm still a little weak Endurance is still limited Pain Inventory Average Pain 1 Pain Right Now 1 My pain is tingling  In the last 24 hours, has pain interfered with the following? General activity 1 Relation with others 1 Enjoyment of life 1 What TIME of day is your pain at its worst? night Sleep (in general) Good  Pain is worse with: sitting Pain improves with: . Relief from Meds: .  Mobility walk without assistance ability to climb steps?  no do you drive?  yes  Function employed # of hrs/week 40 Do you have any goals in this area?  no  Neuro/Psych weakness numbness trouble walking  Prior Studies Any changes since last visit?  no  Physicians involved in your care Any changes since last visit?  no   Family History  Problem Relation Age of Onset  . ALS Mother   . Pancreatic cancer Father    Social History   Social History  . Marital status: Single    Spouse name: N/A  . Number of children: N/A  . Years of education: N/A   Social History Main Topics  . Smoking status: Former Games developermoker  . Smokeless tobacco: Never Used  . Alcohol use No  . Drug use: No  . Sexual activity: No   Other Topics Concern  . Not on file   Social History Narrative  . No narrative on file   Past Surgical History:  Procedure Laterality Date  . ABDOMINAL SURGERY     colostomy & colostomy reversal  . ANKLE SURGERY    . EP IMPLANTABLE DEVICE N/A 09/02/2016   Procedure: Loop Recorder Insertion;  Surgeon: Duke SalviaSteven C Klein, MD;  [facility];  Service: Cardiovascular;   Laterality: N/A;  . TEE WITHOUT CARDIOVERSION N/A 09/02/2016   Procedure: TRANSESOPHAGEAL ECHOCARDIOGRAM (TEE);  Surgeon: Laurey Moralealton S McLean, MD;  [facility];  Service: Cardiovascular;  Laterality: N/A;   Past Medical History:  Diagnosis Date  . Arthritis   . Atrial fibrillation ([facility])   . Diabetes mellitus without complication ([facility])   . Gout   . Hyperlipidemia   . Obesity   . Stroke [facility](HCC)    There were no vitals taken for this visit.  Opioid Risk Score:   Fall Risk Score:  `1  Depression screen PHQ 2/9  Depression screen [facility] 09/30/2016 07/22/2015  Decreased Interest 0 0  Down, Depressed, Hopeless 0 0  PHQ - 2 Score 0 0  Altered sleeping 0 -  Tired, decreased energy 0 -  Change in appetite 0 -  Feeling bad or failure about yourself  0 -  Trouble concentrating 0 -  Moving slowly or fidgety/restless 0 -  Suicidal thoughts 0 -  PHQ-9 Score 0 -  Difficult doing work/chores Not difficult at all -     Review of Systems  Constitutional: Negative.   HENT: Negative.   Eyes: Negative.   Respiratory: Positive for shortness of breath.   Cardiovascular: Negative.   Gastrointestinal: Negative.   Endocrine: Negative.   Genitourinary: Negative.   Musculoskeletal: Negative.  Skin: Negative.   Allergic/Immunologic: Negative.   Neurological: Negative.   Hematological: Negative.   Psychiatric/Behavioral: Negative.   All other systems reviewed and are negative.      Objective:   Physical Exam  Constitutional: He appears well-developed.  obese  HENT:  Head: Normocephalic and atraumatic.  Nursing note and vitals reviewed.   Right finger-nose-finger with moderate dysmetria, heel-to-shin is symmetric bilaterally, left finger-nose-finger is normal. Motor strength is 5/5 bilateral deltoid, bicep, triceps, grip Gait is without evidence of toe drag or knee instability. Unable to perform tandem gait. Unable to perform heel walking and toe walking  Mood and affect are  appropriate       Assessment & Plan:  1. History of bilateral MCA infarcts. Main residual deficit is right upper extremity dysmetria, as well as a mild gait disorder and some residual endurance issues.  Continue full work activities without restrictions. Driving without restrictions.  We discussed exercise on a regular basis, this would be in addition to what he does at work. I suggested a fitness tracker to see his activity level at work.  We also discussed his residual fatigue and went over usual timeline of recovery for this.  We discussed his full-time work and that this should not cause any problems in terms of increasing stroke risk.  Return to clinic in 3 months Over half of the 25 min visit was spent counseling and coordinating care.

## 2017-03-14 LAB — CUP PACEART REMOTE DEVICE CHECK
Date Time Interrogation Session: 20180724003700
Implantable Pulse Generator Implant Date: 20180124

## 2017-03-14 NOTE — Progress Notes (Signed)
Carelink summary report received. Battery status OK. Normal device function. No new symptom episodes, tachy episodes, brady, or pause episodes. No new AF episodes. Monthly summary reports and ROV/PRN 

## 2017-03-22 ENCOUNTER — Ambulatory Visit: Payer: Federal, State, Local not specified - PPO | Admitting: Podiatry

## 2017-03-23 ENCOUNTER — Telehealth: Payer: Self-pay | Admitting: *Deleted

## 2017-03-23 NOTE — Telephone Encounter (Signed)
Aaron Moon called and asked if changing from one pair of shoes one day to another pair the next day would expedite his healing.

## 2017-03-31 ENCOUNTER — Ambulatory Visit (INDEPENDENT_AMBULATORY_CARE_PROVIDER_SITE_OTHER): Payer: Federal, State, Local not specified - PPO | Admitting: *Deleted

## 2017-03-31 DIAGNOSIS — I639 Cerebral infarction, unspecified: Secondary | ICD-10-CM

## 2017-04-01 NOTE — Progress Notes (Signed)
[facility] Summary Report / Loop Recprder  

## 2017-04-06 LAB — CUP PACEART REMOTE DEVICE CHECK
MDC IDC PG IMPLANT DT: 20180124
MDC IDC SESS DTM: 20180823004223

## 2017-04-09 IMAGING — CT CT HEAD W/O CM
3 series · 14 of 47 positions shown, 16 images · non-contrast
Comparison: Prior CT 08/30/2016 and MRI from 08/31/2016.

CLINICAL DATA: Follow-up exam status post tPA.

EXAM:
CT HEAD WITHOUT CONTRAST
TECHNIQUE: Contiguous axial images were obtained from the base of the skull
through the vertex without intravenous contrast.

[Series 2: head 5.0 h30s · axial · 0.49mm/px · z∈[-146,-6]mm · 8 of 34 slices shown, 10 images]
[im 3/34  brain]
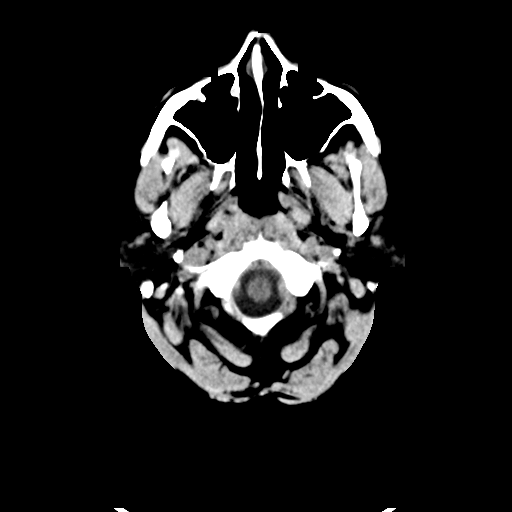
[im 3/34  bone]
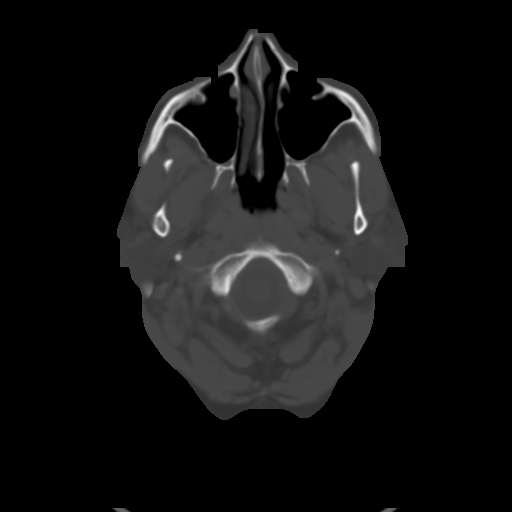
[im 7/34  brain]
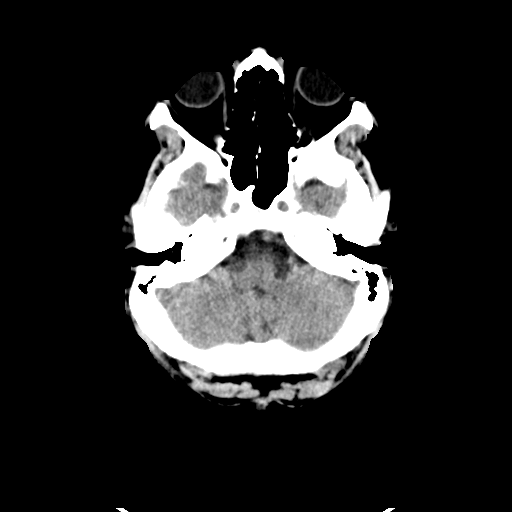
[im 11/34  brain]
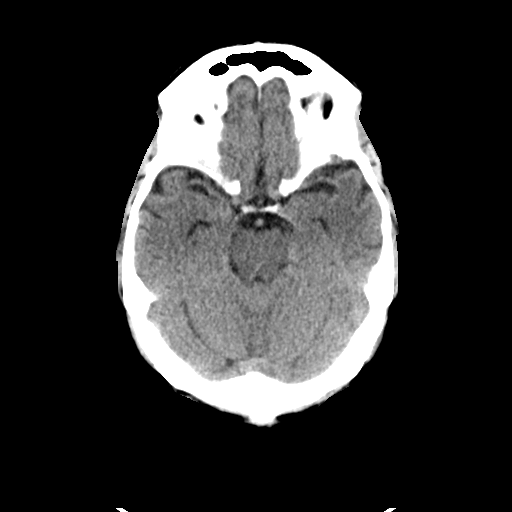
[im 15/34  brain]
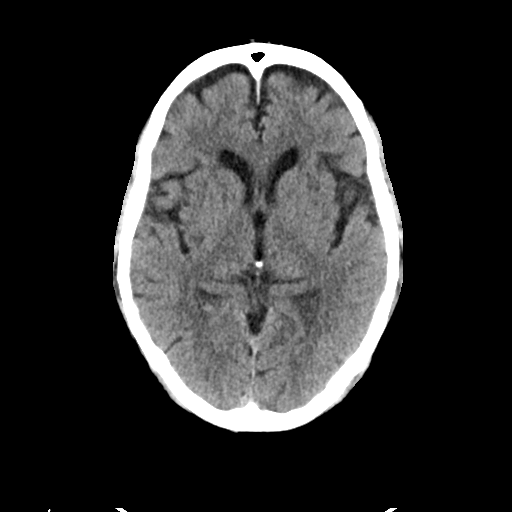
[im 19/34  brain]
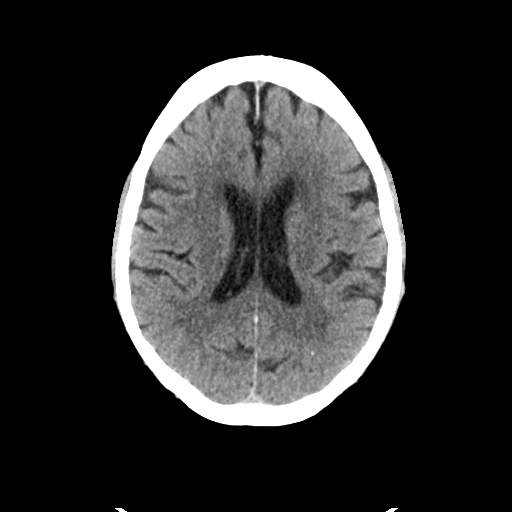
[im 19/34  bone]
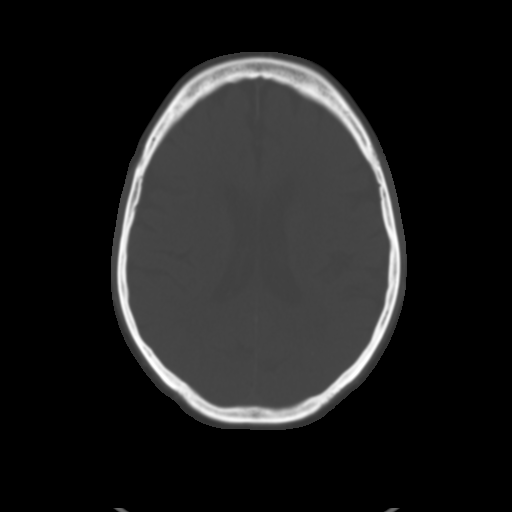
[im 23/34  brain]
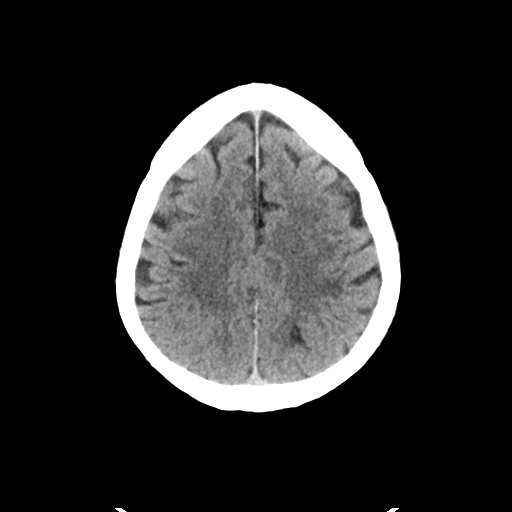
[im 27/34  brain]
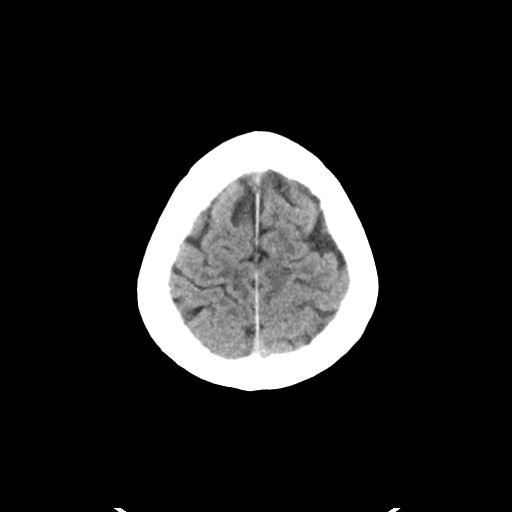
[im 31/34  brain]
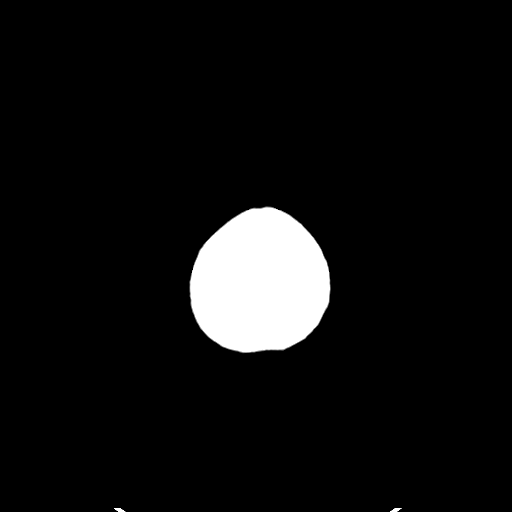

[Series 4: head 3.0 mpr cor · coronal · 0.32mm/px · 3 of 63 slices shown]
[im 21/63  brain]
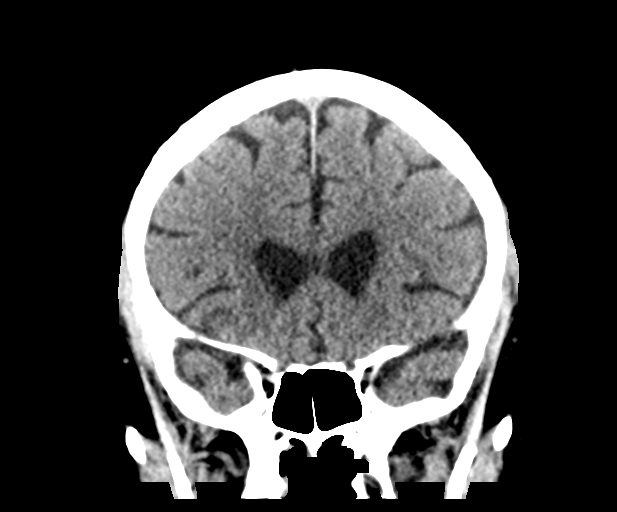
[im 28/63  brain]
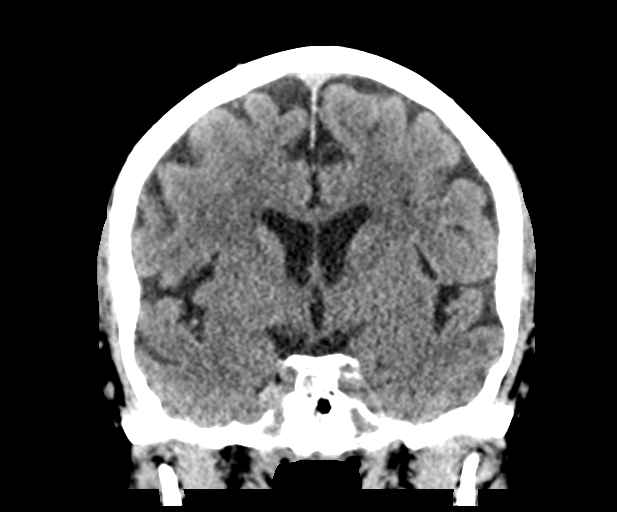
[im 35/63  brain]
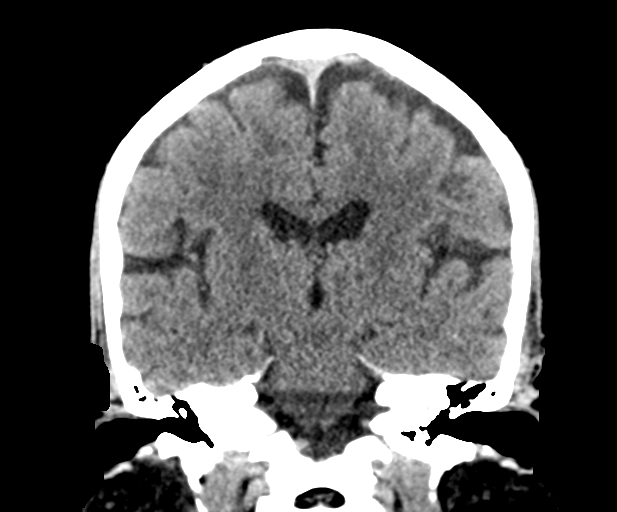

[Series 5: head 3.0 mpr sag · sagittal · 0.32mm/px · 3 of 57 slices shown]
[im 19/57  brain]
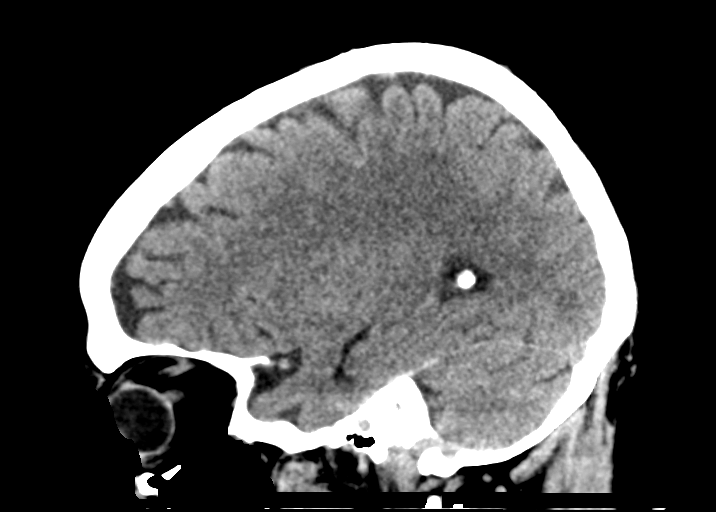
[im 29/57  brain]
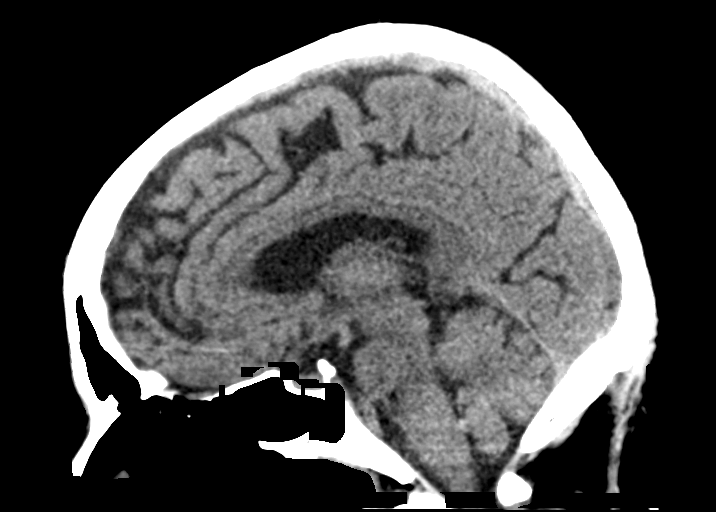
[im 38/57  brain]
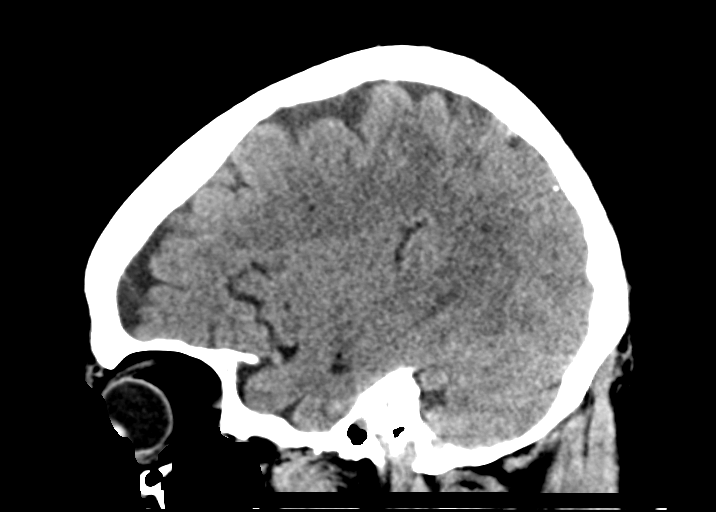

[14 of 47 positions shown; findings below may reference images not displayed]

FINDINGS: Brain: Stable atrophy with mild chronic small vessel ischemic
disease.

Evolving patchy acute bilateral MCA territory infarcts again seen,
left greater than right. Overall, distribution similar nerve
previous MRI. No significant mass effect. Few scattered subtle
minimally hyperdense areas within the area of infarction likely
reflect the previously seen petechial hemorrhage. No evidence for
frank hemorrhagic transformation or other complication.

No other acute large vessel territory infarct identified. No mass
lesion, midline shift or mass effect. No hydrocephalus. No
extra-axial fluid collection.

Vascular: Vascular calcifications noted within the carotid siphons.
No hyperdense vessel.

Skull: Scalp soft tissues demonstrate no acute abnormality.
Calvarium intact.

Sinuses/Orbits: Globes orbits within normal limits. Paranasal
sinuses are clear. No mastoid effusion.
IMPRESSION: 1. Similar size and distribution of acute patchy small to moderate
bilateral MCA territory infarcts, left greater than right. No
evidence for hemorrhagic transformation or other complication status
post tPA.
2. No other new acute intracranial process.

## 2017-04-09 IMAGING — MR MR HEAD W/O CM
9 of 10 series · 35 of 48 positions shown · non-contrast
Comparison: CTA HEAD and CTA HEAD and neck August 30, 2016

CLINICAL DATA: Acute onset RIGHT hand weakness while watching
television tonight. Status post tPA. History of hyperlipidemia.

EXAM:
MRI HEAD WITHOUT CONTRAST
TECHNIQUE: Multiplanar, multiecho pulse sequences of the brain and surrounding
structures were obtained without intravenous contrast.

[Series 6: DWI · axial · 3.0mm · 0.94mm/px · z∈[-94,+41]mm · 8 of 94 slices shown (1 of 2)]
[im 1/94]
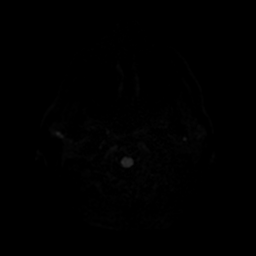
[im 11/94]
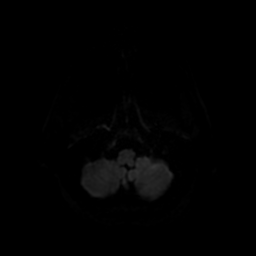
[im 32/94]
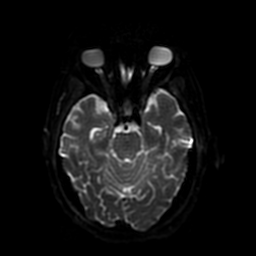
[im 42/94]
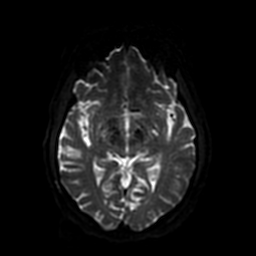
[im 52/94]
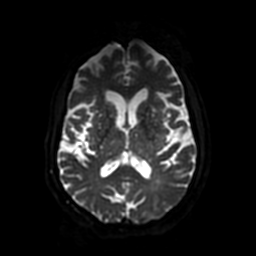
[im 63/94]
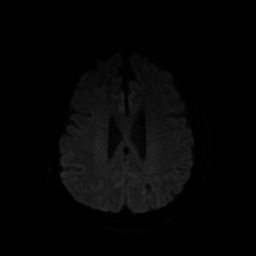
[im 83/94]
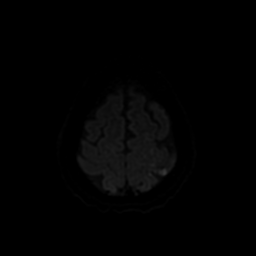
[im 94/94]
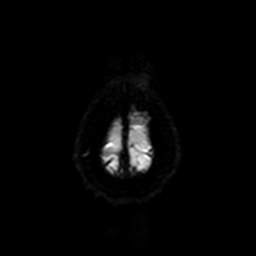

[Series 7: T2 · axial · 5.0mm · 0.47mm/px · z∈[-94,+41]mm · 2 of 24 slices shown (1 of 2)]
[im 1/24]
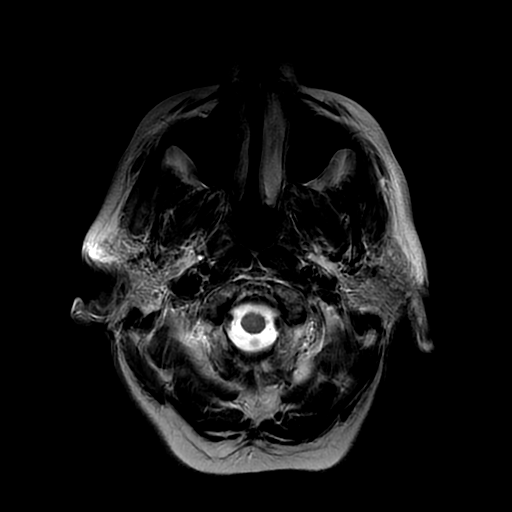
[im 24/24]
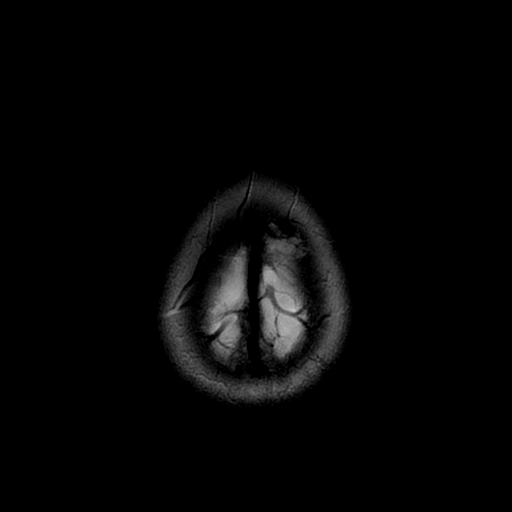

[Series 8: FLAIR · axial · 5.0mm · 0.47mm/px · z∈[-94,+41]mm · 2 of 24 slices shown (1 of 2)]
[im 1/24]
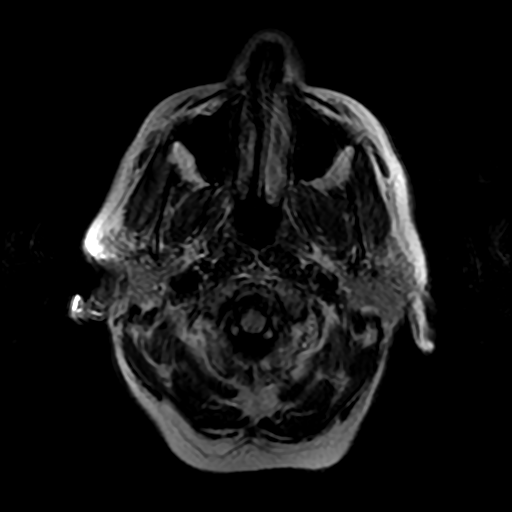
[im 24/24]
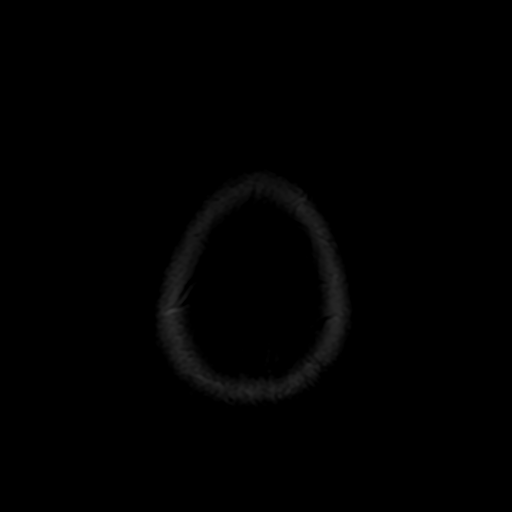

[Series 9: (person_name) · axial · 3.0mm · 0.47mm/px · z∈[-95,-61]mm · 3 of 96 slices shown]
[im 1/96]
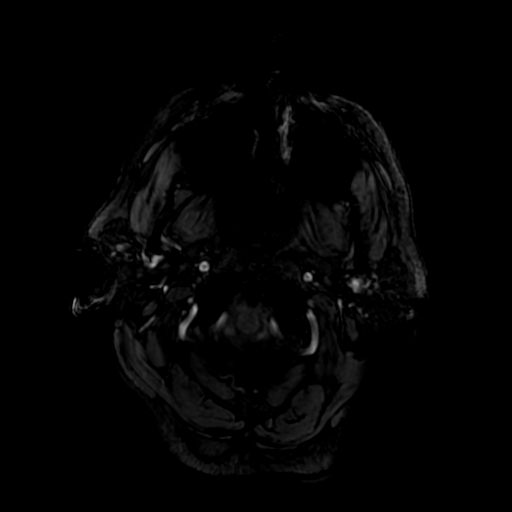
[im 12/96]
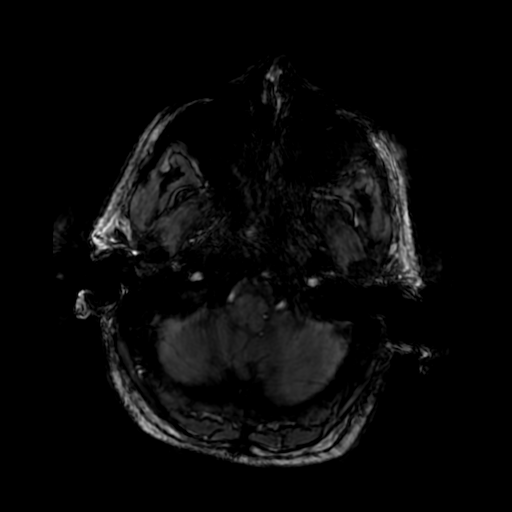
[im 24/96]
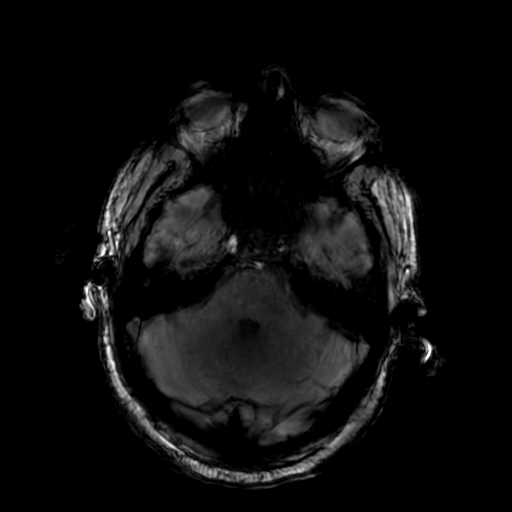

[Series 10: DWI · coronal · 4.0mm · 0.94mm/px · 7 of 68 slices shown (2 of 2)]
[im 1/68]
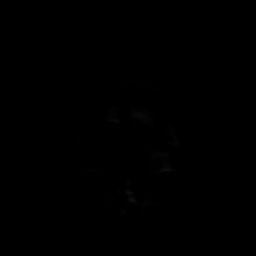
[im 12/68]
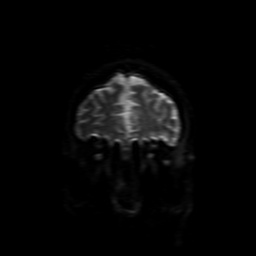
[im 23/68]
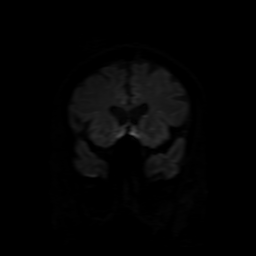
[im 34/68]
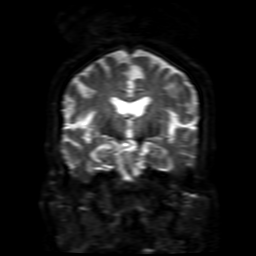
[im 45/68]
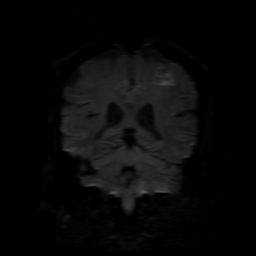
[im 56/68]
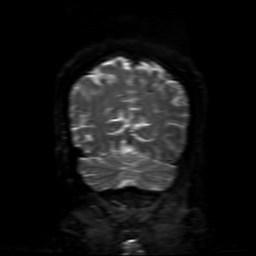
[im 68/68]
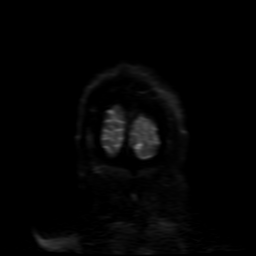

[Series 11: FLAIR · sagittal · 5.0mm · 0.47mm/px · 2 of 23 slices shown (2 of 2)]
[im 1/23]
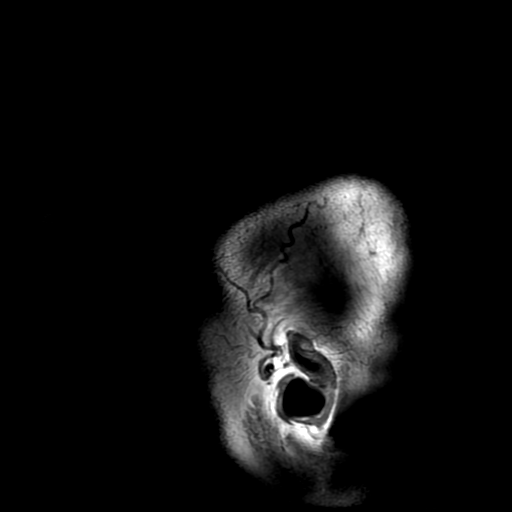
[im 23/23]
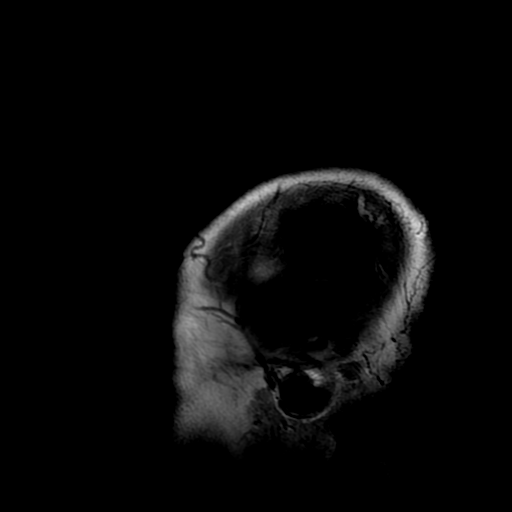

[Series 13: T2 · coronal · 5.0mm · 0.39mm/px · 3 of 28 slices shown (2 of 2)]
[im 1/28]
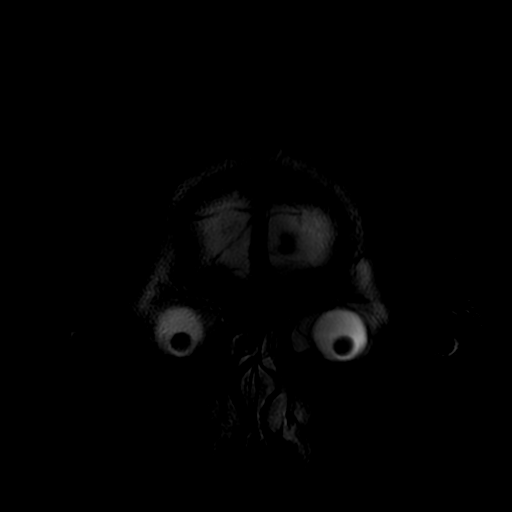
[im 14/28]
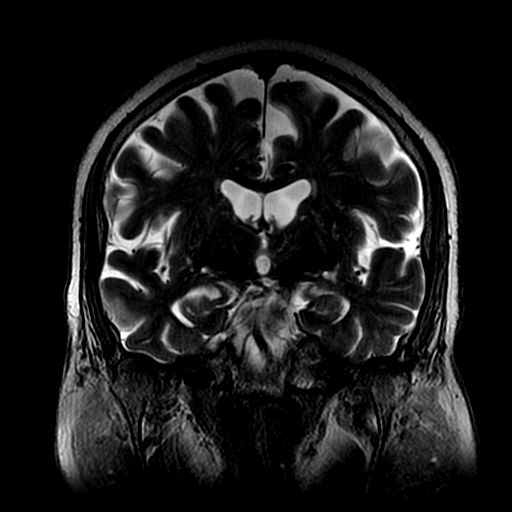
[im 28/28]
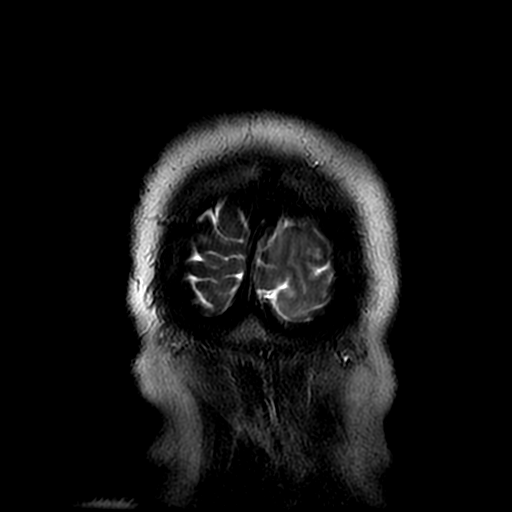

[Series 650: ADC · axial · 3.0mm · 0.94mm/px · z∈[-94,+41]mm · 5 of 47 slices shown (1 of 2)]
[im 1/47]
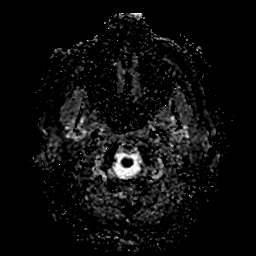
[im 12/47]
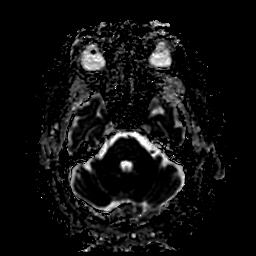
[im 24/47]
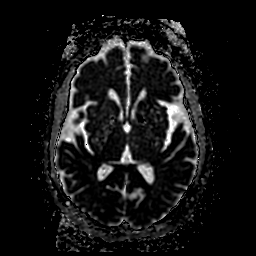
[im 35/47]
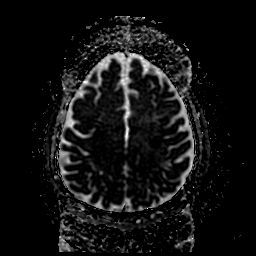
[im 47/47]
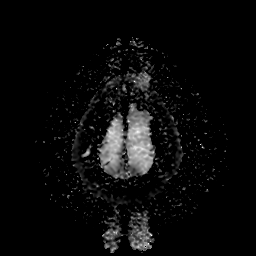

[Series 1050: ADC · coronal · 4.0mm · 0.94mm/px · 3 of 34 slices shown (2 of 2)]
[im 1/34]
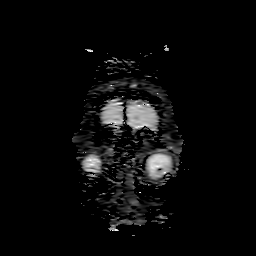
[im 17/34]
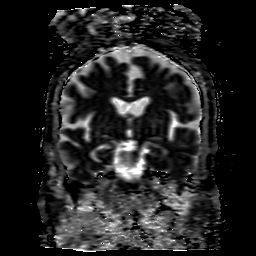
[im 34/34]
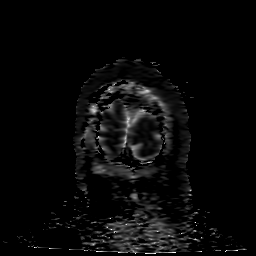

[35 of 48 positions shown; findings below may reference images not displayed]

FINDINGS: BRAIN: Patchy reduced diffusion LEFT greater than RIGHT parietal
lobes reduced diffusion. Scattered LEFT frontal and LEFT basal
ganglia foci of reduced diffusion. Corresponding low ADC values and
patchy susceptibility artifact LEFT parietal lobe. Chronic micro
hemorrhages central pons and RIGHT thalamus associated with chronic
hypertension/infarcts. Old bilateral thalamus lacunar infarcts.
Scattered subcentimeter supratentorial white matter FLAIR T2
hyperintensities exclusive of the aforementioned abnormalities. No
midline shift, mass effect or masses. Ventricles and sulci are
normal for patient's age. No abnormal extra-axial fluid collections.

VASCULAR: Normal major intracranial vascular flow voids present at
skull base.

SKULL AND UPPER CERVICAL SPINE: No abnormal sellar expansion. No
suspicious calvarial bone marrow signal. Craniocervical junction
maintained.

SINUSES/ORBITS: The mastoid air-cells and included paranasal sinuses
are well-aerated. The included ocular globes and orbital contents
are non-suspicious.

OTHER: None.
IMPRESSION: Acute patchy small to moderate LEFT MCA territory infarct with
petechial hemorrhage and small RIGHT nonhemorrhagic MCA territory
infarct. Findings concerning for central embolic etiology.

Mild chronic small vessel ischemic disease and old thalamus
infarcts.

## 2017-04-30 ENCOUNTER — Ambulatory Visit (INDEPENDENT_AMBULATORY_CARE_PROVIDER_SITE_OTHER): Payer: Federal, State, Local not specified - PPO | Admitting: *Deleted

## 2017-04-30 DIAGNOSIS — I639 Cerebral infarction, unspecified: Secondary | ICD-10-CM

## 2017-05-03 LAB — CUP PACEART REMOTE DEVICE CHECK
Implantable Pulse Generator Implant Date: 20180124
MDC IDC SESS DTM: 20180922014242

## 2017-05-03 NOTE — Progress Notes (Signed)
Carelink Summary Report / Loop Recorder 

## 2017-05-31 ENCOUNTER — Ambulatory Visit (INDEPENDENT_AMBULATORY_CARE_PROVIDER_SITE_OTHER): Payer: Federal, State, Local not specified - PPO | Admitting: *Deleted

## 2017-05-31 DIAGNOSIS — I639 Cerebral infarction, unspecified: Secondary | ICD-10-CM

## 2017-05-31 LAB — CUP PACEART REMOTE DEVICE CHECK
Date Time Interrogation Session: 20181022021108
MDC IDC PG IMPLANT DT: 20180124

## 2017-05-31 NOTE — Progress Notes (Signed)
Carelink Summary Report / Loop Recorder 

## 2017-06-29 ENCOUNTER — Ambulatory Visit (INDEPENDENT_AMBULATORY_CARE_PROVIDER_SITE_OTHER): Payer: Federal, State, Local not specified - PPO | Admitting: *Deleted

## 2017-06-29 DIAGNOSIS — I639 Cerebral infarction, unspecified: Secondary | ICD-10-CM | POA: Diagnosis not present

## 2017-06-30 NOTE — Progress Notes (Signed)
Carelink Summary Report / Loop Recorder 

## 2017-07-08 ENCOUNTER — Other Ambulatory Visit: Payer: Self-pay

## 2017-07-08 ENCOUNTER — Ambulatory Visit (INDEPENDENT_AMBULATORY_CARE_PROVIDER_SITE_OTHER): Payer: Federal, State, Local not specified - PPO | Admitting: Emergency Medicine

## 2017-07-08 ENCOUNTER — Encounter: Payer: Self-pay | Admitting: Emergency Medicine

## 2017-07-08 DIAGNOSIS — E785 Hyperlipidemia, unspecified: Secondary | ICD-10-CM | POA: Diagnosis not present

## 2017-07-08 DIAGNOSIS — I480 Paroxysmal atrial fibrillation: Secondary | ICD-10-CM

## 2017-07-08 DIAGNOSIS — K219 Gastro-esophageal reflux disease without esophagitis: Secondary | ICD-10-CM

## 2017-07-08 DIAGNOSIS — E119 Type 2 diabetes mellitus without complications: Secondary | ICD-10-CM

## 2017-07-08 DIAGNOSIS — I6300 Cerebral infarction due to thrombosis of unspecified precerebral artery: Secondary | ICD-10-CM

## 2017-07-08 DIAGNOSIS — Z0001 Encounter for general adult medical examination with abnormal findings: Secondary | ICD-10-CM

## 2017-07-08 NOTE — Assessment & Plan Note (Signed)
On Metformin 

## 2017-07-08 NOTE — Progress Notes (Signed)
Aaron Moon 71 y.o.   Chief Complaint  Patient presents with  . Annual Exam    HISTORY OF PRESENT ILLNESS: This is a 71 y.o. male Here for annual exam; no complaints; has multiple medical problems.  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 Jan 2018  Diabetes mellitus without complication ([place]) On Metformin.  Paroxysmal atrial fibrillation ([facility]) Presently NSR; on Eliquis.  Hyperlipidemia Taking Lipitor.   HPI   Prior to Admission medications   Medication Sig Start Date End Date Taking? Authorizing Provider  allopurinol (ZYLOPRIM) 300 MG tablet Take 300 mg by mouth daily. 01/01/17  Yes Borum, Jaci Standard, MD  apixaban (ELIQUIS) 5 MG TABS tablet Take 1 tablet (5 mg total) by mouth 2 (two) times daily. 09/24/16  Yes Sherran Needs, NP  atorvastatin (LIPITOR) 20 MG tablet Take 1 tablet (20 mg total) by mouth daily at 6 PM. 09/17/16  Yes Love, Ivan Anchors, PA-C  blood glucose meter kit and supplies KIT Dispense based on patient and insurance preference. Use up to four times daily as directed. (FOR ICD-9 250.00, 250.01). 09/17/16  Yes Love, Ivan Anchors, PA-C  cholecalciferol (VITAMIN D) 1000 units tablet Take 1,000 Units by mouth daily.   Yes [provider]  gabapentin (NEURONTIN) 400 MG capsule at bedtime.  12/14/16  Yes [provider]  hydrocerin (EUCERIN) CREA Apply 1 application topically daily. 09/17/16  Yes Love, Ivan Anchors, PA-C  loratadine-pseudoephedrine (CLARITIN-D 24-HOUR) 10-240 MG 24 hr tablet Take 1 tablet by mouth daily as needed.    Yes [provider]  Menthol-Methyl Salicylate (MUSCLE RUB) 10-15 % CREA Apply 1 application topically 2 (two) times daily as needed for muscle pain. 09/17/16  Yes Love, Ivan Anchors, PA-C  metFORMIN (GLUCOPHAGE) 500 MG tablet Take 1 tablet (500 mg total) by mouth daily with breakfast. 09/18/16  Yes Love, Ivan Anchors, PA-C  Multiple Vitamin (MULTIVITAMIN) tablet Take 1 tablet by mouth daily.   Yes  [provider]  traZODone (DESYREL) 100 MG tablet Take 1 tablet (100 mg total) by mouth at bedtime. 09/17/16  Yes Love, Ivan Anchors, PA-C    No Known Allergies  Patient Active Problem List   Diagnosis Date Noted  . Late effect of cerebrovascular accident (CVA) 09/30/2016  . Paroxysmal atrial fibrillation ([place]) 09/23/2016  . Status post placement of implantable loop recorder   . Acute blood loss anemia   . Hypoalbuminemia due to protein-calorie malnutrition ([place])   . Labile blood pressure   . Acute ischemic left MCA stroke ([place]) 09/02/2016  . Ataxia, post-stroke   . Adjustment disorder with mixed anxiety and depressed mood   . Status post right foot surgery   . History of gout   . Primary insomnia   . Acute ischemic stroke ([place])   . Idiopathic gout   . Morbid obesity ([place])   . Diastolic dysfunction   . Benign essential HTN   . Diabetes mellitus ([place])   . Leukocytosis   . CVA (cerebral vascular accident) ([place]) 08/30/2016  . Stroke (cerebrum) ([place]) 08/30/2016  . 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 Hammertoe of right foot 07/20/2016  . Hallux valgus, acquired, bilateral 02/19/2014  . Gout 02/13/2013  . Hyperlipidemia 01/18/2012  . Obesity 01/18/2012    Past Medical History:  Diagnosis Date  . Arthritis   . Atrial fibrillation ([place])   . Diabetes mellitus without complication ([place])   . Gout   . Hyperlipidemia   . Obesity   .  Stroke [facility])     Past Surgical History:  Procedure Laterality Date  . ABDOMINAL SURGERY     colostomy & colostomy reversal  . ANKLE SURGERY    . EP IMPLANTABLE DEVICE N/A 09/02/2016   Procedure: Loop Recorder Insertion;  Surgeon: Deboraha Sprang, MD;  Location: [facility];  Service: Cardiovascular;  Laterality: N/A;  . TEE WITHOUT CARDIOVERSION N/A 09/02/2016   Procedure: TRANSESOPHAGEAL ECHOCARDIOGRAM (TEE);  Surgeon: Larey Dresser, MD;  Location[facility] ENDOSCOPY;  Service: Cardiovascular;  Laterality: N/A;     Social History   Socioeconomic History  . Marital status: Single    Spouse name: Not on file  . Number of children: Not on file  . Years of education: Not on file  . Highest education level: Not on file  Social Needs  . Financial resource strain: Not on file  . Food insecurity - worry: Not on file  . Food insecurity - inability: Not on file  . Transportation needs - medical: Not on file  . Transportation needs - non-medical: Not on file  Occupational History  . Not on file  Tobacco Use  . Smoking status: Former Research scientist (life sciences)  . Smokeless tobacco: Never Used  Substance and Sexual Activity  . Alcohol use: No  . Drug use: No  . Sexual activity: No  Other Topics Concern  . Not on file  Social History Narrative  . Not on file    Family History  Problem Relation Age of Onset  . ALS Mother   . Pancreatic cancer Father      Review of Systems  Constitutional: Negative.  Negative for chills and fever.  HENT: Negative.  Negative for congestion, hearing loss, nosebleeds and sore throat.   Eyes: Negative.  Negative for blurred vision and double vision.  Respiratory: Negative.  Negative for cough and shortness of breath.   Cardiovascular: Negative.  Negative for chest pain and palpitations.  Gastrointestinal: Negative.  Negative for abdominal pain, diarrhea, nausea and vomiting.  Genitourinary: Negative.  Negative for dysuria and hematuria.  Musculoskeletal: Negative.  Negative for myalgias and neck pain.  Skin: Negative.  Negative for rash.  Neurological: Negative.  Negative for dizziness, sensory change, focal weakness and headaches.  Endo/Heme/Allergies: Negative.   All other systems reviewed and are negative.  Vitals:   07/08/17 1350  BP: 112/62  Pulse: 66  Resp: 16  Temp: (!) 97.5 F (36.4 C)  SpO2: 96%     Physical Exam  Constitutional: He is oriented to person, place, and time. He appears well-developed.  Obese  HENT:  Head: Normocephalic and atraumatic.   Nose: Nose normal.  Mouth/Throat: Oropharynx is clear and moist.  Eyes: Conjunctivae and EOM are normal. Pupils are equal, round, and reactive to light.  Neck: Normal range of motion. Neck supple. No JVD present.  Cardiovascular: Normal rate, regular rhythm and normal heart sounds.  Pulmonary/Chest: Effort normal and breath sounds normal.  Abdominal: Soft. Bowel sounds are normal. He exhibits no distension. There is no tenderness.  Musculoskeletal: Normal range of motion.  Lymphadenopathy:    He has no cervical adenopathy.  Neurological: He is alert and oriented to person, place, and time. No sensory deficit. He exhibits normal muscle tone.  Skin: Skin is warm and dry. Capillary refill takes less than 2 seconds. No rash noted.  Psychiatric: He has a normal mood and affect. His behavior is normal.  Vitals reviewed.    ASSESSMENT & PLAN: Aaron Moon was seen today for annual exam.  Diagnoses and all orders for this visit:  Morbid obesity, unspecified obesity type ([place])  Cerebrovascular accident (CVA) due to thrombosis of precerebral artery ([place]) -     Ambulatory referral to Neurology  Type 2 diabetes mellitus without complication, without long-term current use of insulin ([place])  Gastroesophageal reflux disease without esophagitis  Hyperlipidemia, unspecified hyperlipidemia type  Paroxysmal atrial fibrillation ([place])  Encounter for general adult medical examination with abnormal findings -     EKG 12-Lead -     CBC with Differential -     Comprehensive metabolic panel -     Hemoglobin A1c -     Lipid panel -     Hepatitis C antibody screen    Patient Instructions       IF you received an x-ray today, you will receive an invoice from [facility]. Please contact [facility] at [phone] with questions or concerns regarding your invoice.   IF you received labwork today, you will receive an invoice from [facility]. Please contact [facility] at [phone] with  questions or concerns regarding your invoice.   Our billing staff will not be able to assist you with questions regarding bills from these companies.  You will be contacted with the lab results as soon as they are available. The fastest way to get your results is to activate your My Chart account. Instructions are located on the last page of this paperwork. If you have not heard from [place] regarding the results in 2 weeks, please contact this office.       Health Maintenance, Male A healthy lifestyle and preventive care is important for your health and wellness. Ask your health care provider about what schedule of regular examinations is right for you. What should I know about weight and diet? Eat a Healthy Diet  Eat plenty of vegetables, fruits, whole grains, low-fat dairy products, and lean protein.  Do not eat a lot of foods high in solid fats, added sugars, or salt.  Maintain a Healthy Weight Regular exercise can help you achieve or maintain a healthy weight. You should:  Do at least 150 minutes of exercise each week. The exercise should increase your heart rate and make you sweat (moderate-intensity exercise).  Do strength-training exercises at least twice a week.  Watch Your Levels of Cholesterol and Blood Lipids  Have your blood tested for lipids and cholesterol every 5 years starting at 71 years of age. If you are at high risk for heart disease, you should start having your blood tested when you are 71 years old. You may need to have your cholesterol levels checked more often if: ? Your lipid or cholesterol levels are high. ? You are older than 71 years of age. ? You are at high risk for heart disease.  What should I know about cancer screening? Many types of cancers can be detected early and may often be prevented. Lung Cancer  You should be screened every year for lung cancer if: ? You are a current smoker who has smoked for at least 30 years. ? You are a former smoker  who has quit within the past 15 years.  Talk to your health care provider about your screening options, when you should start screening, and how often you should be screened.  Colorectal Cancer  Routine colorectal cancer screening usually begins at 71 years of age and should be repeated every 5-10 years until you are 71 years old. You may need to be screened more often if early  forms of precancerous polyps or small growths are found. Your health care provider may recommend screening at an earlier age if you have risk factors for colon cancer.  Your health care provider may recommend using home test kits to check for hidden blood in the stool.  A small camera at the end of a tube can be used to examine your colon (sigmoidoscopy or colonoscopy). This checks for the earliest forms of colorectal cancer.  Prostate and Testicular Cancer  Depending on your age and overall health, your health care provider may do certain tests to screen for prostate and testicular cancer.  Talk to your health care provider about any symptoms or concerns you have about testicular or prostate cancer.  Skin Cancer  Check your skin from head to toe regularly.  Tell your health care provider about any new moles or changes in moles, especially if: ? There is a change in a mole's size, shape, or color. ? You have a mole that is larger than a pencil eraser.  Always use sunscreen. Apply sunscreen liberally and repeat throughout the day.  Protect yourself by wearing long sleeves, pants, a wide-brimmed hat, and sunglasses when outside.  What should I know about heart disease, diabetes, and high blood pressure?  If you are 78-37 years of age, have your blood pressure checked every 3-5 years. If you are 64 years of age or older, have your blood pressure checked every year. You should have your blood pressure measured twice-once when you are at a hospital or clinic, and once when you are not at a hospital or clinic. Record  the average of the two measurements. To check your blood pressure when you are not at a hospital or clinic, you can use: ? An automated blood pressure machine at a pharmacy. ? A home blood pressure monitor.  Talk to your health care provider about your target blood pressure.  If you are between 34-5 years old, ask your health care provider if you should take aspirin to prevent heart disease.  Have regular diabetes screenings by checking your fasting blood sugar level. ? If you are at a normal weight and have a low risk for diabetes, have this test once every three years after the age of 79. ? If you are overweight and have a high risk for diabetes, consider being tested at a younger age or more often.  A one-time screening for abdominal aortic aneurysm (AAA) by ultrasound is recommended for men aged 27-75 years who are current or former smokers. What should I know about preventing infection? Hepatitis B If you have a higher risk for hepatitis B, you should be screened for this virus. Talk with your health care provider to find out if you are at risk for hepatitis B infection. Hepatitis C Blood testing is recommended for:  Everyone born from [birth year] through 1965.  Anyone with known risk factors for hepatitis C.  Sexually Transmitted Diseases (STDs)  You should be screened each year for STDs including gonorrhea and chlamydia if: ? You are sexually active and are younger than 71 years of age. ? You are older than 71 years of age and your health care provider tells you that you are at risk for this type of infection. ? Your sexual activity has changed since you were last screened and you are at an increased risk for chlamydia or gonorrhea. Ask your health care provider if you are at risk.  Talk with your health care provider about whether you are  at high risk of being infected with HIV. Your health care provider may recommend a prescription medicine to help prevent HIV infection.  What else  can I do?  Schedule regular health, dental, and eye exams.  Stay current with your vaccines (immunizations).  Do not use any tobacco products, such as cigarettes, chewing tobacco, and e-cigarettes. If you need help quitting, ask your health care provider.  Limit alcohol intake to no more than 2 drinks per day. One drink equals 12 ounces of beer, 5 ounces of wine, or 1 ounces of hard liquor.  Do not use street drugs.  Do not share needles.  Ask your health care provider for help if you need support or information about quitting drugs.  Tell your health care provider if you often feel depressed.  Tell your health care provider if you have ever been abused or do not feel safe at home. This information is not intended to replace advice given to you by your health care provider. Make sure you discuss any questions you have with your health care provider. Document Released: 01/23/2008 Document Revised: 03/25/2016 Document Reviewed: 04/30/2015 Elsevier Interactive Patient Education  2018 Elsevier Inc.      Agustina Caroli, MD [facility]

## 2017-07-08 NOTE — Assessment & Plan Note (Signed)
Presently NSR; on Eliquis.

## 2017-07-08 NOTE — Assessment & Plan Note (Signed)
Doing well; right arm weakness recovered fully.

## 2017-07-08 NOTE — Patient Instructions (Addendum)
   IF you received an x-ray today, you will receive an invoice from [facility]. Please contact  Radiology at [phone] with questions or concerns regarding your invoice.   IF you received labwork today, you will receive an invoice from [facility]. Please contact [facility] at [phone] with questions or concerns regarding your invoice.   Our billing staff will not be able to assist you with questions regarding bills from these companies.  You will be contacted with the lab results as soon as they are available. The fastest way to get your results is to activate your My Chart account. Instructions are located on the last page of this paperwork. If you have not heard from us regarding the results in 2 weeks, please contact this office.      Health Maintenance, Male A healthy lifestyle and preventive care is important for your health and wellness. Ask your health care provider about what schedule of regular examinations is right for you. What should I know about weight and diet? Eat a Healthy Diet  Eat plenty of vegetables, fruits, whole grains, low-fat dairy products, and lean protein.  Do not eat a lot of foods high in solid fats, added sugars, or salt.  Maintain a Healthy Weight Regular exercise can help you achieve or maintain a healthy weight. You should:  Do at least 150 minutes of exercise each week. The exercise should increase your heart rate and make you sweat (moderate-intensity exercise).  Do strength-training exercises at least twice a week.  Watch Your Levels of Cholesterol and Blood Lipids  Have your blood tested for lipids and cholesterol every 5 years starting at 71 years of age. If you are at high risk for heart disease, you should start having your blood tested when you are 71 years old. You may need to have your cholesterol levels checked more often if: ? Your lipid or cholesterol levels are high. ? You are older than 71 years of age. ? You  are at high risk for heart disease.  What should I know about cancer screening? Many types of cancers can be detected early and may often be prevented. Lung Cancer  You should be screened every year for lung cancer if: ? You are a current smoker who has smoked for at least 30 years. ? You are a former smoker who has quit within the past 15 years.  Talk to your health care provider about your screening options, when you should start screening, and how often you should be screened.  Colorectal Cancer  Routine colorectal cancer screening usually begins at 71 years of age and should be repeated every 5-10 years until you are 71 years old. You may need to be screened more often if early forms of precancerous polyps or small growths are found. Your health care provider may recommend screening at an earlier age if you have risk factors for colon cancer.  Your health care provider may recommend using home test kits to check for hidden blood in the stool.  A small camera at the end of a tube can be used to examine your colon (sigmoidoscopy or colonoscopy). This checks for the earliest forms of colorectal cancer.  Prostate and Testicular Cancer  Depending on your age and overall health, your health care provider may do certain tests to screen for prostate and testicular cancer.  Talk to your health care provider about any symptoms or concerns you have about testicular or prostate cancer.  Skin Cancer  Check your skin   from head to toe regularly.  Tell your health care provider about any new moles or changes in moles, especially if: ? There is a change in a mole's size, shape, or color. ? You have a mole that is larger than a pencil eraser.  Always use sunscreen. Apply sunscreen liberally and repeat throughout the day.  Protect yourself by wearing long sleeves, pants, a wide-brimmed hat, and sunglasses when outside.  What should I know about heart disease, diabetes, and high blood  pressure?  If you are 18-39 years of age, have your blood pressure checked every 3-5 years. If you are 40 years of age or older, have your blood pressure checked every year. You should have your blood pressure measured twice-once when you are at a hospital or clinic, and once when you are not at a hospital or clinic. Record the average of the two measurements. To check your blood pressure when you are not at a hospital or clinic, you can use: ? An automated blood pressure machine at a pharmacy. ? A home blood pressure monitor.  Talk to your health care provider about your target blood pressure.  If you are between 45-79 years old, ask your health care provider if you should take aspirin to prevent heart disease.  Have regular diabetes screenings by checking your fasting blood sugar level. ? If you are at a normal weight and have a low risk for diabetes, have this test once every three years after the age of 45. ? If you are overweight and have a high risk for diabetes, consider being tested at a younger age or more often.  A one-time screening for abdominal aortic aneurysm (AAA) by ultrasound is recommended for men aged 65-75 years who are current or former smokers. What should I know about preventing infection? Hepatitis B If you have a higher risk for hepatitis B, you should be screened for this virus. Talk with your health care provider to find out if you are at risk for hepatitis B infection. Hepatitis C Blood testing is recommended for:  Everyone born from 1945 through 1965.  Anyone with known risk factors for hepatitis C.  Sexually Transmitted Diseases (STDs)  You should be screened each year for STDs including gonorrhea and chlamydia if: ? You are sexually active and are younger than 71 years of age. ? You are older than 71 years of age and your health care provider tells you that you are at risk for this type of infection. ? Your sexual activity has changed since you were last  screened and you are at an increased risk for chlamydia or gonorrhea. Ask your health care provider if you are at risk.  Talk with your health care provider about whether you are at high risk of being infected with HIV. Your health care provider may recommend a prescription medicine to help prevent HIV infection.  What else can I do?  Schedule regular health, dental, and eye exams.  Stay current with your vaccines (immunizations).  Do not use any tobacco products, such as cigarettes, chewing tobacco, and e-cigarettes. If you need help quitting, ask your health care provider.  Limit alcohol intake to no more than 2 drinks per day. One drink equals 12 ounces of beer, 5 ounces of wine, or 1 ounces of hard liquor.  Do not use street drugs.  Do not share needles.  Ask your health care provider for help if you need support or information about quitting drugs.  Tell your health care   provider if you often feel depressed.  Tell your health care provider if you have ever been abused or do not feel safe at home. This information is not intended to replace advice given to you by your health care provider. Make sure you discuss any questions you have with your health care provider. Document Released: 01/23/2008 Document Revised: 03/25/2016 Document Reviewed: 04/30/2015 Elsevier Interactive Patient Education  2018 Elsevier Inc.  

## 2017-07-08 NOTE — Assessment & Plan Note (Signed)
Taking Lipitor

## 2017-07-09 ENCOUNTER — Encounter: Payer: Self-pay | Admitting: Radiology

## 2017-07-09 LAB — LIPID PANEL
CHOLESTEROL TOTAL: 118 mg/dL (ref 100–199)
Chol/HDL Ratio: 3.4 ratio (ref 0.0–5.0)
HDL: 35 mg/dL — ABNORMAL LOW (ref >39)
LDL CALC: 55 mg/dL (ref 0–99)
TRIGLYCERIDES: 138 mg/dL (ref 0–149)
VLDL Cholesterol Cal: 28 mg/dL (ref 5–40)

## 2017-07-09 LAB — CBC WITH DIFFERENTIAL/PLATELET
Basophils Absolute: 0.1 10*3/uL (ref 0.0–0.2)
Basos: 1 %
EOS (ABSOLUTE): 0.1 10*3/uL (ref 0.0–0.4)
EOS: 2 %
HEMATOCRIT: 42.7 % (ref 37.5–51.0)
HEMOGLOBIN: 14.1 g/dL (ref 13.0–17.7)
Immature Grans (Abs): 0 10*3/uL (ref 0.0–0.1)
Immature Granulocytes: 0 %
LYMPHS ABS: 2.1 10*3/uL (ref 0.7–3.1)
Lymphs: 27 %
MCH: 29.4 pg (ref 26.6–33.0)
MCHC: 33 g/dL (ref 31.5–35.7)
MCV: 89 fL (ref 79–97)
MONOCYTES: 8 %
MONOS ABS: 0.6 10*3/uL (ref 0.1–0.9)
NEUTROS ABS: 5 10*3/uL (ref 1.4–7.0)
Neutrophils: 62 %
Platelets: 250 10*3/uL (ref 150–379)
RBC: 4.8 x10E6/uL (ref 4.14–5.80)
RDW: 15.2 % (ref 12.3–15.4)
WBC: 7.9 10*3/uL (ref 3.4–10.8)

## 2017-07-09 LAB — COMPREHENSIVE METABOLIC PANEL
A/G RATIO: 1.9 (ref 1.2–2.2)
ALK PHOS: 70 IU/L (ref 39–117)
ALT: 23 IU/L (ref 0–44)
AST: 23 IU/L (ref 0–40)
Albumin: 4.2 g/dL (ref 3.5–4.8)
BILIRUBIN TOTAL: 0.3 mg/dL (ref 0.0–1.2)
BUN/Creatinine Ratio: 12 (ref 10–24)
BUN: 13 mg/dL (ref 8–27)
CO2: 21 mmol/L (ref 20–29)
Calcium: 9.2 mg/dL (ref 8.6–10.2)
Chloride: 106 mmol/L (ref 96–106)
Creatinine, Ser: 1.07 mg/dL (ref 0.76–1.27)
GFR calc non Af Amer: 69 mL/min/{1.73_m2} (ref >59)
GFR, EST AFRICAN AMERICAN: 80 mL/min/{1.73_m2} (ref >59)
GLUCOSE: 129 mg/dL — AB (ref 65–99)
Globulin, Total: 2.2 g/dL (ref 1.5–4.5)
POTASSIUM: 4.7 mmol/L (ref 3.5–5.2)
Sodium: 143 mmol/L (ref 134–144)
TOTAL PROTEIN: 6.4 g/dL (ref 6.0–8.5)

## 2017-07-09 LAB — HEPATITIS C ANTIBODY: Hep C Virus Ab: <0.1 s/co ratio (ref 0.0–0.9)

## 2017-07-09 LAB — HEMOGLOBIN A1C
Est. average glucose Bld gHb Est-mCnc: 146 mg/dL
HEMOGLOBIN A1C: 6.7 % — AB (ref 4.8–5.6)

## 2017-07-15 LAB — CUP PACEART REMOTE DEVICE CHECK
Date Time Interrogation Session: 20181121024008
MDC IDC PG IMPLANT DT: 20180124

## 2017-07-29 ENCOUNTER — Ambulatory Visit (INDEPENDENT_AMBULATORY_CARE_PROVIDER_SITE_OTHER): Payer: Federal, State, Local not specified - PPO | Admitting: *Deleted

## 2017-07-29 DIAGNOSIS — I639 Cerebral infarction, unspecified: Secondary | ICD-10-CM | POA: Diagnosis not present

## 2017-07-30 NOTE — Progress Notes (Signed)
Carelink Summary Report / Loop Recorder 

## 2017-08-12 LAB — CUP PACEART REMOTE DEVICE CHECK
Date Time Interrogation Session: 20181221031007
MDC IDC PG IMPLANT DT: 20180124

## 2017-08-28 ENCOUNTER — Ambulatory Visit: Payer: Federal, State, Local not specified - PPO | Admitting: Family Medicine

## 2017-08-28 ENCOUNTER — Other Ambulatory Visit: Payer: Self-pay

## 2017-08-28 ENCOUNTER — Encounter: Payer: Self-pay | Admitting: Family Medicine

## 2017-08-28 VITALS — BP 142/91 | HR 93 | Temp 99.8°F | Resp 16 | Ht 67.0 in | Wt 246.6 lb

## 2017-08-28 DIAGNOSIS — I480 Paroxysmal atrial fibrillation: Secondary | ICD-10-CM | POA: Diagnosis not present

## 2017-08-28 DIAGNOSIS — E119 Type 2 diabetes mellitus without complications: Secondary | ICD-10-CM | POA: Diagnosis not present

## 2017-08-28 DIAGNOSIS — R509 Fever, unspecified: Secondary | ICD-10-CM | POA: Diagnosis not present

## 2017-08-28 DIAGNOSIS — I63413 Cerebral infarction due to embolism of bilateral middle cerebral arteries: Secondary | ICD-10-CM

## 2017-08-28 LAB — POCT INFLUENZA A/B
Influenza A, POC: POSITIVE — AB
Influenza B, POC: NEGATIVE

## 2017-08-28 MED ORDER — OSELTAMIVIR PHOSPHATE 75 MG PO CAPS: 75.0000 mg | ORAL_CAPSULE | Freq: Two times a day (BID) | ORAL | 0 refills | Status: AC

## 2017-08-28 NOTE — Progress Notes (Signed)
Chief Complaint  Patient presents with  . flu like symptoms    x yesterday, bodyaches, fever, congestion, chills, no energy.  Pt received flu shot this year    HPI   Pt reports bodyaches, tactile fever, congestion, chills, fatigue Poor appetite He states that he got sick 24 hours ago He has taken mucinex cold and flu He also tried some throat numbing spray He had a flu shot in the fall  He has a history of a. Fib, diabetes and a history of stroke He took his meds but only had a protein bar to eat He is drinking some water His urine is yellow    Lab Results  Component Value Date   HGBA1C 6.7 (H) 07/08/2017    Past Medical History:  Diagnosis Date  . Arthritis   . Atrial fibrillation ([place])   . Diabetes mellitus without complication ([place])   . Gout   . Hyperlipidemia   . Obesity   . Stroke [facility])     Current Outpatient Medications  Medication Sig Dispense Refill  . allopurinol (ZYLOPRIM) 300 MG tablet Take 300 mg by mouth daily.    Marland Kitchen apixaban (ELIQUIS) 5 MG TABS tablet Take 1 tablet (5 mg total) by mouth 2 (two) times daily. 60 tablet 0  . atorvastatin (LIPITOR) 20 MG tablet Take 1 tablet (20 mg total) by mouth daily at 6 PM. 90 tablet 0  . blood glucose meter kit and supplies KIT Dispense based on patient and insurance preference. Use up to four times daily as directed. (FOR ICD-9 250.00, 250.01). 1 each 0  . cholecalciferol (VITAMIN D) 1000 units tablet Take 1,000 Units by mouth daily.    Marland Kitchen gabapentin (NEURONTIN) 400 MG capsule at bedtime.     . hydrocerin (EUCERIN) CREA Apply 1 application topically daily. 454 g 0  . loratadine-pseudoephedrine (CLARITIN-D 24-HOUR) 10-240 MG 24 hr tablet Take 1 tablet by mouth daily as needed.     . metFORMIN (GLUCOPHAGE) 500 MG tablet Take 1 tablet (500 mg total) by mouth daily with breakfast. 90 tablet 0  . Multiple Vitamin (MULTIVITAMIN) tablet Take 1 tablet by mouth daily.    Marland Kitchen oseltamivir (TAMIFLU) 75 MG capsule Take 1 capsule  (75 mg total) by mouth 2 (two) times daily for 5 days. 10 capsule 0  . traZODone (DESYREL) 100 MG tablet Take 1 tablet (100 mg total) by mouth at bedtime. 30 tablet 0   No current facility-administered medications for this visit.     Allergies: No Known Allergies  Past Surgical History:  Procedure Laterality Date  . ABDOMINAL SURGERY     colostomy & colostomy reversal  . ANKLE SURGERY    . EP IMPLANTABLE DEVICE N/A 09/02/2016   Procedure: Loop Recorder Insertion;  Surgeon: Deboraha Sprang, MD;  Location: [facility];  Service: Cardiovascular;  Laterality: N/A;  . TEE WITHOUT CARDIOVERSION N/A 09/02/2016   Procedure: TRANSESOPHAGEAL ECHOCARDIOGRAM (TEE);  Surgeon: Larey Dresser, MD;  Location: [facility] END[facility]COPY;  Service: Cardiovascular;  Laterality: N/A;    Social History   Socioeconomic History  . Marital status: Single    Spouse name: None  . Number of children: None  . Years of education: None  . Highest education level: None  Social Needs  . Financial resource strain: None  . Food insecurity - worry: None  . Food insecurity - inability: None  . Transportation needs - medical: None  . Transportation needs - non-medical: None  Occupational History  . None  Tobacco Use  . Smoking status: Former Research scientist (life sciences)  . Smokeless tobacco: Never Used  Substance and Sexual Activity  . Alcohol use: No  . Drug use: No  . Sexual activity: No  Other Topics Concern  . None  Social History Narrative  . None    Family History  Problem Relation Age of Onset  . ALS Mother   . Pancreatic cancer Father      ROS Review of Systems See HPI Constitution: + fevers or chills No malaise No diaphoresis Skin: No rash or itching Eyes: no blurry vision, no double vision GU: no dysuria or hematuria Neuro: no dizziness or headaches  all others reviewed and negative   Objective: Vitals:   08/28/17 1546  BP: (!) 142/91  Pulse: 93  Resp: 16  Temp: 99.8 F (37.7 C)  TempSrc: Oral    SpO2: 94%  Weight: 246 lb 9.6 oz (111.9 kg)  Height: _0  (1.702 m)    Physical Exam General: alert, oriented, in NAD Head: normocephalic, atraumatic, no sinus tenderness Eyes: EOM intact, no scleral icterus or conjunctival injection Ears: TM clear bilaterally Nose: mucosa nonerythematous, nonedematous Throat: no pharyngeal exudate or erythema Lymph: no posterior auricular, submental or cervical lymph adenopathy Heart: normal rate, normal sinus rhythm, no murmurs Lungs: clear to auscultation bilaterally, no wheezing   Assessment and Plan Teancum was seen today for flu like symptoms.  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     [place]

## 2017-08-28 NOTE — Patient Instructions (Addendum)
   IF you received an x-ray today, you will receive an invoice from [facility]. Please contact [facility] at [phone] with questions or concerns regarding your invoice.   IF you received labwork today, you will receive an invoice from [facility]. Please contact [facility] at [phone] with questions or concerns regarding your invoice.   Our billing staff will not be able to assist you with questions regarding bills from these companies.  You will be contacted with the lab results as soon as they are available. The fastest way to get your results is to activate your My Chart account. Instructions are located on the last page of this paperwork. If you have not heard from us regarding the results in 2 weeks, please contact this office.      Influenza, Adult Influenza, more commonly known as "the flu," is a viral infection that primarily affects the respiratory tract. The respiratory tract includes organs that help you breathe, such as the lungs, nose, and throat. The flu causes many common cold symptoms, as well as a high fever and body aches. The flu spreads easily from person to person (is contagious). Getting a flu shot (influenza vaccination) every year is the best way to prevent influenza. What are the causes? Influenza is caused by a virus. You can catch the virus by:  Breathing in droplets from an infected person's cough or sneeze.  Touching something that was recently contaminated with the virus and then touching your mouth, nose, or eyes.  What increases the risk? The following factors may make you more likely to get the flu:  Not cleaning your hands frequently with soap and water or alcohol-based hand sanitizer.  Having close contact with many people during cold and flu season.  Touching your mouth, eyes, or nose without washing or sanitizing your hands first.  Not drinking enough fluids or not eating a healthy diet.  Not getting enough sleep or  exercise.  Being under a high amount of stress.  Not getting a yearly (annual) flu shot.  You may be at a higher risk of complications from the flu, such as a severe lung infection (pneumonia), if you:  Are over the age of 65.  Are pregnant.  Have a weakened disease-fighting system (immune system). You may have a weakened immune system if you: ? Have HIV or AIDS. ? Are undergoing chemotherapy. ? Aretaking medicines that reduce the activity of (suppress) the immune system.  Have a long-term (chronic) illness, such as heart disease, kidney disease, diabetes, or lung disease.  Have a liver disorder.  Are obese.  Have anemia.  What are the signs or symptoms? Symptoms of this condition typically last 4-10 days and may include:  Fever.  Chills.  Headache, body aches, or muscle aches.  Sore throat.  Cough.  Runny or congested nose.  Chest discomfort and cough.  Poor appetite.  Weakness or tiredness (fatigue).  Dizziness.  Nausea or vomiting.  How is this diagnosed? This condition may be diagnosed based on your medical history and a physical exam. Your health care provider may do a nose or throat swab test to confirm the diagnosis. How is this treated? If influenza is detected early, you can be treated with antiviral medicine that can reduce the length of your illness and the severity of your symptoms. This medicine may be given by mouth (orally) or through an IV tube that is inserted in one of your veins. The goal of treatment is to relieve symptoms by taking   care of yourself at home. This may include taking over-the-counter medicines, drinking plenty of fluids, and adding humidity to the air in your home. In some cases, influenza goes away on its own. Severe influenza or complications from influenza may be treated in a hospital. Follow these instructions at home:  Take over-the-counter and prescription medicines only as told by your health care provider.  Use a  cool mist humidifier to add humidity to the air in your home. This can make breathing easier.  Rest as needed.  Drink enough fluid to keep your urine clear or pale yellow.  Cover your mouth and nose when you cough or sneeze.  Wash your hands with soap and water often, especially after you cough or sneeze. If soap and water are not available, use hand sanitizer.  Stay home from work or school as told by your health care provider. Unless you are visiting your health care provider, try to avoid leaving home until your fever has been gone for 24 hours without the use of medicine.  Keep all follow-up visits as told by your health care provider. This is important. How is this prevented?  Getting an annual flu shot is the best way to avoid getting the flu. You may get the flu shot in late summer, fall, or winter. Ask your health care provider when you should get your flu shot.  Wash your hands often or use hand sanitizer often.  Avoid contact with people who are sick during cold and flu season.  Eat a healthy diet, drink plenty of fluids, get enough sleep, and exercise regularly. Contact a health care provider if:  You develop new symptoms.  You have: ? Chest pain. ? Diarrhea. ? A fever.  Your cough gets worse.  You produce more mucus.  You feel nauseous or you vomit. Get help right away if:  You develop shortness of breath or difficulty breathing.  Your skin or nails turn a bluish color.  You have severe pain or stiffness in your neck.  You develop a sudden headache or sudden pain in your face or ear.  You cannot stop vomiting. This information is not intended to replace advice given to you by your health care provider. Make sure you discuss any questions you have with your health care provider. Document Released: 07/24/2000 Document Revised: 01/02/2016 Document Reviewed: 05/21/2015 Elsevier Interactive Patient Education  2017 [employer] Inc.  

## 2017-08-30 ENCOUNTER — Ambulatory Visit (INDEPENDENT_AMBULATORY_CARE_PROVIDER_SITE_OTHER): Payer: Federal, State, Local not specified - PPO | Admitting: *Deleted

## 2017-08-30 DIAGNOSIS — I639 Cerebral infarction, unspecified: Secondary | ICD-10-CM

## 2017-08-30 NOTE — Progress Notes (Signed)
Carelink Summary Report / Loop Recorder 

## 2017-09-06 LAB — CUP PACEART REMOTE DEVICE CHECK
Implantable Pulse Generator Implant Date: 20180124
MDC IDC SESS DTM: 20190120033855

## 2017-09-27 ENCOUNTER — Ambulatory Visit (INDEPENDENT_AMBULATORY_CARE_PROVIDER_SITE_OTHER): Payer: Federal, State, Local not specified - PPO | Admitting: *Deleted

## 2017-09-27 DIAGNOSIS — I639 Cerebral infarction, unspecified: Secondary | ICD-10-CM

## 2017-09-28 NOTE — Progress Notes (Addendum)
Carelink Summary Report / Loop Recorder 

## 2017-10-25 LAB — CUP PACEART REMOTE DEVICE CHECK
Implantable Pulse Generator Implant Date: 20180124
MDC IDC SESS DTM: 20190220034750

## 2017-11-01 ENCOUNTER — Ambulatory Visit (INDEPENDENT_AMBULATORY_CARE_PROVIDER_SITE_OTHER): Payer: Federal, State, Local not specified - PPO | Admitting: *Deleted

## 2017-11-01 DIAGNOSIS — I639 Cerebral infarction, unspecified: Secondary | ICD-10-CM | POA: Diagnosis not present

## 2017-11-01 NOTE — Progress Notes (Signed)
Carelink Summary Report / Loop Recorder 

## 2017-12-06 ENCOUNTER — Ambulatory Visit (INDEPENDENT_AMBULATORY_CARE_PROVIDER_SITE_OTHER): Payer: Federal, State, Local not specified - PPO | Admitting: *Deleted

## 2017-12-06 DIAGNOSIS — I639 Cerebral infarction, unspecified: Secondary | ICD-10-CM | POA: Diagnosis not present

## 2017-12-06 NOTE — Progress Notes (Signed)
Carelink Summary Report / Loop Recorder 

## 2017-12-11 LAB — CUP PACEART REMOTE DEVICE CHECK
Date Time Interrogation Session: 20190325084026
MDC IDC PG IMPLANT DT: 20180124

## 2017-12-29 LAB — CUP PACEART REMOTE DEVICE CHECK
Implantable Pulse Generator Implant Date: 20180124
MDC IDC SESS DTM: 20190427083622

## 2018-01-06 ENCOUNTER — Ambulatory Visit (INDEPENDENT_AMBULATORY_CARE_PROVIDER_SITE_OTHER): Payer: Federal, State, Local not specified - PPO | Admitting: *Deleted

## 2018-01-06 DIAGNOSIS — I639 Cerebral infarction, unspecified: Secondary | ICD-10-CM | POA: Diagnosis not present

## 2018-01-06 NOTE — Progress Notes (Signed)
Carelink Summary Report / Loop Recorder 

## 2018-01-31 LAB — CUP PACEART REMOTE DEVICE CHECK
Date Time Interrogation Session: 20190530100751
MDC IDC PG IMPLANT DT: 20180124

## 2018-02-07 ENCOUNTER — Ambulatory Visit (INDEPENDENT_AMBULATORY_CARE_PROVIDER_SITE_OTHER): Payer: Federal, State, Local not specified - PPO | Admitting: *Deleted

## 2018-02-07 DIAGNOSIS — I639 Cerebral infarction, unspecified: Secondary | ICD-10-CM

## 2018-02-08 NOTE — Progress Notes (Signed)
Carelink Summary Report / Loop Recorder 

## 2018-03-08 LAB — CUP PACEART REMOTE DEVICE CHECK
Implantable Pulse Generator Implant Date: 20180124
MDC IDC SESS DTM: 20190702121128

## 2018-03-14 ENCOUNTER — Ambulatory Visit (INDEPENDENT_AMBULATORY_CARE_PROVIDER_SITE_OTHER): Payer: Federal, State, Local not specified - PPO | Admitting: *Deleted

## 2018-03-14 DIAGNOSIS — I639 Cerebral infarction, unspecified: Secondary | ICD-10-CM

## 2018-03-14 NOTE — Progress Notes (Signed)
Carelink Summary Report / Loop Recorder 

## 2018-03-24 ENCOUNTER — Telehealth: Payer: Self-pay | Admitting: Cardiology

## 2018-03-24 NOTE — Telephone Encounter (Signed)
LMOVM requesting that pt send manual transmission b/c home monitor has not updated in at least 14 days.    

## 2018-03-30 ENCOUNTER — Encounter: Payer: Self-pay | Admitting: Cardiology

## 2018-04-10 DEATH — deceased

## 2018-04-15 ENCOUNTER — Encounter: Payer: Self-pay | Admitting: *Deleted

## 2018-04-18 ENCOUNTER — Telehealth: Payer: Self-pay

## 2018-04-18 NOTE — Telephone Encounter (Signed)
LMOVM requesting that pt send manual transmission b/c home monitor has not updated in at least 14 days.    

## 2018-04-25 LAB — CUP PACEART REMOTE DEVICE CHECK
Implantable Pulse Generator Implant Date: 20180124
MDC IDC SESS DTM: 20190804123631

## 2018-04-27 ENCOUNTER — Encounter: Payer: Self-pay | Admitting: Cardiology
